# Patient Record
Sex: Female | Born: 1985 | Race: Black or African American | Hispanic: No | Marital: Single | Smoking: Current every day smoker
Health system: Southern US, Community
[De-identification: ages and names within clinical notes are randomized; demographics above are authoritative.]

## PROBLEM LIST (undated history)

## (undated) ENCOUNTER — Inpatient Hospital Stay: Payer: Self-pay

## (undated) DIAGNOSIS — Q433 Congenital malformations of intestinal fixation: Secondary | ICD-10-CM

## (undated) DIAGNOSIS — O26899 Other specified pregnancy related conditions, unspecified trimester: Secondary | ICD-10-CM

## (undated) DIAGNOSIS — F32A Depression, unspecified: Secondary | ICD-10-CM

## (undated) DIAGNOSIS — R0600 Dyspnea, unspecified: Secondary | ICD-10-CM

## (undated) DIAGNOSIS — O0993 Supervision of high risk pregnancy, unspecified, third trimester: Secondary | ICD-10-CM

## (undated) DIAGNOSIS — K8020 Calculus of gallbladder without cholecystitis without obstruction: Secondary | ICD-10-CM

## (undated) DIAGNOSIS — R109 Unspecified abdominal pain: Secondary | ICD-10-CM

## (undated) DIAGNOSIS — F1010 Alcohol abuse, uncomplicated: Secondary | ICD-10-CM

## (undated) DIAGNOSIS — I10 Essential (primary) hypertension: Secondary | ICD-10-CM

## (undated) DIAGNOSIS — I700 Atherosclerosis of aorta: Secondary | ICD-10-CM

## (undated) DIAGNOSIS — E669 Obesity, unspecified: Secondary | ICD-10-CM

## (undated) DIAGNOSIS — Z8482 Family history of sudden infant death syndrome: Secondary | ICD-10-CM

## (undated) DIAGNOSIS — K56609 Unspecified intestinal obstruction, unspecified as to partial versus complete obstruction: Secondary | ICD-10-CM

## (undated) DIAGNOSIS — 419620001 Death: Secondary | SNOMED CT

## (undated) DIAGNOSIS — K430 Incisional hernia with obstruction, without gangrene: Secondary | ICD-10-CM

## (undated) DIAGNOSIS — Z9889 Other specified postprocedural states: Secondary | ICD-10-CM

## (undated) DIAGNOSIS — F209 Schizophrenia, unspecified: Secondary | ICD-10-CM

## (undated) DIAGNOSIS — F329 Major depressive disorder, single episode, unspecified: Secondary | ICD-10-CM

## (undated) DIAGNOSIS — D649 Anemia, unspecified: Secondary | ICD-10-CM

## (undated) DIAGNOSIS — F419 Anxiety disorder, unspecified: Secondary | ICD-10-CM

## (undated) DIAGNOSIS — Z720 Tobacco use: Secondary | ICD-10-CM

## (undated) HISTORY — DX: Unspecified abdominal pain: R10.9

## (undated) HISTORY — DX: Supervision of high risk pregnancy, unspecified, third trimester: O09.93

## (undated) HISTORY — DX: Incisional hernia with obstruction, without gangrene: K43.0

## (undated) HISTORY — DX: Tobacco use: Z72.0

## (undated) HISTORY — PX: TUBAL LIGATION: SHX77

## (undated) HISTORY — DX: Unspecified intestinal obstruction, unspecified as to partial versus complete obstruction: K56.609

## (undated) HISTORY — DX: Death: 419620001

## (undated) HISTORY — DX: Other specified postprocedural states: Z98.890

## (undated) HISTORY — DX: Other specified pregnancy related conditions, unspecified trimester: O26.899

## (undated) HISTORY — DX: Obesity, unspecified: E66.9

## (undated) DEATH — deceased

---
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
## 2005-02-27 ENCOUNTER — Ambulatory Visit: Payer: Self-pay | Admitting: Family Medicine

## 2005-07-27 ENCOUNTER — Inpatient Hospital Stay: Payer: Self-pay

## 2006-01-20 ENCOUNTER — Emergency Department: Payer: Self-pay | Admitting: Emergency Medicine

## 2008-09-29 ENCOUNTER — Emergency Department: Payer: Self-pay | Admitting: Emergency Medicine

## 2010-08-10 ENCOUNTER — Emergency Department: Payer: Self-pay | Admitting: Emergency Medicine

## 2010-09-03 ENCOUNTER — Emergency Department: Payer: Self-pay | Admitting: Unknown Physician Specialty

## 2011-09-22 ENCOUNTER — Ambulatory Visit: Payer: Self-pay | Admitting: Family Medicine

## 2011-12-06 ENCOUNTER — Ambulatory Visit: Payer: Self-pay | Admitting: Family Medicine

## 2012-01-08 ENCOUNTER — Observation Stay: Payer: Self-pay

## 2012-01-08 LAB — URINALYSIS, COMPLETE
Bilirubin,UR: NEGATIVE
Blood: NEGATIVE
Glucose,UR: NEGATIVE mg/dL (ref 0–75)
Leukocyte Esterase: NEGATIVE
Nitrite: NEGATIVE
Protein: NEGATIVE
Squamous Epithelial: 3

## 2012-01-09 LAB — URINE CULTURE

## 2012-03-06 ENCOUNTER — Observation Stay: Payer: Self-pay

## 2012-03-08 ENCOUNTER — Observation Stay: Payer: Self-pay | Admitting: Obstetrics and Gynecology

## 2012-03-08 LAB — URINALYSIS, COMPLETE
Bacteria: NONE SEEN
Blood: NEGATIVE
Glucose,UR: NEGATIVE mg/dL (ref 0–75)
Ketone: NEGATIVE
Nitrite: NEGATIVE
Ph: 7 (ref 4.5–8.0)
Protein: NEGATIVE
RBC,UR: 15 /HPF (ref 0–5)
Squamous Epithelial: 6
WBC UR: 3 /HPF (ref 0–5)

## 2012-03-31 ENCOUNTER — Observation Stay: Payer: Self-pay

## 2012-04-01 ENCOUNTER — Inpatient Hospital Stay: Payer: Self-pay | Admitting: Obstetrics and Gynecology

## 2012-04-01 LAB — CBC WITH DIFFERENTIAL/PLATELET
Basophil #: 0.1 10*3/uL (ref 0.0–0.1)
Eosinophil #: 0.1 10*3/uL (ref 0.0–0.7)
Eosinophil %: 1.2 %
HCT: 34.5 % — ABNORMAL LOW (ref 35.0–47.0)
HGB: 12.1 g/dL (ref 12.0–16.0)
Lymphocyte #: 2.4 10*3/uL (ref 1.0–3.6)
Lymphocyte %: 26.7 %
MCH: 31.4 pg (ref 26.0–34.0)
MCHC: 35.2 g/dL (ref 32.0–36.0)
Monocyte %: 8.1 %
Neutrophil %: 63.4 %
Platelet: 202 10*3/uL (ref 150–440)
RBC: 3.87 10*6/uL (ref 3.80–5.20)
RDW: 14.7 % — ABNORMAL HIGH (ref 11.5–14.5)
WBC: 8.9 10*3/uL (ref 3.6–11.0)

## 2013-07-22 ENCOUNTER — Ambulatory Visit: Payer: Self-pay | Admitting: Family Medicine

## 2013-09-11 DIAGNOSIS — E669 Obesity, unspecified: Secondary | ICD-10-CM

## 2013-09-11 DIAGNOSIS — IMO0002 Reserved for concepts with insufficient information to code with codable children: Secondary | ICD-10-CM | POA: Insufficient documentation

## 2013-09-11 DIAGNOSIS — Z720 Tobacco use: Secondary | ICD-10-CM | POA: Insufficient documentation

## 2013-09-11 DIAGNOSIS — O0993 Supervision of high risk pregnancy, unspecified, third trimester: Secondary | ICD-10-CM | POA: Insufficient documentation

## 2013-09-11 HISTORY — DX: Obesity, unspecified: E66.9

## 2013-09-11 HISTORY — DX: Tobacco use: Z72.0

## 2013-09-11 HISTORY — DX: Supervision of high risk pregnancy, unspecified, third trimester: O09.93

## 2013-10-09 DIAGNOSIS — 419620001 Death: Secondary | SNOMED CT

## 2013-10-09 HISTORY — DX: Death: 419620001

## 2013-10-09 DEATH — deceased

## 2013-11-20 ENCOUNTER — Emergency Department: Payer: Self-pay | Admitting: Internal Medicine

## 2014-04-08 ENCOUNTER — Emergency Department: Payer: Self-pay | Admitting: Emergency Medicine

## 2015-02-16 NOTE — H&P (Signed)
L&D Evaluation:  History:   HPI 29yo G3P1101 (1 IUFD died at 458 mos due to abruption with first preg), then del NSVD viable female without problems of 2nd baby. 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 Presents today for "? ROM". Nitrazine neg on admission and cx exam of 3/80/vtx. After 2 hours monitoring, no cx change. NST reactive.    Presents with leaking fluid    Patient's Medical History Abruption with first preg and IUFD at 36 wks, Anciety, Depression, Anemia, BV, Candidiasis,    Medications Pre Natal Vitamins    Allergies NKDA    Social History tobacco  1ppd   ROS:   ROS All systems were reviewed.  HEENT, CNS, GI, GU, Respiratory, CV, Renal and Musculoskeletal systems were found to be normal.   Exam:   Vital Signs stable  BP stable    General no apparent distress    Mental Status clear    Chest clear    Heart normal sinus rhythm, no murmur/gallop/rubs    Estimated Fetal Weight Average for gestational age    Fetal Position vtx    Back no CVAT    Edema 1+    Reflexes 1+    Clonus negative    Mebranes Intact, No LOF noted. Perineum is dry.    FHT normal rate with no decels    Ucx regular, q 7 to 10 mins, mild    Skin dry    Lymph no lymphadenopathy    Other AFI done on US to see if there is fluid around the fetus. AFI of > 10 noted.  No fluid noted on vag exam and when palpating vtx, no fluid rushing out with balloting the vtx.   Impression:   Impression IUP at term   Plan:   Plan Dc home to rest    Comments FU this week at [facility]/ Return with ROM or any other changes.   Electronic Signatures: Sharee PimpleJones, Yailin Biederman W (CNM)  (Signed 23-Jun-13 20:22)  Authored: L&D Evaluation   Last Updated: 23-Jun-13 20:22 by Sharee PimpleJones, Chara Marquard W (CNM)

## 2015-05-05 ENCOUNTER — Encounter: Payer: Self-pay | Admitting: Emergency Medicine

## 2015-05-05 ENCOUNTER — Emergency Department
Admission: EM | Admit: 2015-05-05 | Discharge: 2015-05-05 | Disposition: A | Discharge status: Home / Self Care | Payer: Medicaid Other | Attending: Emergency Medicine | Admitting: Emergency Medicine

## 2015-05-05 ENCOUNTER — Emergency Department: Payer: Medicaid Other

## 2015-05-05 DIAGNOSIS — M545 Low back pain: Secondary | ICD-10-CM | POA: Diagnosis not present

## 2015-05-05 DIAGNOSIS — O039 Complete or unspecified spontaneous abortion without complication: Secondary | ICD-10-CM | POA: Diagnosis not present

## 2015-05-05 DIAGNOSIS — O9989 Other specified diseases and conditions complicating pregnancy, childbirth and the puerperium: Secondary | ICD-10-CM | POA: Diagnosis not present

## 2015-05-05 DIAGNOSIS — O209 Hemorrhage in early pregnancy, unspecified: Secondary | ICD-10-CM | POA: Diagnosis present

## 2015-05-05 DIAGNOSIS — Z3A12 12 weeks gestation of pregnancy: Secondary | ICD-10-CM | POA: Diagnosis not present

## 2015-05-05 DIAGNOSIS — F17210 Nicotine dependence, cigarettes, uncomplicated: Secondary | ICD-10-CM | POA: Insufficient documentation

## 2015-05-05 LAB — URINALYSIS COMPLETE WITH MICROSCOPIC (ARMC ONLY)
BILIRUBIN URINE: NEGATIVE
Bacteria, UA: NONE SEEN
Glucose, UA: 50 mg/dL — AB
Hgb urine dipstick: 3 — AB
KETONES UR: NEGATIVE mg/dL
NITRITE: NEGATIVE
PROTEIN: 100 mg/dL — AB
RBC / HPF: NONE SEEN RBC/hpf (ref 0–5)
Specific Gravity, Urine: 1.029 (ref 1.005–1.030)
pH: 6 (ref 5.0–8.0)

## 2015-05-05 LAB — COMPREHENSIVE METABOLIC PANEL
ALK PHOS: 45 U/L (ref 38–126)
ALT: 21 U/L (ref 14–54)
AST: 25 U/L (ref 15–41)
Albumin: 3.5 g/dL (ref 3.5–5.0)
Anion gap: 7 (ref 5–15)
BUN: 5 mg/dL — ABNORMAL LOW (ref 6–20)
CO2: 20 mmol/L — AB (ref 22–32)
CREATININE: 0.5 mg/dL (ref 0.44–1.00)
Calcium: 9.2 mg/dL (ref 8.9–10.3)
Chloride: 110 mmol/L (ref 101–111)
GFR calc non Af Amer: >60 mL/min (ref >60)
Glucose, Bld: 106 mg/dL — ABNORMAL HIGH (ref 65–99)
Potassium: 3.9 mmol/L (ref 3.5–5.1)
Sodium: 137 mmol/L (ref 135–145)
TOTAL PROTEIN: 6.8 g/dL (ref 6.5–8.1)
Total Bilirubin: 0.5 mg/dL (ref 0.3–1.2)

## 2015-05-05 LAB — CBC WITH DIFFERENTIAL/PLATELET
BASOS ABS: 0.1 10*3/uL (ref 0–0.1)
Basophils Relative: 1 %
EOS PCT: 2 %
Eosinophils Absolute: 0.2 10*3/uL (ref 0–0.7)
HCT: 38.3 % (ref 35.0–47.0)
Hemoglobin: 12.4 g/dL (ref 12.0–16.0)
LYMPHS ABS: 3 10*3/uL (ref 1.0–3.6)
Lymphocytes Relative: 32 %
MCH: 26.9 pg (ref 26.0–34.0)
MCHC: 32.3 g/dL (ref 32.0–36.0)
MCV: 83.3 fL (ref 80.0–100.0)
Monocytes Absolute: 0.8 10*3/uL (ref 0.2–0.9)
Monocytes Relative: 8 %
Neutro Abs: 5.4 10*3/uL (ref 1.4–6.5)
Neutrophils Relative %: 57 %
Platelets: 315 10*3/uL (ref 150–440)
RBC: 4.6 MIL/uL (ref 3.80–5.20)
RDW: 13.7 % (ref 11.5–14.5)
WBC: 9.3 10*3/uL (ref 3.6–11.0)

## 2015-05-05 LAB — HCG, QUANTITATIVE, PREGNANCY: HCG, BETA CHAIN, QUANT, S: 3694 m[IU]/mL — AB (ref <5)

## 2015-05-05 MED ORDER — IBUPROFEN 800 MG PO TABS: 800.0000 mg | ORAL_TABLET | Freq: Three times a day (TID) | ORAL | Status: DC | PRN

## 2015-05-05 MED ORDER — OXYCODONE-ACETAMINOPHEN 5-325 MG PO TABS: 1.0000 | ORAL_TABLET | Freq: Four times a day (QID) | ORAL | Status: DC | PRN

## 2015-05-05 NOTE — Discharge Instructions (Signed)
Miscarriage A miscarriage is the sudden loss of an unborn baby (fetus) before the 20th week of pregnancy. Most miscarriages happen in the first 3 months of pregnancy. Sometimes, it happens before a woman even knows she is pregnant. A miscarriage is also called a "spontaneous miscarriage" or "early pregnancy loss." Having a miscarriage can be an emotional experience. Talk with your caregiver about any questions you may have about miscarrying, the grieving process, and your future pregnancy plans. CAUSES   Problems with the fetal chromosomes that make it impossible for the baby to develop normally. Problems with the baby's genes or chromosomes are most often the result of errors that occur, by chance, as the embryo divides and grows. The problems are not inherited from the parents.  Infection of the cervix or uterus.   Hormone problems.   Problems with the cervix, such as having an incompetent cervix. This is when the tissue in the cervix is not strong enough to hold the pregnancy.   Problems with the uterus, such as an abnormally shaped uterus, uterine fibroids, or congenital abnormalities.   Certain medical conditions.   Smoking, drinking alcohol, or taking illegal drugs.   Trauma.  Often, the cause of a miscarriage is unknown.  SYMPTOMS   Vaginal bleeding or spotting, with or without cramps or pain.  Pain or cramping in the abdomen or lower back.  Passing fluid, tissue, or blood clots from the vagina. DIAGNOSIS  Your caregiver will perform a physical exam. You may also have an ultrasound to confirm the miscarriage. Blood or urine tests may also be ordered. TREATMENT   Sometimes, treatment is not necessary if you naturally pass all the fetal tissue that was in the uterus. If some of the fetus or placenta remains in the body (incomplete miscarriage), tissue left behind may become infected and must be removed. Usually, a dilation and curettage (D and C) procedure is performed.  During a D and C procedure, the cervix is widened (dilated) and any remaining fetal or placental tissue is gently removed from the uterus.  Antibiotic medicines are prescribed if there is an infection. Other medicines may be given to reduce the size of the uterus (contract) if there is a lot of bleeding.  If you have Rh negative blood and your baby was Rh positive, you will need a Rh immunoglobulin shot. This shot will protect any future baby from having Rh blood problems in future pregnancies. HOME CARE INSTRUCTIONS   Your caregiver may order bed rest or may allow you to continue light activity. Resume activity as directed by your caregiver.  Have someone help with home and family responsibilities during this time.   Keep track of the number of sanitary pads you use each day and how soaked (saturated) they are. Write down this information.   Do not use tampons. Do not douche or have sexual intercourse until approved by your caregiver.   Only take over-the-counter or prescription medicines for pain or discomfort as directed by your caregiver.   Do not take aspirin. Aspirin can cause bleeding.   Keep all follow-up appointments with your caregiver.   If you or your partner have problems with grieving, talk to your caregiver or seek counseling to help cope with the pregnancy loss. Allow enough time to grieve before trying to get pregnant again.  SEEK IMMEDIATE MEDICAL CARE IF:   You have severe cramps or pain in your back or abdomen.  You have a fever.  You pass large blood clots (walnut-sized   or larger) ortissue from your vagina. Save any tissue for your caregiver to inspect.   Your bleeding increases.   You have a thick, bad-smelling vaginal discharge.  You become lightheaded, weak, or you faint.   You have chills.  MAKE SURE YOU:  Understand these instructions.  Will watch your condition.  Will get help right away if you are not doing well or get  worse. Document Released: 03/21/2001 Document Revised: 01/20/2013 Document Reviewed: 11/14/2011 ExitCare Patient Information 2015 [employer]. This information is not intended to replace advice given to you by your health care provider. Make sure you discuss any questions you have with your health care provider.  

## 2015-05-05 NOTE — ED Provider Notes (Signed)
[facility] Emergency Department Provider Note     Time seen: ----------------------------------------- 2:49 PM on 05/05/2015 -----------------------------------------    I have reviewed the triage vital signs and the nursing notes.   HISTORY  Chief Complaint Vaginal Bleeding    HPI [name] is a 29 y.o. female who presents to ER for vaginal bleeding and pregnancy. States she is around [redacted] weeks pregnant, this is her fifth pregnancy, has never had any bleeding before. Started having large amounts of bleeding today with passing clots and having lower abdominal and back pain.Chills or other complaints. Pain in the lower abdomen described as cramping and mild to moderate this time.   No past medical history on file.  There are no active problems to display for this patient.   No past surgical history on file.  Allergies Review of patient's allergies indicates not on file.  Social History History  Substance Use Topics  . Smoking status: Current Every Day Smoker  . Smokeless tobacco: Not on file  . Alcohol Use: Yes    Review of Systems Constitutional: Negative for fever. Eyes: Negative for visual changes. ENT: Negative for sore throat. Cardiovascular: Negative for chest pain. Respiratory: Negative for shortness of breath. Gastrointestinal: Positive for abdominal pain, pelvic pain. Genitourinary: Negative for dysuria. Positive for heavy vaginal bleeding Musculoskeletal: Positive for low back pain Skin: Negative for rash. Neurological: Negative for headaches, focal weakness or numbness.  10-point ROS otherwise negative.  ____________________________________________   PHYSICAL EXAM:  VITAL SIGNS: ED Triage Vitals  Enc Vitals Group     BP 05/05/15 1429 107/65 mmHg     Pulse Rate 05/05/15 1429 98     Resp 05/05/15 1429 18     Temp 05/05/15 1429 98.6 F (37 C)     Temp src --      SpO2 05/05/15 1429 97 %     Weight 05/05/15  1429 270 lb (122.471 kg)     Height 05/05/15 1429  (1.651 m)     Head Cir --      Peak Flow --      Pain Score 05/05/15 1429 0     Pain Loc --      Pain Edu? --      Excl. in GC? --     Constitutional: Alert and oriented. Well appearing and in no distress. Eyes: Conjunctivae are normal. PERRL. Normal extraocular movements. ENT   Head: Normocephalic and atraumatic.   Nose: No congestion/rhinnorhea.   Mouth/Throat: Mucous membranes are moist.   Neck: No stridor. Cardiovascular: Normal rate, regular rhythm. Normal and symmetric distal pulses are present in all extremities. No murmurs, rubs, or gallops. Respiratory: Normal respiratory effort without tachypnea nor retractions. Breath sounds are clear and equal bilaterally. No wheezes/rales/rhonchi. Gastrointestinal: Soft and nontender. No distention. No abdominal bruits. There is no CVA tenderness. Musculoskeletal: Nontender with normal range of motion in all extremities. No joint effusions.  No lower extremity tenderness nor edema. Neurologic:  Normal speech and language. No gross focal neurologic deficits are appreciated. Speech is normal. No gait instability. Skin:  Skin is warm, dry and intact. No rash noted. Psychiatric: Mood and affect are normal. Speech and behavior are normal. Patient exhibits appropriate insight and judgment. ____________________________________________  ED COURSE:  Pertinent labs & imaging results that were available during my care of the patient were reviewed by me and considered in my medical decision making (see chart for details). Patient is in no acute distress, we'll ultrasound and evaluate  her pregnancy. Likely miscarriage ____________________________________________    LABS (pertinent positives/negatives)  Labs Reviewed  HCG, QUANTITATIVE, PREGNANCY - Abnormal; Notable for the following:    hCG, Beta Chain, Quant, S 3694 (*)    All other components within normal limits  URINALYSIS  COMPLETEWITH MICROSCOPIC ([facility] ONLY) - Abnormal; Notable for the following:    Color, Urine COLORLESS (*)    APPearance CLEAR (*)    Glucose, UA 50 (*)    Specific Gravity, Urine 1.051 (*)    Hgb urine dipstick 1+ (*)    All other components within normal limits  COMPREHENSIVE METABOLIC PANEL - Abnormal; Notable for the following:    CO2 20 (*)    Glucose, Bld 106 (*)    BUN 5 (*)    All other components within normal limits  CBC WITH DIFFERENTIAL/PLATELET    RADIOLOGY  Ultrasound OB limited IMPRESSION: 1. The gestational sac is noted within the lower uterine segment/cervical region. No cardiac activity is identified within the embryo. Findings meet definitive criteria for failed pregnancy. This follows SRU consensus guidelines: Diagnostic Criteria for Nonviable Pregnancy Early in the First Trimester. Macy Mis J Med [phone]. 2. Small subchorionic hemorrhage. ____________________________________________  FINAL ASSESSMENT AND PLAN  Miscarriage  Plan: Patient with labs and imaging as dictated above. Patient will be referred to OB/GYN follow-up. Likely pass contents spontaneously. Will be discharged with pain medication to take as needed.   Emily Filbert, MD   Emily Filbert, MD 05/05/15 [phone]

## 2015-05-05 NOTE — ED Notes (Signed)
Pt states she is about 12-[redacted] weeks pregnant, states she started having large amt of vaginal bleeding today, passing large clots, also having pain in lower abd. And back

## 2016-01-19 LAB — OB RESULTS CONSOLE RUBELLA ANTIBODY, IGM: RUBELLA: IMMUNE

## 2016-01-19 LAB — OB RESULTS CONSOLE RPR: RPR: NONREACTIVE

## 2016-01-19 LAB — OB RESULTS CONSOLE GC/CHLAMYDIA
CHLAMYDIA, DNA PROBE: NEGATIVE
GC PROBE AMP, GENITAL: NEGATIVE

## 2016-01-19 LAB — OB RESULTS CONSOLE HEPATITIS B SURFACE ANTIGEN: Hepatitis B Surface Ag: NEGATIVE

## 2016-01-19 LAB — OB RESULTS CONSOLE HIV ANTIBODY (ROUTINE TESTING): HIV: NONREACTIVE

## 2016-01-19 LAB — OB RESULTS CONSOLE VARICELLA ZOSTER ANTIBODY, IGG: VARICELLA IGG: IMMUNE

## 2016-02-28 ENCOUNTER — Observation Stay
Admission: EM | Admit: 2016-02-28 | Discharge: 2016-02-28 | Disposition: A | Discharge status: Home / Self Care | Payer: Medicaid Other | Attending: Obstetrics and Gynecology | Admitting: Obstetrics and Gynecology

## 2016-02-28 ENCOUNTER — Encounter: Payer: Self-pay | Admitting: *Deleted

## 2016-02-28 DIAGNOSIS — F17210 Nicotine dependence, cigarettes, uncomplicated: Secondary | ICD-10-CM | POA: Insufficient documentation

## 2016-02-28 DIAGNOSIS — R300 Dysuria: Secondary | ICD-10-CM | POA: Insufficient documentation

## 2016-02-28 DIAGNOSIS — B9689 Other specified bacterial agents as the cause of diseases classified elsewhere: Secondary | ICD-10-CM | POA: Insufficient documentation

## 2016-02-28 DIAGNOSIS — B373 Candidiasis of vulva and vagina: Secondary | ICD-10-CM | POA: Insufficient documentation

## 2016-02-28 DIAGNOSIS — R109 Unspecified abdominal pain: Secondary | ICD-10-CM

## 2016-02-28 DIAGNOSIS — O26899 Other specified pregnancy related conditions, unspecified trimester: Secondary | ICD-10-CM

## 2016-02-28 DIAGNOSIS — O9989 Other specified diseases and conditions complicating pregnancy, childbirth and the puerperium: Secondary | ICD-10-CM | POA: Insufficient documentation

## 2016-02-28 DIAGNOSIS — Z3A24 24 weeks gestation of pregnancy: Secondary | ICD-10-CM | POA: Insufficient documentation

## 2016-02-28 DIAGNOSIS — O23592 Infection of other part of genital tract in pregnancy, second trimester: Secondary | ICD-10-CM | POA: Insufficient documentation

## 2016-02-28 DIAGNOSIS — O99332 Smoking (tobacco) complicating pregnancy, second trimester: Secondary | ICD-10-CM | POA: Insufficient documentation

## 2016-02-28 DIAGNOSIS — O98812 Other maternal infectious and parasitic diseases complicating pregnancy, second trimester: Principal | ICD-10-CM | POA: Insufficient documentation

## 2016-02-28 HISTORY — DX: Other specified pregnancy related conditions, unspecified trimester: R10.9

## 2016-02-28 HISTORY — DX: Other specified pregnancy related conditions, unspecified trimester: O26.899

## 2016-02-28 LAB — URINALYSIS COMPLETE WITH MICROSCOPIC (ARMC ONLY)
Bilirubin Urine: NEGATIVE
Glucose, UA: NEGATIVE mg/dL
HGB URINE DIPSTICK: NEGATIVE
Nitrite: NEGATIVE
Protein, ur: 30 mg/dL — AB
Specific Gravity, Urine: 1.027 (ref 1.005–1.030)
pH: 6 (ref 5.0–8.0)

## 2016-02-28 LAB — WET PREP, GENITAL
SPERM: NONE SEEN
TRICH WET PREP: NONE SEEN

## 2016-02-28 LAB — CHLAMYDIA/NGC RT PCR (ARMC ONLY)
CHLAMYDIA TR: NOT DETECTED
N gonorrhoeae: NOT DETECTED

## 2016-02-28 MED ORDER — METRONIDAZOLE 0.75 % VA GEL: 1.0000 | Freq: Every day | VAGINAL | Status: DC

## 2016-02-28 MED ORDER — FLUCONAZOLE 150 MG PO TABS: 150.0000 mg | ORAL_TABLET | Freq: Every day | ORAL | Status: DC

## 2016-02-28 MED ORDER — FLUCONAZOLE 50 MG PO TABS
150.0000 mg | ORAL_TABLET | Freq: Once | ORAL | Status: AC
  Administered 2016-02-28: 150 mg via ORAL
  Filled 2016-02-28: qty 3

## 2016-02-28 NOTE — Discharge Summary (Signed)
Discharge instructions reviewed with patient including follow up appointment, prescriptions, and when to seek medical attention. All questions answered. Patient ambulatory with steady gait, no signs of distress observed at time of discharge.

## 2016-02-28 NOTE — OB Triage Provider Note (Signed)
History     CSN: [id number]  Arrival date and time: 02/28/16 1456   None     No chief complaint on file.  HPI  [name] is a 30 yo [id number] at 24+4 weeks by LMP of 09/09/15, with an EDD of 06/15/16.  She presents today with c/o "vaginal burning and burning when I pee" and "feeling like something is right there in my vagina."  She denies vaginal bleeding, but does report tan discharge when she wipes.  She reports some cramping/ctxs.  She endorses good fetal movement and occasional BH ctxs. She reports last bowel movement today and denies constipation, or diarrhea.    Significant OB hx:  02/25/04: IUFD at 8 months for possible placenta abruption 24-Feb-2005: NSVD 6#7 oz - no complications with pregnancy or delivery 02-25-12: NSVD 6#7oz  - infant died at 3 months likely r/t SIDS 02-24-14: NSVD 7#15 - gestational DM 02/25/2015: SAB  History reviewed. No pertinent past medical history.  History reviewed. No pertinent past surgical history.  History reviewed. No pertinent family history.  Social History  Substance Use Topics  . Smoking status: Current Every Day Smoker  . Smokeless tobacco: None  . Alcohol Use: Yes    Allergies: Allergies not on file  Prescriptions prior to admission  Medication Sig Dispense Refill Last Dose  . ibuprofen (ADVIL,MOTRIN) 800 MG tablet Take 1 tablet (800 mg total) by mouth every 8 (eight) hours as needed. 30 tablet 0   . oxyCODONE-acetaminophen (ROXICET) 5-325 MG per tablet Take 1 tablet by mouth every 6 (six) hours as needed. 20 tablet 0     Review of Systems  Constitutional: Negative.   HENT: Negative.   Eyes: Negative.   Respiratory: Negative.   Cardiovascular: Negative.   Gastrointestinal: Positive for abdominal pain.       Ctxs  Genitourinary:       Vaginal burning +tan/white discharge  Musculoskeletal: Negative.   Skin: Negative.   Neurological: Negative.   Endo/Heme/Allergies: Negative.    Physical Exam   Blood pressure 112/64, pulse 87,  temperature 98.1 F (36.7 C), temperature source Oral, resp. rate 18, height  (1.626 m), weight 125.193 kg (276 lb), last menstrual period 09/09/2015, unknown if currently breastfeeding.  Physical Exam  Constitutional: She is oriented to person, place, and time. She appears well-developed and well-nourished.  Cardiovascular: Normal rate and regular rhythm.   Respiratory: Effort normal and breath sounds normal.  GI: Soft.  Genitourinary: Uterus normal.  Gravid, nontender Sterile speculum exam: copious thick white discharge noted in vaginal vault  Cervix appears closed  Dilation: Closed Effacement (%): Thick Exam by:: Joelene Millin, CNM  Musculoskeletal: Normal range of motion.  Neurological: She is alert and oriented to person, place, and time.  Skin: Skin is warm and dry.   Fetal monitoring: 150 bpm/moderate variability  Toco: quiet    Procedures   Assessment and Plan  IUP at 24+4 weeks Vaginal burning in 2nd trimester Vaginal discharge in pregnancy   - Wet prep pending  - GC/CT pending Dysuria in 2nd trimester  - Urinalysis and Urine culture pending Continuous Toco NST Oral hydration until labs result  Karena Addison 02/28/2016, 4:59 PM   Reassessment at 1800: Vulvovaginal candidiasis:  - Diflucan  x 1 now  - Prescription printed for patient to have 1 more tablet to repeat in 1 week if she is still having symptoms  Bacterial vaginosis:   - Metrogel 1 applicator at bedtime x 7 nights   - Prescription  printed for patient Fetal monitoring reassuring for current gestational age  D/C home  Preterm labor and warning s/s reviewed FKC's daily [facility] at [facility] in 1 week for f/u evaluation   Dr. Dalbert GarnetBeasley aware and agrees with plan of care   Carlean JewsMeredith Herndon Grill, CNM

## 2016-02-28 NOTE — Plan of Care (Signed)
Pt seen by meredith sigmon,cnm./ sterile speculum exam done. Will give pt diflucan as ordered and pt to take prescription and get filled for 2nd dose tomorrow. Fetal heart tones obtained. Unable to keep constant tracing due to gestational age and pt's habitus. Will d/c pt home with d/c instructions.

## 2016-03-01 LAB — URINE CULTURE

## 2016-05-11 ENCOUNTER — Inpatient Hospital Stay
Admission: EM | Admit: 2016-05-11 | Discharge: 2016-05-11 | Disposition: A | Discharge status: Home / Self Care | Payer: Medicaid Other | Attending: Obstetrics and Gynecology | Admitting: Obstetrics and Gynecology

## 2016-05-11 ENCOUNTER — Encounter: Payer: Self-pay | Admitting: *Deleted

## 2016-05-11 DIAGNOSIS — R109 Unspecified abdominal pain: Secondary | ICD-10-CM | POA: Insufficient documentation

## 2016-05-11 DIAGNOSIS — Z3A35 35 weeks gestation of pregnancy: Secondary | ICD-10-CM | POA: Insufficient documentation

## 2016-05-11 DIAGNOSIS — M545 Low back pain: Secondary | ICD-10-CM | POA: Diagnosis not present

## 2016-05-11 DIAGNOSIS — O9989 Other specified diseases and conditions complicating pregnancy, childbirth and the puerperium: Secondary | ICD-10-CM | POA: Diagnosis not present

## 2016-05-11 HISTORY — DX: Anxiety disorder, unspecified: F41.9

## 2016-05-11 HISTORY — DX: Major depressive disorder, single episode, unspecified: F32.9

## 2016-05-11 HISTORY — DX: Depression, unspecified: F32.A

## 2016-05-11 NOTE — Discharge Instructions (Signed)
Keep all scheduled appointments.  Call office with any concerns.  Fetal kick count twice a day.

## 2016-05-11 NOTE — Discharge Summary (Signed)
  Patient ID: [name], female   DOB: 1986-04-04, 30 y.o.   MRN: [id number] [name] 27-Jun-1986 G6 P2 [redacted]w[redacted]d presents for cramping  No LOF , no vaginal bleeding ,. Good fetal movt  O;BP (!) 109/48   Pulse 88   Temp 98 F (36.7 C) (Oral)   Resp 18   LMP 09/09/2015  ABDsoft NT  CX closed by RN  NST130 no decels , reactive NST  Labs: none A: cramping 35 week , no labor . Reassuring fetal monitoring  P:cont care at [facility]

## 2016-05-11 NOTE — OB Triage Note (Signed)
[name] pt. To [facility] this morning for low abd cramping and sides.  Right leg  is sore/cramping and low back.  States "I lost my mucous plug last night".

## 2016-06-12 ENCOUNTER — Inpatient Hospital Stay: Payer: Medicaid Other | Admitting: Anesthesiology

## 2016-06-12 ENCOUNTER — Inpatient Hospital Stay
Admission: EM | Admit: 2016-06-12 | Discharge: 2016-06-15 | DRG: 765 | Disposition: A | Discharge status: Home / Self Care | Payer: Medicaid Other | Attending: Obstetrics and Gynecology | Admitting: Obstetrics and Gynecology

## 2016-06-12 ENCOUNTER — Encounter
Admission: EM | Disposition: A | Discharge status: Home / Self Care | Payer: Self-pay | Source: Home / Self Care | Attending: Obstetrics and Gynecology

## 2016-06-12 DIAGNOSIS — O99314 Alcohol use complicating childbirth: Secondary | ICD-10-CM | POA: Diagnosis present

## 2016-06-12 DIAGNOSIS — Z302 Encounter for sterilization: Secondary | ICD-10-CM

## 2016-06-12 DIAGNOSIS — O99334 Smoking (tobacco) complicating childbirth: Secondary | ICD-10-CM | POA: Diagnosis present

## 2016-06-12 DIAGNOSIS — Z3A36 36 weeks gestation of pregnancy: Secondary | ICD-10-CM

## 2016-06-12 DIAGNOSIS — D62 Acute posthemorrhagic anemia: Secondary | ICD-10-CM | POA: Diagnosis present

## 2016-06-12 DIAGNOSIS — F329 Major depressive disorder, single episode, unspecified: Secondary | ICD-10-CM | POA: Diagnosis present

## 2016-06-12 DIAGNOSIS — F17210 Nicotine dependence, cigarettes, uncomplicated: Secondary | ICD-10-CM | POA: Diagnosis present

## 2016-06-12 DIAGNOSIS — Z6841 Body Mass Index (BMI) 40.0 and over, adult: Secondary | ICD-10-CM | POA: Diagnosis not present

## 2016-06-12 DIAGNOSIS — O99344 Other mental disorders complicating childbirth: Secondary | ICD-10-CM | POA: Diagnosis present

## 2016-06-12 DIAGNOSIS — O9902 Anemia complicating childbirth: Secondary | ICD-10-CM | POA: Diagnosis present

## 2016-06-12 DIAGNOSIS — Z9889 Other specified postprocedural states: Secondary | ICD-10-CM

## 2016-06-12 LAB — CBC
HCT: 32.7 % — ABNORMAL LOW (ref 35.0–47.0)
Hemoglobin: 11 g/dL — ABNORMAL LOW (ref 12.0–16.0)
MCH: 26.1 pg (ref 26.0–34.0)
MCHC: 33.7 g/dL (ref 32.0–36.0)
MCV: 77.5 fL — AB (ref 80.0–100.0)
PLATELETS: 310 10*3/uL (ref 150–440)
RBC: 4.22 MIL/uL (ref 3.80–5.20)
RDW: 14.6 % — ABNORMAL HIGH (ref 11.5–14.5)
WBC: 11.3 10*3/uL — ABNORMAL HIGH (ref 3.6–11.0)

## 2016-06-12 SURGERY — Surgical Case
Anesthesia: General | Site: Abdomen | Wound class: Clean

## 2016-06-12 MED ORDER — ROCURONIUM BROMIDE 100 MG/10ML IV SOLN
INTRAVENOUS | Status: DC | PRN
Start: 2016-06-12 — End: 2016-06-12
  Administered 2016-06-12: 30 mg via INTRAVENOUS

## 2016-06-12 MED ORDER — LACTATED RINGERS IV BOLUS (SEPSIS): 500.0000 mL | Freq: Once | INTRAVENOUS | Status: DC

## 2016-06-12 MED ORDER — SUCCINYLCHOLINE CHLORIDE 20 MG/ML IJ SOLN
INTRAMUSCULAR | Status: DC | PRN
  Administered 2016-06-12: 100 mg via INTRAVENOUS

## 2016-06-12 MED ORDER — NEOSTIGMINE METHYLSULFATE 10 MG/10ML IV SOLN
INTRAVENOUS | Status: DC | PRN
  Administered 2016-06-12: 5 mg via INTRAVENOUS

## 2016-06-12 MED ORDER — PROMETHAZINE HCL 25 MG/ML IJ SOLN: 6.2500 mg | INTRAMUSCULAR | Status: DC | PRN

## 2016-06-12 MED ORDER — FENTANYL CITRATE (PF) 100 MCG/2ML IJ SOLN
25.0000 ug | INTRAMUSCULAR | Status: DC | PRN
Start: 2016-06-12 — End: 2016-06-15
  Administered 2016-06-12 – 2016-06-13 (×3): 50 ug via INTRAVENOUS
  Filled 2016-06-12: qty 2

## 2016-06-12 MED ORDER — OXYTOCIN 40 UNITS IN LACTATED RINGERS INFUSION - SIMPLE MED
INTRAVENOUS | Status: DC | PRN
  Administered 2016-06-12: 600 mL via INTRAVENOUS

## 2016-06-12 MED ORDER — FENTANYL CITRATE (PF) 100 MCG/2ML IJ SOLN
INTRAMUSCULAR | Status: DC | PRN
  Administered 2016-06-12 (×2): 100 ug via INTRAVENOUS

## 2016-06-12 MED ORDER — DIPHENHYDRAMINE HCL 50 MG/ML IJ SOLN: 12.5000 mg | Freq: Four times a day (QID) | INTRAMUSCULAR | Status: DC | PRN

## 2016-06-12 MED ORDER — SODIUM CHLORIDE 0.9% FLUSH: 9.0000 mL | INTRAVENOUS | Status: DC | PRN

## 2016-06-12 MED ORDER — DEXAMETHASONE SODIUM PHOSPHATE 10 MG/ML IJ SOLN
INTRAMUSCULAR | Status: DC | PRN
  Administered 2016-06-12: 10 mg via INTRAVENOUS

## 2016-06-12 MED ORDER — ONDANSETRON HCL 4 MG/2ML IJ SOLN
INTRAMUSCULAR | Status: DC | PRN
  Administered 2016-06-12: 4 mg via INTRAVENOUS

## 2016-06-12 MED ORDER — SODIUM CHLORIDE 0.9 % IV SOLN
2.0000 g | Freq: Once | INTRAVENOUS | Status: AC
  Administered 2016-06-12: 2 g via INTRAVENOUS
  Filled 2016-06-12: qty 2000

## 2016-06-12 MED ORDER — MORPHINE SULFATE 2 MG/ML IV SOLN
INTRAVENOUS | Status: DC
  Administered 2016-06-13: 15.28 mg via INTRAVENOUS
  Administered 2016-06-13: 1 mg via INTRAVENOUS
  Administered 2016-06-13: 14 mg via INTRAVENOUS
  Administered 2016-06-13: 14 mL via INTRAVENOUS
  Filled 2016-06-12: qty 25

## 2016-06-12 MED ORDER — GLYCOPYRROLATE 0.2 MG/ML IJ SOLN
INTRAMUSCULAR | Status: DC | PRN
  Administered 2016-06-12: 1 mg via INTRAVENOUS

## 2016-06-12 MED ORDER — LIDOCAINE HCL (CARDIAC) 20 MG/ML IV SOLN
INTRAVENOUS | Status: DC | PRN
  Administered 2016-06-12: 100 mg via INTRAVENOUS

## 2016-06-12 MED ORDER — FENTANYL CITRATE (PF) 100 MCG/2ML IJ SOLN
INTRAMUSCULAR | Status: AC
Start: 2016-06-12 — End: 2016-06-12
  Administered 2016-06-12: 50 ug via INTRAVENOUS
  Filled 2016-06-12: qty 2

## 2016-06-12 MED ORDER — NALOXONE HCL 0.4 MG/ML IJ SOLN: 0.4000 mg | INTRAMUSCULAR | Status: DC | PRN

## 2016-06-12 MED ORDER — ONDANSETRON HCL 4 MG/2ML IJ SOLN: 4.0000 mg | Freq: Four times a day (QID) | INTRAMUSCULAR | Status: DC | PRN

## 2016-06-12 MED ORDER — CEFAZOLIN SODIUM-DEXTROSE 2-4 GM/100ML-% IV SOLN
INTRAVENOUS | Status: AC
  Filled 2016-06-12: qty 100

## 2016-06-12 MED ORDER — PROPOFOL 10 MG/ML IV BOLUS
INTRAVENOUS | Status: DC | PRN
  Administered 2016-06-12: 200 mg via INTRAVENOUS

## 2016-06-12 MED ORDER — SODIUM CHLORIDE FLUSH 0.9 % IV SOLN
INTRAVENOUS | Status: AC
  Filled 2016-06-12: qty 10

## 2016-06-12 MED ORDER — LACTATED RINGERS IV SOLN
INTRAVENOUS | Status: DC
  Administered 2016-06-12: 21:00:00 via INTRAVENOUS

## 2016-06-12 MED ORDER — SODIUM CHLORIDE 0.9 % IV SOLN
INTRAVENOUS | Status: AC
  Filled 2016-06-12: qty 2000

## 2016-06-12 MED ORDER — DIPHENHYDRAMINE HCL 12.5 MG/5ML PO ELIX
12.5000 mg | ORAL_SOLUTION | Freq: Four times a day (QID) | ORAL | Status: DC | PRN
  Filled 2016-06-12: qty 5

## 2016-06-12 MED ORDER — CEFAZOLIN SODIUM-DEXTROSE 2-3 GM-% IV SOLR
INTRAVENOUS | Status: DC | PRN
  Administered 2016-06-12: 2 g via INTRAVENOUS

## 2016-06-12 SURGICAL SUPPLY — 26 items
BARRIER ADHS 3X4 INTERCEED (GAUZE/BANDAGES/DRESSINGS) ×3 IMPLANT
CANISTER SUCT 3000ML (MISCELLANEOUS) ×3 IMPLANT
CATH KIT ON-Q SILVERSOAK 5IN (CATHETERS) ×6 IMPLANT
CHLORAPREP W/TINT 26ML (MISCELLANEOUS) ×3 IMPLANT
DRSG TELFA 3X8 NADH (GAUZE/BANDAGES/DRESSINGS) ×3 IMPLANT
ELECT CAUTERY BLADE 6.4 (BLADE) ×3 IMPLANT
ELECT REM PT RETURN 9FT ADLT (ELECTROSURGICAL) ×3
ELECTRODE REM PT RTRN 9FT ADLT (ELECTROSURGICAL) ×1 IMPLANT
GAUZE SPONGE 4X4 12PLY STRL (GAUZE/BANDAGES/DRESSINGS) ×3 IMPLANT
GLOVE BIO SURGEON STRL SZ8 (GLOVE) ×3 IMPLANT
GOWN STRL REUS W/ TWL LRG LVL3 (GOWN DISPOSABLE) ×2 IMPLANT
GOWN STRL REUS W/ TWL XL LVL3 (GOWN DISPOSABLE) ×1 IMPLANT
GOWN STRL REUS W/TWL LRG LVL3 (GOWN DISPOSABLE) ×4
GOWN STRL REUS W/TWL XL LVL3 (GOWN DISPOSABLE) ×2
NS IRRIG 1000ML POUR BTL (IV SOLUTION) ×3 IMPLANT
PACK C SECTION AR (MISCELLANEOUS) ×3 IMPLANT
PAD OB MATERNITY 4.3X12.25 (PERSONAL CARE ITEMS) ×3 IMPLANT
PAD PREP 24X41 OB/GYN DISP (PERSONAL CARE ITEMS) ×3 IMPLANT
STAPLER INSORB 30 2030 C-SECTI (MISCELLANEOUS) ×3 IMPLANT
STRAP SAFETY BODY (MISCELLANEOUS) ×3 IMPLANT
SUT CHROMIC 1 CTX 36 (SUTURE) ×9 IMPLANT
SUT PLAIN 2 0 XLH (SUTURE) ×6 IMPLANT
SUT PLAIN GUT 0 (SUTURE) ×9 IMPLANT
SUT VIC AB 0 CT1 36 (SUTURE) ×6 IMPLANT
TUBE CONNECTING  6'X3/16 (MISCELLANEOUS) ×1
TUBE CONNECTING 6X3/16 (MISCELLANEOUS) ×2 IMPLANT

## 2016-06-12 NOTE — H&P (Signed)
[name] is a 30 y.o. female presenting for 36+6 week for srom at 2000 tonight . Meconium noted . Pregnancy complicated by Obesity , poor PN care , tobacco use , etoh use , depression , anemia  . OB History    Gravida Para Term Preterm AB Living   6 4 3 1 1 2    SAB TAB Ectopic Multiple Live Births   1 0 0 0 1     Past Medical History:  Diagnosis Date  . Anxiety   . Depression    History reviewed. No pertinent surgical history. Family History: family history is not on file. Social History:  reports that she has been smoking Cigarettes.  She has been smoking about 0.50 packs per day. She uses smokeless tobacco. She reports that she drinks about 4.2 oz of alcohol per week . She reports that she does not use drugs.     Maternal Diabetes: No Genetic Screening: too late  Maternal Ultrasounds/Referrals: Normal Fetal Ultrasounds or other Referrals:  None Maternal Substance Abuse:  Yes:  Type: Smoker, Other: ETOH  Significant Maternal Medications:  None Significant Maternal Lab Results:  None Other Comments:  None  ROS History Dilation: 5 Effacement (%): 80 Station: -2 Exam by:: TS, MD Last menstrual period 09/09/2015, unknown if currently breastfeeding. Exam  Cord presenting  Physical Exam   Lungs CTA  CV RRR efm: + variable decel ,  Prenatal labs: ABO, Rh:   Antibody:  A+ Rubella:  Imm RPR:   NR HBsAg:  neg  HIV:  Neg   GBS:  unknown    Assessment/Plan: Prolapse cord  Stat c/s    [name] 06/12/2016, 10:13 PM

## 2016-06-12 NOTE — Anesthesia Procedure Notes (Signed)
Procedure Name: Intubation Date/Time: 06/12/2016 10:44 PM Performed by: Lenard SimmerKARENZ, Fredrick Geoghegan Pre-anesthesia Checklist: Patient identified, Emergency Drugs available, Suction available, Patient being monitored and Timeout performed Patient Re-evaluated:Patient Re-evaluated prior to inductionOxygen Delivery Method: Circle system utilized Preoxygenation: Pre-oxygenation with 100% oxygen Intubation Type: IV induction, Rapid sequence and Cricoid Pressure applied Laryngoscope Size: Mac and 3 Grade View: Grade I Tube type: Oral Tube size: 7.5 mm Number of attempts: 1 Placement Confirmation: ETT inserted through vocal cords under direct vision,  positive ETCO2 and breath sounds checked- equal and bilateral Secured at: 22 cm Tube secured with: Tape Dental Injury: Teeth and Oropharynx as per pre-operative assessment

## 2016-06-12 NOTE — Transfer of Care (Signed)
Immediate Anesthesia Transfer of Care Note  Patient: [name]  Procedure(s) Performed: Procedure(s) with comments: STAT CESAREAN SECTION WITH BILATERAL STERILIZATION (N/A) - Baby Girl @ 2246 Weight 5 lb 10 oz Apgars 8/9  Patient Location: PACU  Anesthesia Type:General  Level of Consciousness: awake, alert  and oriented  Airway & Oxygen Therapy: Patient Spontanous Breathing  Post-op Assessment: Post -op Vital signs reviewed and stable  Post vital signs: stable  Last Vitals:  Vitals:   06/12/16 2337 06/12/16 2338  BP: (!) 138/110 (!) 138/110  Pulse: (!) 105 (!) 108  Resp: 20 17  Temp: 36.8 C 36.8 C    Last Pain:  Vitals:   06/12/16 2338  TempSrc: Oral  PainSc:          Complications: No apparent anesthesia complications

## 2016-06-12 NOTE — Op Note (Signed)
[name], [name] NO.:  [id number]  MEDICAL RECORD NO.:  [id number]  LOCATION:  LDR3                         FACILITY:  [facility]  PHYSICIAN:  Jennell Corner, MDDATE OF BIRTH:  06/16/1986  DATE OF PROCEDURE:  06/12/2016 DATE OF DISCHARGE:                              OPERATIVE REPORT   PREOPERATIVE DIAGNOSIS: 1. 36+ 6 weeks' estimated gestational age. 2. Cord prolapse while in labor. 3. Elective permanent sterilization.  POSTOPERATIVE DIAGNOSIS: 1. 36+ 6 weeks' estimated gestational age. 2. Cord prolapse while in labor. 3. Elective permanent sterilization.  PROCEDURE: 1. Emergent primary low transverse cesarean section. 2. Bilateral tubal ligation.  ANESTHESIA:  General endotracheal anesthesia.  SURGEON:  Jennell Corner, MD  FIRST ASSISTANT:  Scrub tech.  INDICATION:  A 30 year old, gravida 6, para 4 patient admitted to Labor and Delivery in active labor with spontaneous rupture of membranes.  The patient had a forebag which was ruptured.  Meconium staining was noted. An IUPC was placed, which did not work.  On re-evaluation of the patient after a variable decel was noted.  Umbilical cord was presenting in front of the presenting head.  Therefore, the nurse was instructed to keep a vaginal hand present to elevate the head to keep it off the umbilical cord.  Stat cesarean section was called for.  The patient was emergently taken back to the operating room.  2 g of IV Ancef were given.  DESCRIPTION OF PROCEDURE:  After the patient was prepped and draped in normal sterile fashion, general endotracheal anesthesia was administered.  Once the patient was intubated, a Pfannenstiel incision was made 2 fingerbreadths above the symphysis pubis.  Sharp dissection was used to identify the fascia.  Fascia was opened in the midline and opened in a transverse fashion.  Peritoneal cavity was opened sharply and the lower uterine segment was incised with a  scalpel.  Upon entry into the endometrial cavity, the incision was extended with blunt transverse traction.  Fetal head was brought to the incision.  The head was delivered followed by reduction of a loose nuchal cord.  The shoulders and body of the infant were delivered female.  Cord was doubly clamped and passed to the nursery staff.  Delivery time 2246.  Apgars were assigned to the female infant, 8 and 9.  Cord gas was obtained. Results pending.  The placenta was then manually delivered and exteriorized and then the uterus was exteriorized.  The endometrial cavity was wiped clean with laparotomy tape.  Uterine incision was then closed with a running 1 chromic suture.  Good approximation of edges. Good hemostasis noted.  Separate figure-of-eight used on the right lateral side.  Good hemostasis noted.  Attention was directed to the patient's right fallopian tube which was grasped at the midportion of the fallopian tube.  Two separate 0 plain gut sutures were applied to the fallopian tube and a 1.5 cm portion of fallopian tube was removed. Similar procedure was repeated on the patient's left fallopian tube and 2 separate 0 plain gut sutures were applied to the fallopian tube in the midportion and 1.5 cm portion of fallopian tube removed.  Good hemostasis noted.  Posterior cul-de-sac was irrigated and suctioned  and the uterus was placed back in the abdominal cavity.  The pericolic gutters were wiped clean with laparotomy tape.  The tubal ligation sites appeared hemostatic.  Uterine incision appeared hemostatic.  The fascia was then closed over top with a 0 Vicryl suture in a running nonlocking fashion.  Good approximation of edges.  Subcutaneous tissues were irrigated and bovied for hemostasis and given the depth of the subcutaneous tissue approximately 3.5 cm, a 2-0 chromic suture was used to close the dead space, and the skin was reapproximated with Insorb absorbable staples.  Good  cosmetic effect.  There were no complications.  ESTIMATED BLOOD LOSS:  600 mL.  INTRAOPERATIVE FLUID:  700 mL.  The patient was taken to recovery room in good condition.          ______________________________ Jennell Cornerhomas Kenley Troop, MD     TS/MEDQ  D:  06/12/2016  T:  06/12/2016  Job:  [id number]

## 2016-06-12 NOTE — Anesthesia Preprocedure Evaluation (Signed)
Anesthesia Evaluation  Patient identified by MRN, date of birth, ID band Patient awake    Reviewed: Allergy & Precautions, H&P , NPO status , Patient's Chart, lab work & pertinent test results, reviewed documented beta blocker date and time   History of Anesthesia Complications Negative for: history of anesthetic complications  Airway Mallampati: III  TM Distance: >3 FB Neck ROM: full    Dental no notable dental hx. (+) Teeth Intact   Pulmonary Current Smoker,           Cardiovascular Exercise Tolerance: Good negative cardio ROS       Neuro/Psych PSYCHIATRIC DISORDERS (Depression and anxiety) negative neurological ROS     GI/Hepatic negative GI ROS, Neg liver ROS,   Endo/Other  Morbid obesity  Renal/GU negative Renal ROS  negative genitourinary   Musculoskeletal   Abdominal   Peds  Hematology negative hematology ROS (+)   Anesthesia Other Findings Past Medical History: No date: Anxiety No date: Depression  Emergent c-section secondary to prolapsed cord.  Reproductive/Obstetrics (+) Pregnancy                             Anesthesia Physical Anesthesia Plan  ASA: III  Anesthesia Plan: General, Rapid Sequence and Cricoid Pressure   Post-op Pain Management:    Induction:   Airway Management Planned:   Additional Equipment:   Intra-op Plan:   Post-operative Plan:   Informed Consent: I have reviewed the patients History and Physical, chart, labs and discussed the procedure including the risks, benefits and alternatives for the proposed anesthesia with the patient or authorized representative who has indicated his/her understanding and acceptance.   Dental Advisory Given  Plan Discussed with: Anesthesiologist, CRNA and Surgeon  Anesthesia Plan Comments:         Anesthesia Quick Evaluation

## 2016-06-12 NOTE — Brief Op Note (Signed)
06/12/2016  delivery 9/4/ 2017 @ 2246  11:19 PM  PATIENT:  Coralee NorthAshley R Kina  30 y.o. female  PRE-OPERATIVE DIAGNOSIS: 36+6 weeks  Cord prolapse in labor   elective permanent sterilization   POST-OPERATIVE DIAGNOSIS:  Same  vigorous female  Apgars 8/9   PROCEDURE:  Procedure(s) with comments: STAT CESAREAN SECTION WITH BILATERAL STERILIZATION (N/A) - Baby Girl @ 2246 Weight 5 lb 10 oz Apgars 8/9  Low transverse cesarean section   SURGEON:  Surgeon(s) and Role:    Suzy Bouchard* Senai Kingsley J Riven Mabile, MD - Primary  PHYSICIAN ASSISTANT: scrub tech   ASSISTANTS: none   ANESTHESIA:  GETA EBL:  600 cc IOF 700  2 gm ancef IV given prior   BLOOD ADMINISTERED:none  DRAINS: Urinary Catheter (Foley)   LOCAL MEDICATIONS USED:  NONE  SPECIMEN:  Source of Specimen:  portion right and left tube, placenta  DISPOSITION OF SPECIMEN:  PATHOLOGY  COUNTS:  YES  TOURNIQUET:  * No tourniquets in log *  DICTATION: .Other Dictation: Dictation Number verbal   PLAN OF CARE: Admit to inpatient   PATIENT DISPOSITION:  PACU - hemodynamically stable.   Delay start of Pharmacological VTE agent (>24hrs) due to surgical blood loss or risk of bleeding: not applicable

## 2016-06-13 ENCOUNTER — Encounter: Payer: Self-pay | Admitting: Obstetrics and Gynecology

## 2016-06-13 DIAGNOSIS — Z9889 Other specified postprocedural states: Secondary | ICD-10-CM

## 2016-06-13 HISTORY — DX: Other specified postprocedural states: Z98.890

## 2016-06-13 LAB — CBC
HCT: 31.2 % — ABNORMAL LOW (ref 35.0–47.0)
Hemoglobin: 10.4 g/dL — ABNORMAL LOW (ref 12.0–16.0)
MCH: 27 pg (ref 26.0–34.0)
MCHC: 33.4 g/dL (ref 32.0–36.0)
MCV: 80.9 fL (ref 80.0–100.0)
PLATELETS: 282 10*3/uL (ref 150–440)
RBC: 3.86 MIL/uL (ref 3.80–5.20)
RDW: 14.8 % — AB (ref 11.5–14.5)
WBC: 20.7 10*3/uL — ABNORMAL HIGH (ref 3.6–11.0)

## 2016-06-13 LAB — TYPE AND SCREEN
ABO/RH(D): A POS
ANTIBODY SCREEN: NEGATIVE

## 2016-06-13 MED ORDER — COCONUT OIL OIL: 1.0000 "application " | TOPICAL_OIL | Status: DC | PRN

## 2016-06-13 MED ORDER — OXYCODONE-ACETAMINOPHEN 5-325 MG PO TABS
2.0000 | ORAL_TABLET | ORAL | Status: DC | PRN
  Administered 2016-06-13 – 2016-06-15 (×4): 2 via ORAL
  Filled 2016-06-13 (×6): qty 2

## 2016-06-13 MED ORDER — ZOLPIDEM TARTRATE 5 MG PO TABS: 5.0000 mg | ORAL_TABLET | Freq: Every evening | ORAL | Status: DC | PRN

## 2016-06-13 MED ORDER — LACTATED RINGERS IV SOLN: INTRAVENOUS | Status: DC

## 2016-06-13 MED ORDER — DIBUCAINE 1 % RE OINT: 1.0000 "application " | TOPICAL_OINTMENT | RECTAL | Status: DC | PRN

## 2016-06-13 MED ORDER — OXYCODONE-ACETAMINOPHEN 5-325 MG PO TABS
1.0000 | ORAL_TABLET | ORAL | Status: DC | PRN
  Administered 2016-06-14 – 2016-06-15 (×5): 1 via ORAL
  Filled 2016-06-13 (×3): qty 1

## 2016-06-13 MED ORDER — IBUPROFEN 600 MG PO TABS
600.0000 mg | ORAL_TABLET | Freq: Four times a day (QID) | ORAL | Status: DC
  Administered 2016-06-13 – 2016-06-15 (×11): 600 mg via ORAL
  Filled 2016-06-13 (×11): qty 1

## 2016-06-13 MED ORDER — OXYTOCIN 40 UNITS IN LACTATED RINGERS INFUSION - SIMPLE MED
2.5000 [IU]/h | INTRAVENOUS | Status: AC
  Filled 2016-06-13: qty 1000

## 2016-06-13 MED ORDER — WITCH HAZEL-GLYCERIN EX PADS: 1.0000 "application " | MEDICATED_PAD | CUTANEOUS | Status: DC | PRN

## 2016-06-13 MED ORDER — SIMETHICONE 80 MG PO CHEW
80.0000 mg | CHEWABLE_TABLET | Freq: Three times a day (TID) | ORAL | Status: DC
  Administered 2016-06-13 – 2016-06-15 (×6): 80 mg via ORAL
  Filled 2016-06-13 (×7): qty 1

## 2016-06-13 MED ORDER — FERROUS SULFATE 325 (65 FE) MG PO TABS
325.0000 mg | ORAL_TABLET | Freq: Every day | ORAL | Status: DC
  Administered 2016-06-13 – 2016-06-15 (×3): 325 mg via ORAL
  Filled 2016-06-13 (×3): qty 1

## 2016-06-13 MED ORDER — SIMETHICONE 80 MG PO CHEW
80.0000 mg | CHEWABLE_TABLET | ORAL | Status: DC | PRN
  Administered 2016-06-15: 80 mg via ORAL
  Filled 2016-06-13 (×2): qty 1

## 2016-06-13 MED ORDER — IBUPROFEN 600 MG PO TABS
ORAL_TABLET | ORAL | Status: AC
  Administered 2016-06-13: 600 mg via ORAL
  Filled 2016-06-13: qty 1

## 2016-06-13 MED ORDER — SIMETHICONE 80 MG PO CHEW
80.0000 mg | CHEWABLE_TABLET | ORAL | Status: DC
  Administered 2016-06-13 – 2016-06-15 (×2): 80 mg via ORAL
  Filled 2016-06-13: qty 1

## 2016-06-13 MED ORDER — PRENATAL MULTIVITAMIN CH
1.0000 | ORAL_TABLET | Freq: Every day | ORAL | Status: DC
Start: 2016-06-13 — End: 2016-06-15
  Administered 2016-06-13 – 2016-06-15 (×3): 1 via ORAL
  Filled 2016-06-13 (×3): qty 1

## 2016-06-13 MED ORDER — ACETAMINOPHEN 325 MG PO TABS
650.0000 mg | ORAL_TABLET | ORAL | Status: DC | PRN
  Administered 2016-06-13: 650 mg via ORAL
  Filled 2016-06-13: qty 2

## 2016-06-13 MED ORDER — TETANUS-DIPHTH-ACELL PERTUSSIS 5-2.5-18.5 LF-MCG/0.5 IM SUSP: 0.5000 mL | Freq: Once | INTRAMUSCULAR | Status: DC

## 2016-06-13 MED ORDER — MENTHOL 3 MG MT LOZG
1.0000 | LOZENGE | OROMUCOSAL | Status: DC | PRN
  Filled 2016-06-13: qty 9

## 2016-06-13 MED ORDER — OXYTOCIN 40 UNITS IN LACTATED RINGERS INFUSION - SIMPLE MED
INTRAVENOUS | Status: AC
  Filled 2016-06-13: qty 1000

## 2016-06-13 MED ORDER — DIPHENHYDRAMINE HCL 25 MG PO CAPS: 25.0000 mg | ORAL_CAPSULE | Freq: Four times a day (QID) | ORAL | Status: DC | PRN

## 2016-06-13 MED ORDER — SENNOSIDES-DOCUSATE SODIUM 8.6-50 MG PO TABS
2.0000 | ORAL_TABLET | ORAL | Status: DC
  Administered 2016-06-13 – 2016-06-15 (×2): 2 via ORAL
  Filled 2016-06-13 (×2): qty 2

## 2016-06-13 NOTE — Progress Notes (Signed)
POSTOPERATIVE DAY # 1 S/P Primary LTCS for cord prolapse and BTL    S:         Reports feeling okay, but having some pain around incision              Tolerating po intake / no nausea / no vomiting / no flatus / no BM             Bleeding is light             Using PCA for pain, but having some breakthrough pain              Bedrest/ foley catheter in place and draining well   Newborn formula feeding   O:  VS: BP (!) 121/56   Pulse 64   Temp 98.1 F (36.7 C) (Oral)   Resp 20   Ht 5\' 4"  (1.626 m)   Wt 125.2 kg (276 lb)   LMP 09/09/2015   SpO2 100%   Breastfeeding? Unknown   BMI 47.38 kg/m    LABS:               Recent Labs  06/12/16 2129 06/13/16 0548  WBC 11.3* 20.7*  HGB 11.0* 10.4*  PLT 310 282               Bloodtype: --/--/A POS (09/04 2200)  Rubella: Immune (04/12 0000)                                             I&O: Intake/Output      09/04 0701 - 09/05 0700 09/05 0701 - 09/06 0700   I.V. (mL/kg) 400 (3.2)    Total Intake(mL/kg) 400 (3.2)    Urine (mL/kg/hr) 400    Blood 600    Total Output 1000     Net -600                       Physical Exam:             Alert and Oriented X3  Lungs: Clear and unlabored  Heart: regular rate and rhythm / no mumurs  Abdomen: soft, non-tender, non-distended, hypoactive bowel sounds in all 4 quadrants             Fundus: firm, non-tender, U-E             Dressing: c/d/i             Incision: approximated with Insorb staples - unable to visualize d/t dressing  Perineum: intact  Lochia: scant  Extremities: mild +1 edema, no calf pain or tenderness, SCDs in place  A:        POD # 1 S/P Primary LTCS for cord prolapse and BTL   Mild ABL Anemia  P:        Routine postoperative care              Begin Ferrous Sulfate daily   D/C foley this afternoon  Up with assistance   Transition over to PO pain medication today  Continue routine care  Carlean JewsMeredith Sigmon, CNM

## 2016-06-13 NOTE — Progress Notes (Signed)
Patient ID: [name], female   DOB: 1986/08/24, 30 y.o.   MRN: [id number] Imaging Results - in this encounter   US OB Targeted 2nd/3rd Tri Single Gestation (03/07/2016 3:54 PM) US OB Targeted 2nd/3rd Tri Single Gestation (03/07/2016 3:54 PM)  Specimen Performing Laboratory   [facility]  [address]  [place], [place] [address]    US OB Targeted 2nd/3rd Tri Single Gestation (03/07/2016 3:54 PM)  Narrative  Obstetric Ultrasound Report  Detailed Survey     Referral from:   . [facility] [place]  [facility]  [address] [facility], [id number] Department of Obstetrics & Gynecology  Phone: [phone], [id number]  Fax: [phone] [phone]  [phone]  [phone]  ---------------------------------------------------------------------------  PATIENT INFORMATION:   Name: Katherine JOHNSONMR#: [id number]   Age:72 y/oExam Date: 03/07/2016   DOB:November 15, 1985 Visit #: 1   SAY:[id number] Location:[facility]   # Fetuses: 1        INDICATIONS: Anatomical Survey, Previous Intrauterine Fetal Demise,   Obesity, smoking     ---------------------------------------------------------------------------  DATING:  Assigned GA   GA by LMPGA by US(US)EDD  NA 23 0/7 wks 23 0/7 wks9/26/17     BIOMETRY:   BPD: 55.4 mm22 6/7 wksHC:199.0 mm22 0/7 wks(8%)   (45%)   Femur: 43.3 mm24 1/7 wksAC:195.3 mm24 2/7 wks(77%)  (73%)   Ceph Index:78(74 - 83)   EFW: 668 gms1 lbs 8 oz (61%)   Lat Ventricles: 4.19 mmCerebellum: 25.9 mm   Cisterna Magna: 6.31 mmFetal Heart Rate: 148 bpm   FL/AC:0.22 HL/BPD: 0.72    FL/BPD: 0.78 Humerus:40.3 mm24 3  (76%)     PRESENTATION/CORD/PLACENTA/FLUID/CERVIX:    Presentation:  Breech   Umbilical Cord: 3 Vessel Cord.Normal insertion into the placenta.    Placenta: Anterior.There Is No Evidence Of Placenta Previa.    Amniotic Fluid: Maximum Vertical Pocket = 5.7 cm.    Cervix: Overall Length:31mm, Normal      FETAL ANATOMICAL SURVEY:    NORMAL   ------   Head: Calvarium, Intracranial Anatomy, Lateral   Ventricles, Cerebellum, Choroid Plexus, Cisterna   Magna, Midline Falx, Cavum Septum Pellucidum   Neck: Neck Anatomy   Face: Profile, Orbits, Face, Nose, Lips    Thorax: Four Chamber View, RVOT, LVOT, Three Vessel View,    Aortic Arch, Cardiac Axis, Cardiac Position, Fetal   Heart Rate, Lungs, Diaphragm   Abdomen:Ventral Wall, Stomach, Kidney - Left, Kidney -   Right, Bladder, Bowel, Genitalia (Female)    Upper Extremities:Humerus - Left, Humerus - Right, Forearm - Left,   Forearm - Right    Lower Extremities:Femur - Left, Femur - Right, Lower Leg - Left,   Lower Leg - Right, Foot - Left, Foot - Right       SUBOPTIMAL   ----------   Spine:All Suboptimal   Upper Extremities:Hand - Left, Hand - Right, Fingers - Left, Fingers   - Right    Lower Extremities:Toes - Left, Toes - Right             EFW Summary Table    Exam DateFetus #EFW Percentile   ------------------------------   5/30/171 23557 %     AMNIOTIC FLUID VOLUME:      CERVIX: Visualized    Overall Length:46 mm        ---------------------------------------------------------------------------  COMMENTS:   Impression:    COMPREHENSIVE STUDY  Comprehensive ultrasound was performed in view of the increased risk for   fetal pathology as documented in the indications and/or counseling   visit.(Obesity)       A single viable intrauterine pregnancy is identified with measurements   compatible with gestational age that was established today due to    uncertain LMP.      Detailed anatomic evaluation of the fetal brain/ventricles, face,    heart/outflow tracts and chest anatomy, abdominal organ specific   anatomy, number/length/architecture of limbs and detailed evaluation of    the umbilical cord and placenta and other fetal anatomy as clinically    indicated was performed; within the limitations of ultrasound, no fetal    anomalies were seen.      Spine is suboptimal due to body habitus and position.       Transabdominal cervical length was greater than or equal to 35mm   (normal).       Thank you for this referral.Follow-up in 4 weeks to complete spine and   to evaluate growth given dated by today's ultrasound.

## 2016-06-14 LAB — CBC
HCT: 27.1 % — ABNORMAL LOW (ref 35.0–47.0)
Hemoglobin: 9.1 g/dL — ABNORMAL LOW (ref 12.0–16.0)
MCH: 26.2 pg (ref 26.0–34.0)
MCHC: 33.7 g/dL (ref 32.0–36.0)
MCV: 77.8 fL — AB (ref 80.0–100.0)
PLATELETS: 279 10*3/uL (ref 150–440)
RBC: 3.49 MIL/uL — AB (ref 3.80–5.20)
RDW: 14.9 % — AB (ref 11.5–14.5)
WBC: 13.6 10*3/uL — ABNORMAL HIGH (ref 3.6–11.0)

## 2016-06-14 LAB — SURGICAL PATHOLOGY

## 2016-06-14 LAB — RPR: RPR Ser Ql: NONREACTIVE

## 2016-06-14 NOTE — Progress Notes (Signed)
Subjective: Postpartum Day 2: Cesarean Delivery Patient reports incisional pain.   On percocet Objective: Vital signs in last 24 hours: Temp:  [97.5 F (36.4 C)-98.6 F (37 C)] 97.8 F (36.6 C) (09/06 0754) Pulse Rate:  [58-74] 74 (09/06 0754) Resp:  [18-20] 20 (09/06 0754) BP: (106-120)/(49-83) 112/49 (09/06 0754) SpO2:  [97 %-100 %] 99 % (09/06 0754)  Physical Exam:  General: alert and cooperative Lochia: appropriate Uterine Fundus: firm Incision: healing well, no significant drainage DVT Evaluation: No evidence of DVT seen on physical exam. Lungs CTA  CV RRR  Recent Labs  06/12/16 2129 06/13/16 0548  HGB 11.0* 10.4*  HCT 32.7* 31.2*    Assessment/Plan: Status post Cesarean section. Doing well postoperatively.  Continue current care.  SCHERMERHORN,THOMAS 06/14/2016, 8:55 AM

## 2016-06-14 NOTE — Clinical Social Work Maternal (Signed)
  CLINICAL SOCIAL WORK MATERNAL/CHILD NOTE  Patient Details  Name: [name] MRN: [id number] Date of Birth: Sep 03, 1986  Date:  06/14/2016  Clinical Social Worker Initiating Note:  Shela Leff MSW,LCSW Date/ Time Initiated:  06/14/16/0830     Child's Name:      Legal Guardian:  Mother   Need for Interpreter:  None   Date of Referral:  06/12/16     Reason for Referral:  Late or No Prenatal Care , Current Substance Use/Substance Use During Pregnancy    Referral Source:  RN   Address:     Phone number:      Household Members:  Self, Minor Children, Significant Other   Natural Supports (not living in the home):      Professional Supports: None   Employment:     Type of Work:     Education:      Pensions consultant:  [employer]   Other Resources:  [employer]   Cultural/Religious Considerations Which May Impact Care:  none  Strengths:  Ability to meet basic needs , Home prepared for child    Risk Factors/Current Problems:  Substance Use    Cognitive State:  Alert    Mood/Affect:  Calm , Relaxed    CSW Assessment: CSW met with patient this morning. Newborn in the bassinet by bedside and father of baby asleep in additional bed in room. Patient informed CSW that in the home will be her other two minor children, her significant other and her newborn. Patient reports that she has all necessities for her newborn and has transportation with no concerns. Patient admits to receiving late prenatal care which she states began in her 2nd trimester and states that she simply got tired of the phone ringing and ringing and did not call back frequently. Patient admits to drinking 2 beers a day during her pregnancy. CSW reiterated that affects of alcohol on infant may not show up until later in development. CSW asked if she felt as though she had an addiction to alcohol and patient denies this. Patient states that she does not drink the beers until after everyone is asleep at night.  Patient does not believe that the two beers a day she has hinders her ability to provide care. Patient had a negative urine drug screen and denies any other illicit use. Patient confirms history of depression but states that this has not been a recent concern. CSW educated patient regarding postpartum depression and signs and symptoms and how to utilize resources around her.   CSW Plan/Description:  Patient/Family Education     [place], [place] 06/14/2016, 8:32 AM

## 2016-06-14 NOTE — Anesthesia Postprocedure Evaluation (Signed)
Anesthesia Post Note  Patient: [name]  Procedure(s) Performed: Procedure(s) (LRB): STAT CESAREAN SECTION WITH BILATERAL STERILIZATION (N/A)  Patient location during evaluation: PACU Anesthesia Type: General Level of consciousness: awake and alert Pain management: pain level controlled Vital Signs Assessment: post-procedure vital signs reviewed and stable Respiratory status: spontaneous breathing, nonlabored ventilation, respiratory function stable and patient connected to nasal cannula oxygen Cardiovascular status: blood pressure returned to baseline and stable Postop Assessment: no signs of nausea or vomiting Anesthetic complications: no    Last Vitals:  Vitals:   06/14/16 0754 06/14/16 1131  BP: (!) 112/49 (!) 105/57  Pulse: 74 80  Resp: 20 18  Temp: 36.6 C 36.7 C    Last Pain:  Vitals:   06/14/16 1131  TempSrc: Oral  PainSc:                  [name]

## 2016-06-15 LAB — CULTURE, BETA STREP (GROUP B ONLY)

## 2016-06-15 MED ORDER — METOCLOPRAMIDE HCL 5 MG/ML IJ SOLN
10.0000 mg | Freq: Four times a day (QID) | INTRAMUSCULAR | Status: DC | PRN
Start: 2016-06-15 — End: 2016-06-15
  Filled 2016-06-15: qty 2

## 2016-06-15 MED ORDER — METOCLOPRAMIDE HCL 5 MG/ML IJ SOLN
10.0000 mg | Freq: Four times a day (QID) | INTRAMUSCULAR | Status: DC | PRN
Start: 2016-06-15 — End: 2016-06-15
  Administered 2016-06-15: 10 mg via INTRAMUSCULAR
  Filled 2016-06-15 (×2): qty 2

## 2016-06-15 MED ORDER — DOCUSATE SODIUM 100 MG PO CAPS: 100.0000 mg | ORAL_CAPSULE | Freq: Every day | ORAL | 3 refills | Status: DC | PRN

## 2016-06-15 MED ORDER — IBUPROFEN 800 MG PO TABS: 800.0000 mg | ORAL_TABLET | Freq: Three times a day (TID) | ORAL | 0 refills | Status: DC | PRN

## 2016-06-15 MED ORDER — ASCORBIC ACID 250 MG PO TABS: 250.0000 mg | ORAL_TABLET | Freq: Two times a day (BID) | ORAL | 3 refills | Status: AC

## 2016-06-15 MED ORDER — FERROUS SULFATE 325 (65 FE) MG PO TABS: 325.0000 mg | ORAL_TABLET | Freq: Two times a day (BID) | ORAL | 3 refills | Status: DC

## 2016-06-15 MED ORDER — OXYCODONE-ACETAMINOPHEN 5-325 MG PO TABS: 1.0000 | ORAL_TABLET | Freq: Four times a day (QID) | ORAL | 0 refills | Status: DC | PRN

## 2016-06-15 NOTE — Progress Notes (Signed)
Patient discharge to home via wheelchair with spouse and baby in car seat.  

## 2016-06-15 NOTE — Discharge Summary (Signed)
Obstetric Discharge Summary Reason for Admission: onset of labor Intrapartum Procedures: cesarean: low cervical, transverse and elective BTL due to cord prolapse in labor under GETA Postpartum Procedures: none Complications-Operative and Postpartum: none - some pain, given reglan for bowel mobility Hemoglobin  Date Value Ref Range Status  06/14/2016 9.1 (L) 12.0 - 16.0 g/dL Final   HGB  Date Value Ref Range Status  04/02/2012 10.8 (L) 12.0 - 16.0 g/dL Final   HCT  Date Value Ref Range Status  06/14/2016 27.1 (L) 35.0 - 47.0 % Final  04/01/2012 34.5 (L) 35.0 - 47.0 % Final    Physical Exam:  General: alert, cooperative and appears stated age 61Lochia: appropriate Uterine Fundus: firm Incision: healing well, no significant drainage, no dehiscence, no significant erythema DVT Evaluation: No evidence of DVT seen on physical exam.  Discharge Diagnoses: Term Pregnancy-delivered  Discharge Information: Date: 06/15/2016 Activity: pelvic rest Diet: routine Medications: Ibuprofen, Colace, Iron and Percocet Condition: stable Instructions: refer to practice specific booklet Discharge to: home   Newborn Data: Live born female  Birth Weight: 5 lb 10.3 oz (2560 g) APGAR: 8, 9  Home with mother.  [name], [name] 06/15/2016, 8:48 AM

## 2016-06-17 ENCOUNTER — Emergency Department
Admission: EM | Admit: 2016-06-17 | Discharge: 2016-06-17 | Disposition: A | Discharge status: Home / Self Care | Payer: Medicaid Other | Attending: Emergency Medicine | Admitting: Emergency Medicine

## 2016-06-17 DIAGNOSIS — R109 Unspecified abdominal pain: Secondary | ICD-10-CM | POA: Diagnosis not present

## 2016-06-17 DIAGNOSIS — O9089 Other complications of the puerperium, not elsewhere classified: Secondary | ICD-10-CM | POA: Diagnosis present

## 2016-06-17 DIAGNOSIS — O99335 Smoking (tobacco) complicating the puerperium: Secondary | ICD-10-CM | POA: Diagnosis not present

## 2016-06-17 DIAGNOSIS — F17210 Nicotine dependence, cigarettes, uncomplicated: Secondary | ICD-10-CM | POA: Diagnosis not present

## 2016-06-17 DIAGNOSIS — G8918 Other acute postprocedural pain: Secondary | ICD-10-CM | POA: Diagnosis not present

## 2016-06-17 LAB — URINALYSIS COMPLETE WITH MICROSCOPIC (ARMC ONLY)
BACTERIA UA: NONE SEEN
GLUCOSE, UA: NEGATIVE mg/dL
HGB URINE DIPSTICK: NEGATIVE
LEUKOCYTES UA: NEGATIVE
Nitrite: NEGATIVE
Protein, ur: 30 mg/dL — AB
Specific Gravity, Urine: 1.035 — ABNORMAL HIGH (ref 1.005–1.030)
pH: 5 (ref 5.0–8.0)

## 2016-06-17 LAB — COMPREHENSIVE METABOLIC PANEL
ALT: 17 U/L (ref 14–54)
AST: 18 U/L (ref 15–41)
Albumin: 3.2 g/dL — ABNORMAL LOW (ref 3.5–5.0)
Alkaline Phosphatase: 74 U/L (ref 38–126)
Anion gap: 10 (ref 5–15)
BILIRUBIN TOTAL: 1 mg/dL (ref 0.3–1.2)
BUN: 9 mg/dL (ref 6–20)
CO2: 24 mmol/L (ref 22–32)
CREATININE: 0.31 mg/dL — AB (ref 0.44–1.00)
Calcium: 9.2 mg/dL (ref 8.9–10.3)
Chloride: 101 mmol/L (ref 101–111)
GFR calc Af Amer: >60 mL/min (ref >60)
GLUCOSE: 113 mg/dL — AB (ref 65–99)
Potassium: 3.5 mmol/L (ref 3.5–5.1)
Sodium: 135 mmol/L (ref 135–145)
TOTAL PROTEIN: 7.5 g/dL (ref 6.5–8.1)

## 2016-06-17 LAB — CBC WITH DIFFERENTIAL/PLATELET
BASOS ABS: 0 10*3/uL (ref 0–0.1)
Basophils Relative: 1 %
EOS ABS: 0 10*3/uL (ref 0–0.7)
EOS PCT: 0 %
HCT: 32.5 % — ABNORMAL LOW (ref 35.0–47.0)
Hemoglobin: 11.2 g/dL — ABNORMAL LOW (ref 12.0–16.0)
LYMPHS ABS: 0.9 10*3/uL — AB (ref 1.0–3.6)
Lymphocytes Relative: 13 %
MCH: 26.4 pg (ref 26.0–34.0)
MCHC: 34.3 g/dL (ref 32.0–36.0)
MCV: 77 fL — ABNORMAL LOW (ref 80.0–100.0)
MONO ABS: 0.7 10*3/uL (ref 0.2–0.9)
Monocytes Relative: 10 %
Neutro Abs: 5.5 10*3/uL (ref 1.4–6.5)
Neutrophils Relative %: 76 %
PLATELETS: 431 10*3/uL (ref 150–440)
RBC: 4.22 MIL/uL (ref 3.80–5.20)
RDW: 14.8 % — AB (ref 11.5–14.5)
WBC: 7.3 10*3/uL (ref 3.6–11.0)

## 2016-06-17 LAB — LIPASE, BLOOD: LIPASE: 18 U/L (ref 11–51)

## 2016-06-17 MED ORDER — SODIUM CHLORIDE 0.9 % IV BOLUS (SEPSIS)
1000.0000 mL | Freq: Once | INTRAVENOUS | Status: AC
  Administered 2016-06-17: 1000 mL via INTRAVENOUS

## 2016-06-17 MED ORDER — ONDANSETRON HCL 4 MG PO TABS: 4.0000 mg | ORAL_TABLET | Freq: Three times a day (TID) | ORAL | 0 refills | Status: DC | PRN

## 2016-06-17 NOTE — ED Provider Notes (Addendum)
[facility] Emergency Department Provider Note  ____________________________________________   I have reviewed the triage vital signs and the nursing notes.   HISTORY  Chief Complaint Abdominal Pain and Emesis    HPI [name] is a 30 y.o. 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 Patient has been doing well at home. She has passed minimal lochia, she is not having fever. She took her pain medication on an empty stomach, vomited a few times, and noted also that after taking iron pills she had slightly dark stool and came in. Patient is continuing to have discomfort in her pannus especially when she walks. However, there is no increased pain to that area since the procedure and fact it is getting better. No fever no chills. No dysuria no cough no shortness of breath no leg swelling.      Past Medical History:  Diagnosis Date  . Anxiety   . Depression     Patient Active Problem List   Diagnosis Date Noted  . Post-operative state 06/13/2016  . Abdominal pain affecting pregnancy, antepartum 02/28/2016    Past Surgical History:  Procedure Laterality Date  . CESAREAN SECTION N/A 06/12/2016   Procedure: STAT CESAREAN SECTION WITH BILATERAL STERILIZATION;  Surgeon: Suzy Bouchard, MD;  Location: [facility] ORS;  Service: Obstetrics;  Laterality: N/A;  Baby Girl @ 2246 Weight 5 lb 10 oz Apgars 8/9    Prior to Admission medications   Medication Sig Start Date End Date Taking? Authorizing Provider  ascorbic acid (VITAMIN C) 250 MG tablet Take 1 tablet (250 mg total) by mouth 2 (two) times daily with a meal. Take with iron for anemia 06/15/16 08/14/16  Christeen Douglas, MD  docusate sodium (COLACE) 100 MG capsule Take 1 capsule (100 mg total) by mouth daily as needed for mild constipation. 06/15/16   Christeen Douglas, MD  ferrous  sulfate 325 (65 FE) MG tablet Take 1 tablet (325 mg total) by mouth 2 (two) times daily with a meal. For anemia, take with Vitamin C 06/15/16 08/14/16  Christeen Douglas, MD  fluconazole (DIFLUCAN) 150 MG tablet Take 1 tablet (150 mg total) by mouth daily. Take 1 tablet in 1 week if still having yeast symptoms Patient not taking: Reported on 06/12/2016 02/28/16   Karena Addison, CNM  ibuprofen (ADVIL,MOTRIN) 800 MG tablet Take 1 tablet (800 mg total) by mouth every 8 (eight) hours as needed for moderate pain or cramping. 06/15/16 06/22/16  Christeen Douglas, MD  metroNIDAZOLE (METROGEL) 0.75 % vaginal gel Place 1 Applicatorful vaginally at bedtime. For bacterial vaginosis Patient not taking: Reported on 06/12/2016 02/28/16   Karena Addison, CNM  oxyCODONE-acetaminophen (PERCOCET) 5-325 MG tablet Take 1-2 tablets by mouth every 6 (six) hours as needed for severe pain. 06/15/16   Christeen Douglas, MD    Allergies Review of patient's allergies indicates no known allergies.  No family history on file.  Social History Social History  Substance Use Topics  . Smoking status: Current Every Day Smoker    Packs/day: 0.50    Types: Cigarettes  . Smokeless tobacco: Current User  . Alcohol use 4.2 oz/week    7 Cans of beer per week     Comment: 40oz/day    Review of Systems Constitutional: No fever/chills Eyes: No visual changes. ENT: No sore throat. No stiff neck no  neck pain Cardiovascular: Denies chest pain. Respiratory: Denies shortness of breath. Gastrointestinal:   no vomiting.  No diarrhea.  No constipation. Genitourinary: Negative for dysuria. Musculoskeletal: Negative lower extremity swelling Skin: Negative for rash. Neurological: Negative for severe headaches, focal weakness or numbness. 10-point ROS otherwise negative.  ____________________________________________   PHYSICAL EXAM:  VITAL SIGNS: ED Triage Vitals  Enc Vitals Group     BP 06/17/16 1225 111/72     Pulse Rate 06/17/16  1225 97     Resp 06/17/16 1225 20     Temp 06/17/16 1225 98.2 F (36.8 C)     Temp Source 06/17/16 1225 Oral     SpO2 06/17/16 1225 98 %     Weight 06/17/16 1225 250 lb (113.4 kg)     Height 06/17/16 1225 5\' 4"  (1.626 m)     Head Circumference --      Peak Flow --      Pain Score 06/17/16 1228 5     Pain Loc --      Pain Edu? --      Excl. in GC? --     Constitutional: Alert and oriented. Well appearing and in no acute distress. Eyes: Conjunctivae are normal. PERRL. EOMI. Head: Atraumatic. Nose: No congestion/rhinnorhea. Mouth/Throat: Mucous membranes are moist.  Oropharynx non-erythematous. Neck: No stridor.   Nontender with no meningismus Cardiovascular: Normal rate, regular rhythm. Grossly normal heart sounds.  Good peripheral circulation. Respiratory: Normal respiratory effort.  No retractions. Lungs CTAB. Abdominal: Soft and nontender. No distention. No guarding no rebound, incision is clean dry and intact with no evidence of cellulitic or induration or fluctuant changes Back:  There is no focal tenderness or step off.  there is no midline tenderness there are no lesions noted. there is no CVA tenderness Musculoskeletal: No lower extremity tenderness, no upper extremity tenderness. No joint effusions, no DVT signs strong distal pulses no edema Neurologic:  Normal speech and language. No gross focal neurologic deficits are appreciated.  Skin:  Skin is warm, dry and intact. No rash noted. Psychiatric: Mood and affect are normal. Speech and behavior are normal.  ____________________________________________   LABS (all labs ordered are listed, but only abnormal results are displayed)  Labs Reviewed  COMPREHENSIVE METABOLIC PANEL - Abnormal; Notable for the following:       Result Value   Glucose, Bld 113 (*)    Creatinine, Ser 0.31 (*)    Albumin 3.2 (*)    All other components within normal limits  CBC WITH DIFFERENTIAL/PLATELET - Abnormal; Notable for the following:     Hemoglobin 11.2 (*)    HCT 32.5 (*)    MCV 77.0 (*)    RDW 14.8 (*)    Lymphs Abs 0.9 (*)    All other components within normal limits  URINALYSIS COMPLETEWITH MICROSCOPIC ([facility] ONLY) - Abnormal; Notable for the following:    Color, Urine AMBER (*)    APPearance CLEAR (*)    Bilirubin Urine 1+ (*)    Ketones, ur 2+ (*)    Specific Gravity, Urine 1.035 (*)    Protein, ur 30 (*)    Squamous Epithelial / LPF 0-5 (*)    All other components within normal limits  LIPASE, BLOOD   ____________________________________________  EKG  I personally interpreted any EKGs ordered by me or triage  ____________________________________________  RADIOLOGY  I reviewed any imaging ordered by me or triage that were performed during my shift and, if possible, patient and/or family made aware of any  abnormal findings. ____________________________________________   PROCEDURES  Procedure(s) performed: None  Procedures  Critical Care performed: None  ____________________________________________   INITIAL IMPRESSION / ASSESSMENT AND PLAN / ED COURSE  Pertinent labs & imaging results that were available during my care of the patient were reviewed by me and considered in my medical decision making (see chart for details).  Markedly well-appearing woman who is 5 days post procedure with no evidence of ongoing infection. She is eating and drinking her abdomen is completely benign. I did discuss with the on-call OB/GYN, for her group, Dr. Elesa MassedWard, and they feel that the patient is adequately worked up. They do not feel the need to come see her at this time I agree. Patient is in no acute distress. No evidence of GI bleed. Declines rectal exam. No evidence of UTI no evidence of wound infection no evidence of a true abdominal pathology no evidence of cellulitis or abscess no evidence of cholecystitis, no evidence of help syndrome, no evidence of preeclampsia, no evidence of PE or dissection in short, very  reassuring workup. We will discharge her home with close outpatient follow-up with OB and return precautions were given given and understood.  Clinical Course   ____________________________________________   FINAL CLINICAL IMPRESSION(S) / ED DIAGNOSES  Final diagnoses:  None      This chart was dictated using voice recognition software.  Despite best efforts to proofread,  errors can occur which can change meaning.      Jeanmarie PlantJames A Gurkaran Rahm, MD 06/17/16 [id number]    Jeanmarie PlantJames A Jaslin Novitski, MD 06/17/16 [phone]

## 2016-06-17 NOTE — Discharge Instructions (Signed)
If you have increased pain, vomiting, fever, bleeding, lightheadedness, or you feel worse in any way, return to the emergency room. Follow closely with your OB/GYN.

## 2016-06-17 NOTE — ED Notes (Signed)
Pt states awoke this am with c-section pain, took per pain pill on an empty stomach and 10-15 min later vomited it up. Vomited 4x total. C/o abd cramping. Also c/o hurts in the c-section area when she walks and her skin getting irritated from panis rubbing her thighs. Placed a pillow case under the panis for comfort.

## 2016-06-17 NOTE — ED Triage Notes (Signed)
Pt reports having a section Monday and was discharge Thursday. C/o black stools and vomiting that started this morning. States she takes iron supplements and has seen her stool that color before. C/o generalized  abd pain and back pain. Reports vomiting 4 times today.

## 2016-06-19 ENCOUNTER — Emergency Department: Payer: Medicaid Other

## 2016-06-19 ENCOUNTER — Inpatient Hospital Stay: Payer: Medicaid Other | Admitting: Anesthesiology

## 2016-06-19 ENCOUNTER — Encounter
Admission: EM | Disposition: A | Discharge status: Home / Self Care | Payer: Self-pay | Source: Home / Self Care | Attending: Surgery

## 2016-06-19 ENCOUNTER — Inpatient Hospital Stay
Admission: EM | Admit: 2016-06-19 | Discharge: 2016-06-24 | DRG: 354 | Disposition: A | Discharge status: Home / Self Care | Payer: Medicaid Other | Attending: Surgery | Admitting: Surgery

## 2016-06-19 DIAGNOSIS — E669 Obesity, unspecified: Secondary | ICD-10-CM | POA: Diagnosis present

## 2016-06-19 DIAGNOSIS — K430 Incisional hernia with obstruction, without gangrene: Secondary | ICD-10-CM | POA: Diagnosis present

## 2016-06-19 DIAGNOSIS — R1110 Vomiting, unspecified: Secondary | ICD-10-CM

## 2016-06-19 DIAGNOSIS — K436 Other and unspecified ventral hernia with obstruction, without gangrene: Principal | ICD-10-CM | POA: Diagnosis present

## 2016-06-19 DIAGNOSIS — K660 Peritoneal adhesions (postprocedural) (postinfection): Secondary | ICD-10-CM | POA: Diagnosis present

## 2016-06-19 DIAGNOSIS — Z6841 Body Mass Index (BMI) 40.0 and over, adult: Secondary | ICD-10-CM

## 2016-06-19 DIAGNOSIS — K439 Ventral hernia without obstruction or gangrene: Secondary | ICD-10-CM

## 2016-06-19 DIAGNOSIS — K56609 Unspecified intestinal obstruction, unspecified as to partial versus complete obstruction: Secondary | ICD-10-CM

## 2016-06-19 DIAGNOSIS — T8130XA Disruption of wound, unspecified, initial encounter: Secondary | ICD-10-CM | POA: Diagnosis present

## 2016-06-19 DIAGNOSIS — K5669 Other intestinal obstruction: Secondary | ICD-10-CM | POA: Diagnosis not present

## 2016-06-19 HISTORY — PX: VENTRAL HERNIA REPAIR: SHX424

## 2016-06-19 LAB — COMPREHENSIVE METABOLIC PANEL
ALT: 15 U/L (ref 14–54)
AST: 17 U/L (ref 15–41)
Albumin: 3.2 g/dL — ABNORMAL LOW (ref 3.5–5.0)
Alkaline Phosphatase: 64 U/L (ref 38–126)
Anion gap: 12 (ref 5–15)
BILIRUBIN TOTAL: 0.6 mg/dL (ref 0.3–1.2)
BUN: 10 mg/dL (ref 6–20)
CO2: 24 mmol/L (ref 22–32)
Calcium: 9.3 mg/dL (ref 8.9–10.3)
Chloride: 97 mmol/L — ABNORMAL LOW (ref 101–111)
Creatinine, Ser: 0.53 mg/dL (ref 0.44–1.00)
GFR calc Af Amer: >60 mL/min (ref >60)
GFR calc non Af Amer: >60 mL/min (ref >60)
GLUCOSE: 138 mg/dL — AB (ref 65–99)
POTASSIUM: 3.1 mmol/L — AB (ref 3.5–5.1)
Sodium: 133 mmol/L — ABNORMAL LOW (ref 135–145)
Total Protein: 7.8 g/dL (ref 6.5–8.1)

## 2016-06-19 LAB — CBC
HEMATOCRIT: 33.6 % — AB (ref 35.0–47.0)
Hemoglobin: 11.9 g/dL — ABNORMAL LOW (ref 12.0–16.0)
MCH: 26.9 pg (ref 26.0–34.0)
MCHC: 35.4 g/dL (ref 32.0–36.0)
MCV: 75.9 fL — AB (ref 80.0–100.0)
Platelets: 531 10*3/uL — ABNORMAL HIGH (ref 150–440)
RBC: 4.42 MIL/uL (ref 3.80–5.20)
RDW: 14.7 % — AB (ref 11.5–14.5)
WBC: 7.7 10*3/uL (ref 3.6–11.0)

## 2016-06-19 LAB — LIPASE, BLOOD: LIPASE: 14 U/L (ref 11–51)

## 2016-06-19 LAB — URINALYSIS COMPLETE WITH MICROSCOPIC (ARMC ONLY)
BACTERIA UA: NONE SEEN
Glucose, UA: NEGATIVE mg/dL
Nitrite: NEGATIVE
PH: 5 (ref 5.0–8.0)
PROTEIN: 100 mg/dL — AB
SPECIFIC GRAVITY, URINE: 1.036 — AB (ref 1.005–1.030)
Trans Epithel, UA: 1

## 2016-06-19 LAB — MRSA PCR SCREENING: MRSA BY PCR: NEGATIVE

## 2016-06-19 SURGERY — REPAIR, HERNIA, VENTRAL
Anesthesia: General | Site: Abdomen | Wound class: Clean

## 2016-06-19 MED ORDER — CEFAZOLIN SODIUM-DEXTROSE 2-4 GM/100ML-% IV SOLN
2.0000 g | INTRAVENOUS | Status: AC
  Administered 2016-06-19: 2 g via INTRAVENOUS
  Filled 2016-06-19: qty 100

## 2016-06-19 MED ORDER — ONDANSETRON HCL 4 MG/2ML IJ SOLN
4.0000 mg | Freq: Four times a day (QID) | INTRAMUSCULAR | Status: DC | PRN
Start: 2016-06-19 — End: 2016-06-23
  Administered 2016-06-21: 4 mg via INTRAVENOUS
  Filled 2016-06-19 (×2): qty 2

## 2016-06-19 MED ORDER — MIDAZOLAM HCL 2 MG/2ML IJ SOLN
INTRAMUSCULAR | Status: DC | PRN
  Administered 2016-06-19: 2 mg via INTRAVENOUS

## 2016-06-19 MED ORDER — HYDROMORPHONE HCL 1 MG/ML IJ SOLN
1.0000 mg | INTRAMUSCULAR | Status: DC | PRN
  Administered 2016-06-19: 1 mg via INTRAVENOUS
  Administered 2016-06-19: 0.5 mg via INTRAVENOUS
  Administered 2016-06-19: .3 mg via INTRAVENOUS
  Administered 2016-06-19: .2 mg via INTRAVENOUS
  Administered 2016-06-23: 1 mg via INTRAVENOUS
  Filled 2016-06-19 (×2): qty 1

## 2016-06-19 MED ORDER — METRONIDAZOLE 0.75 % VA GEL: 1.0000 | Freq: Every day | VAGINAL | Status: DC

## 2016-06-19 MED ORDER — POTASSIUM CHLORIDE 10 MEQ/100ML IV SOLN
10.0000 meq | INTRAVENOUS | Status: DC
  Administered 2016-06-19: 10 meq via INTRAVENOUS
  Filled 2016-06-19 (×2): qty 100

## 2016-06-19 MED ORDER — HEPARIN SODIUM (PORCINE) 5000 UNIT/ML IJ SOLN
5000.0000 [IU] | Freq: Three times a day (TID) | INTRAMUSCULAR | Status: DC
  Administered 2016-06-19 – 2016-06-24 (×14): 5000 [IU] via SUBCUTANEOUS
  Filled 2016-06-19 (×14): qty 1

## 2016-06-19 MED ORDER — METOCLOPRAMIDE HCL 5 MG/ML IJ SOLN
10.0000 mg | Freq: Once | INTRAMUSCULAR | Status: AC
  Administered 2016-06-19: 10 mg via INTRAVENOUS
  Filled 2016-06-19: qty 2

## 2016-06-19 MED ORDER — OXYCODONE HCL 5 MG/5ML PO SOLN: 5.0000 mg | Freq: Once | ORAL | Status: DC | PRN

## 2016-06-19 MED ORDER — DEXAMETHASONE SODIUM PHOSPHATE 10 MG/ML IJ SOLN
INTRAMUSCULAR | Status: DC | PRN
  Administered 2016-06-19: 10 mg via INTRAVENOUS

## 2016-06-19 MED ORDER — POTASSIUM CHLORIDE 10 MEQ/100ML IV SOLN
10.0000 meq | Freq: Once | INTRAVENOUS | Status: DC
  Filled 2016-06-19: qty 100

## 2016-06-19 MED ORDER — SODIUM CHLORIDE 0.9 % IV BOLUS (SEPSIS)
1000.0000 mL | Freq: Once | INTRAVENOUS | Status: AC
  Administered 2016-06-19: 1000 mL via INTRAVENOUS

## 2016-06-19 MED ORDER — PROPOFOL 10 MG/ML IV BOLUS
INTRAVENOUS | Status: DC | PRN
  Administered 2016-06-19: 150 mg via INTRAVENOUS

## 2016-06-19 MED ORDER — PROMETHAZINE HCL 25 MG/ML IJ SOLN
12.5000 mg | Freq: Once | INTRAMUSCULAR | Status: AC
  Administered 2016-06-19: 12.5 mg via INTRAVENOUS
  Filled 2016-06-19: qty 1

## 2016-06-19 MED ORDER — ACETAMINOPHEN 10 MG/ML IV SOLN
INTRAVENOUS | Status: DC | PRN
  Administered 2016-06-19: 1000 mg via INTRAVENOUS

## 2016-06-19 MED ORDER — IOPAMIDOL (ISOVUE-300) INJECTION 61%
15.0000 mL | INTRAVENOUS | Status: DC
  Administered 2016-06-19: 15 mL via ORAL

## 2016-06-19 MED ORDER — IOPAMIDOL (ISOVUE-370) INJECTION 76%
100.0000 mL | Freq: Once | INTRAVENOUS | Status: AC | PRN
  Administered 2016-06-19: 100 mL via INTRAVENOUS

## 2016-06-19 MED ORDER — ONDANSETRON HCL 4 MG/2ML IJ SOLN
INTRAMUSCULAR | Status: DC | PRN
  Administered 2016-06-19: 4 mg via INTRAVENOUS

## 2016-06-19 MED ORDER — DIPHENHYDRAMINE HCL 12.5 MG/5ML PO ELIX: 12.5000 mg | ORAL_SOLUTION | Freq: Four times a day (QID) | ORAL | Status: DC | PRN

## 2016-06-19 MED ORDER — HYDROMORPHONE HCL 1 MG/ML IJ SOLN
INTRAMUSCULAR | Status: AC
  Filled 2016-06-19: qty 1

## 2016-06-19 MED ORDER — SUCCINYLCHOLINE CHLORIDE 20 MG/ML IJ SOLN
INTRAMUSCULAR | Status: DC | PRN
  Administered 2016-06-19: 100 mg via INTRAVENOUS

## 2016-06-19 MED ORDER — FENTANYL CITRATE (PF) 100 MCG/2ML IJ SOLN
INTRAMUSCULAR | Status: DC | PRN
  Administered 2016-06-19: 50 ug via INTRAVENOUS
  Administered 2016-06-19: 100 ug via INTRAVENOUS

## 2016-06-19 MED ORDER — HYDROMORPHONE 1 MG/ML IV SOLN
INTRAVENOUS | Status: DC
  Administered 2016-06-19: 0.3 mg via INTRAVENOUS
  Administered 2016-06-20: 2.4 mg via INTRAVENOUS
  Administered 2016-06-20: 0.3 mg via INTRAVENOUS
  Administered 2016-06-20: 3.3 mg via INTRAVENOUS
  Administered 2016-06-20: 2.3 mg via INTRAVENOUS
  Administered 2016-06-20: 1.5 mg via INTRAVENOUS
  Administered 2016-06-20: 0.5 mg via INTRAVENOUS
  Administered 2016-06-21: 2.1 mg via INTRAVENOUS
  Administered 2016-06-21: 2.7 mg via INTRAVENOUS
  Administered 2016-06-21: 2.4 mg via INTRAVENOUS
  Administered 2016-06-21: 1.8 mg via INTRAVENOUS
  Administered 2016-06-21: 2.1 mg via INTRAVENOUS
  Administered 2016-06-21: 2.4 mg via INTRAVENOUS
  Administered 2016-06-22: 0.3 mg via INTRAVENOUS
  Administered 2016-06-22: 2.7 mg via INTRAVENOUS
  Administered 2016-06-22: 1.8 mg via INTRAVENOUS
  Administered 2016-06-22: 0.9 mg via INTRAVENOUS
  Administered 2016-06-22: 1.2 mg via INTRAVENOUS
  Administered 2016-06-22: 1.8 mg via INTRAVENOUS
  Administered 2016-06-23: 2.4 mg via INTRAVENOUS
  Administered 2016-06-23: 0.9 mg via INTRAVENOUS
  Administered 2016-06-23: 0.6 mg via INTRAVENOUS
  Administered 2016-06-23: 1.2 mg via INTRAVENOUS
  Filled 2016-06-19 (×2): qty 25

## 2016-06-19 MED ORDER — SODIUM CHLORIDE 0.9% FLUSH: 9.0000 mL | INTRAVENOUS | Status: DC | PRN

## 2016-06-19 MED ORDER — OXYCODONE HCL 5 MG PO TABS: 5.0000 mg | ORAL_TABLET | Freq: Once | ORAL | Status: DC | PRN

## 2016-06-19 MED ORDER — NALOXONE HCL 0.4 MG/ML IJ SOLN: 0.4000 mg | INTRAMUSCULAR | Status: DC | PRN

## 2016-06-19 MED ORDER — BUPIVACAINE-EPINEPHRINE (PF) 0.25% -1:200000 IJ SOLN
INTRAMUSCULAR | Status: DC | PRN
  Administered 2016-06-19: 30 mL

## 2016-06-19 MED ORDER — SUGAMMADEX SODIUM 500 MG/5ML IV SOLN
INTRAVENOUS | Status: DC | PRN
  Administered 2016-06-19: 215 mg via INTRAVENOUS

## 2016-06-19 MED ORDER — ROCURONIUM BROMIDE 100 MG/10ML IV SOLN
INTRAVENOUS | Status: DC | PRN
  Administered 2016-06-19: 35 mg via INTRAVENOUS
  Administered 2016-06-19: 5 mg via INTRAVENOUS

## 2016-06-19 MED ORDER — MORPHINE SULFATE (PF) 4 MG/ML IV SOLN
4.0000 mg | INTRAVENOUS | Status: DC | PRN
  Administered 2016-06-19 (×2): 4 mg via INTRAVENOUS
  Filled 2016-06-19 (×2): qty 1

## 2016-06-19 MED ORDER — PROMETHAZINE HCL 25 MG/ML IJ SOLN
6.2500 mg | INTRAMUSCULAR | Status: DC | PRN
  Administered 2016-06-23: 12.5 mg via INTRAVENOUS
  Filled 2016-06-19: qty 1

## 2016-06-19 MED ORDER — FENTANYL CITRATE (PF) 100 MCG/2ML IJ SOLN: 25.0000 ug | INTRAMUSCULAR | Status: DC | PRN

## 2016-06-19 MED ORDER — LACTATED RINGERS IV SOLN
INTRAVENOUS | Status: DC
  Administered 2016-06-19 (×2): via INTRAVENOUS

## 2016-06-19 MED ORDER — ACETAMINOPHEN 10 MG/ML IV SOLN
INTRAVENOUS | Status: AC
Start: 2016-06-19 — End: 2016-06-19
  Filled 2016-06-19: qty 100

## 2016-06-19 MED ORDER — MEPERIDINE HCL 25 MG/ML IJ SOLN: 6.2500 mg | INTRAMUSCULAR | Status: DC | PRN

## 2016-06-19 MED ORDER — DIPHENHYDRAMINE HCL 50 MG/ML IJ SOLN: 12.5000 mg | Freq: Four times a day (QID) | INTRAMUSCULAR | Status: DC | PRN

## 2016-06-19 SURGICAL SUPPLY — 31 items
ADHESIVE MASTISOL STRL (MISCELLANEOUS) ×2 IMPLANT
CANISTER SUCT 1200ML W/VALVE (MISCELLANEOUS) ×2 IMPLANT
CHLORAPREP W/TINT 26ML (MISCELLANEOUS) ×2 IMPLANT
DRAIN PENROSE 1/4X12 LTX (DRAIN) ×2 IMPLANT
DRAPE LAPAROTOMY 100X77 ABD (DRAPES) ×2 IMPLANT
ELECT REM PT RETURN 9FT ADLT (ELECTROSURGICAL) ×2
ELECTRODE REM PT RTRN 9FT ADLT (ELECTROSURGICAL) ×1 IMPLANT
ETHIBOND 2 0 GREEN CT 2 30IN (SUTURE) ×4 IMPLANT
GAUZE SPONGE 4X4 12PLY STRL (GAUZE/BANDAGES/DRESSINGS) ×2 IMPLANT
GLOVE BIO SURGEON STRL SZ8 (GLOVE) ×4 IMPLANT
GOWN STRL REUS W/ TWL LRG LVL3 (GOWN DISPOSABLE) ×2 IMPLANT
GOWN STRL REUS W/TWL LRG LVL3 (GOWN DISPOSABLE) ×2
JACKSON PRATT 10 (INSTRUMENTS) ×2 IMPLANT
KIT RM TURNOVER STRD PROC AR (KITS) ×2 IMPLANT
LABEL OR SOLS (LABEL) ×2 IMPLANT
NDL SAFETY 22GX1.5 (NEEDLE) ×2 IMPLANT
NS IRRIG 500ML POUR BTL (IV SOLUTION) ×2 IMPLANT
PACK BASIN MINOR ARMC (MISCELLANEOUS) ×2 IMPLANT
SPONGE LAP 18X18 5 PK (GAUZE/BANDAGES/DRESSINGS) ×2 IMPLANT
STAPLER SKIN PROX 35W (STAPLE) ×2 IMPLANT
STRIP CLOSURE SKIN 1/2X4 (GAUZE/BANDAGES/DRESSINGS) ×2 IMPLANT
SUT MNCRL 4-0 (SUTURE) ×1
SUT MNCRL 4-0 27XMFL (SUTURE) ×1
SUT PROLENE 0 CT 1 30 (SUTURE) ×6 IMPLANT
SUT PROLENE 0 CTX CR/8 (SUTURE) ×4 IMPLANT
SUT SILK 3-0 (SUTURE) ×6 IMPLANT
SUT VIC AB 0 CT2 27 (SUTURE) ×4 IMPLANT
SUT VIC AB 3-0 SH 27 (SUTURE) ×1
SUT VIC AB 3-0 SH 27X BRD (SUTURE) ×1 IMPLANT
SUTURE MNCRL 4-0 27XMF (SUTURE) ×1 IMPLANT
SYRINGE 10CC LL (SYRINGE) ×2 IMPLANT

## 2016-06-19 NOTE — ED Triage Notes (Signed)
Reports went home Thursday after having a C-section.  Reports she has had abdominal pain, back pain , unable to get comfortable.  Reports started vomiting since Saturday and that it is now dark.

## 2016-06-19 NOTE — H&P (Signed)
[name] is an 30 y.o. female.    Chief Complaint: Abdominal Pain  HPI: This a patient with abdominal pain pointing to her lower quadrants mostly in the right side this been present since her C-section a week ago. She states that she's had some nausea and some emesis and has not had a bowel movement and is not passing any gas. States her pain is been present since her C-section and states that she should not of been sent home last week. However she does not state that it was any worse now than it was  Asked see the patient for CT findings of a bowel obstruction and a ventral hernia and a patient one week post op from C-section.  Past Medical History:  Diagnosis Date  . Anxiety   . Depression     Past Surgical History:  Procedure Laterality Date  . CESAREAN SECTION N/A 06/12/2016   Procedure: STAT CESAREAN SECTION WITH BILATERAL STERILIZATION;  Surgeon: Boykin Nearing, MD;  Location: [facility] ORS;  Service: Obstetrics;  Laterality: N/A;  Baby Girl @ 2246 Weight 5 lb 10 oz Apgars 8/9    No family history on file. Social History:  reports that she has been smoking Cigarettes.  She has been smoking about 0.50 packs per day. She uses smokeless tobacco. She reports that she drinks about 4.2 oz of alcohol per week . She reports that she does not use drugs.  Allergies: No Known Allergies   (Not in a hospital admission)   Review of Systems  Constitutional: Negative.   HENT: Negative.   Eyes: Negative.   Respiratory: Negative.   Cardiovascular: Negative.   Gastrointestinal: Positive for abdominal pain, nausea and vomiting. Negative for blood in stool, constipation, diarrhea, heartburn and melena.  Genitourinary: Negative.   Musculoskeletal: Negative.   Skin: Negative.   Neurological: Negative.   Endo/Heme/Allergies: Negative.   Psychiatric/Behavioral: Negative.      Physical Exam:  BP (!) 155/90 (BP Location: Right Arm)   Pulse 98   Temp 97.9 F (36.6 C) (Oral)    Resp 16   SpO2 100%   Physical Exam  Constitutional: She is oriented to person, place, and time. No distress.  Morbidly obese tearful prefers to sit up  HENT:  Head: Normocephalic and atraumatic.  Eyes: Right eye exhibits no discharge. Left eye exhibits no discharge. No scleral icterus.  Neck: Normal range of motion.  Cardiovascular: Normal rate, regular rhythm and normal heart sounds.   Pulmonary/Chest: Effort normal and breath sounds normal. No respiratory distress. She has no wheezes. She has no rales.  Abdominal: Soft. She exhibits no distension. There is tenderness. There is no rebound and no guarding.  Overly obese abdomen minimally tender in the lower quadrants no visible or palpable hernia and certainly not reducible. Pfannenstiel scar is well-healed erythema or drainage  Musculoskeletal: Normal range of motion. She exhibits no edema or tenderness.  Lymphadenopathy:    She has no cervical adenopathy.  Neurological: She is alert and oriented to person, place, and time.  Skin: Skin is warm and dry. No rash noted. She is not diaphoretic. No erythema.  Psychiatric: Mood and affect normal.  Vitals reviewed.       Results for orders placed or performed during the hospital encounter of 06/19/16 (from the past 48 hour(s))  Lipase, blood     Status: None   Collection Time: 06/19/16  3:08 AM  Result Value Ref Range   Lipase 14 11 - 51 U/L  Comprehensive  metabolic panel     Status: Abnormal   Collection Time: 06/19/16  3:08 AM  Result Value Ref Range   Sodium 133 (L) 135 - 145 mmol/L   Potassium 3.1 (L) 3.5 - 5.1 mmol/L   Chloride 97 (L) 101 - 111 mmol/L   CO2 24 22 - 32 mmol/L   Glucose, Bld 138 (H) 65 - 99 mg/dL   BUN 10 6 - 20 mg/dL   Creatinine, Ser 0.53 0.44 - 1.00 mg/dL   Calcium 9.3 8.9 - 10.3 mg/dL   Total Protein 7.8 6.5 - 8.1 g/dL   Albumin 3.2 (L) 3.5 - 5.0 g/dL   AST 17 15 - 41 U/L   ALT 15 14 - 54 U/L   Alkaline Phosphatase 64 38 - 126 U/L   Total Bilirubin  0.6 0.3 - 1.2 mg/dL   GFR calc non Af Amer >60 >60 mL/min   GFR calc Af Amer >60 >60 mL/min    Comment: (NOTE) The eGFR has been calculated using the CKD EPI equation. This calculation has not been validated in all clinical situations. eGFR's persistently <60 mL/min signify possible Chronic Kidney Disease.    Anion gap 12 5 - 15  CBC     Status: Abnormal   Collection Time: 06/19/16  3:08 AM  Result Value Ref Range   WBC 7.7 3.6 - 11.0 K/uL   RBC 4.42 3.80 - 5.20 MIL/uL   Hemoglobin 11.9 (L) 12.0 - 16.0 g/dL   HCT 33.6 (L) 35.0 - 47.0 %   MCV 75.9 (L) 80.0 - 100.0 fL   MCH 26.9 26.0 - 34.0 pg   MCHC 35.4 32.0 - 36.0 g/dL   RDW 14.7 (H) 11.5 - 14.5 %   Platelets 531 (H) 150 - 440 K/uL  Urinalysis complete, with microscopic ([facility] only)     Status: Abnormal   Collection Time: 06/19/16 10:00 AM  Result Value Ref Range   Color, Urine AMBER (A) YELLOW   APPearance HAZY (A) CLEAR   Glucose, UA NEGATIVE NEGATIVE mg/dL   Bilirubin Urine 1+ (A) NEGATIVE   Ketones, ur 1+ (A) NEGATIVE mg/dL   Specific Gravity, Urine 1.036 (H) 1.005 - 1.030   Hgb urine dipstick 3+ (A) NEGATIVE   pH 5.0 5.0 - 8.0   Protein, ur 100 (A) NEGATIVE mg/dL   Nitrite NEGATIVE NEGATIVE   Leukocytes, UA 2+ (A) NEGATIVE   RBC / HPF TOO NUMEROUS TO COUNT 0 - 5 RBC/hpf   WBC, UA TOO NUMEROUS TO COUNT 0 - 5 WBC/hpf   Bacteria, UA NONE SEEN NONE SEEN   Squamous Epithelial / LPF 6-30 (A) NONE SEEN   Trans Epithel, UA 1    Mucous PRESENT    Ct Abdomen Pelvis W Contrast  Result Date: 06/19/2016 CLINICAL DATA:  C-section 4 days ago. Abdominal and back pain. Vomiting 2 days. EXAM: CT ABDOMEN AND PELVIS WITH CONTRAST TECHNIQUE: Multidetector CT imaging of the abdomen and pelvis was performed using the standard protocol following bolus administration of intravenous contrast. CONTRAST:  100 mL Isovue 370 IV COMPARISON:  None. FINDINGS: Lower chest: Within normal. Hepatobiliary: Mild cholelithiasis. Liver and biliary tree are  within normal. Pancreas: Within normal. Spleen: Within normal. Adrenals/Urinary Tract: Adrenal glands are normal and symmetric. Kidneys an ureters are normal. Stomach/Bowel: Mild gastric distention. There are multiple dilated air and fluid-filled small bowel loops measuring up to 5.4 cm in diameter. The dilated loops extend to a ventral hernia over the right lower quadrant which contains several small  bowel loops. There is an abrupt caliber transition within the hernia sac likely the site of obstruction. Mild air within the colon as the rectosigmoid colon is decompressed. Vascular/Lymphatic: Subtle calcified plaque at the aortic bifurcation. No evidence of adenopathy. Reproductive: Mild prominence of the uterus with ill-defined endometrium compatible recent C-section. Ovaries are within normal. Other: No significant free fluid. Subcutaneous edema over the lower anterior abdominal wall and anterior pelvic wall. Musculoskeletal: Within normal. IMPRESSION: Evidence of distal small bowel obstruction with site of obstruction within a moderate size ventral hernia sac over the right lower quadrant. Mild cholelithiasis. Electronically Signed   By: Marin Olp M.D.   On: 06/19/2016 10:44     Assessment/Plan  CT and labs personally reviewed and agree with the diagnosis of bowel obstruction secondary to ventral hernia in a patient who is approximately 7 days postop from C-section. She will require surgery today for repair of this ventral hernia and may in fact require mesh either a biologic or a synthetic mesh. Rationale for this was discussed with her the options of observation reviewed the risk bleeding infection recurrence mesh placement mesh infection were discussed with her she understood and agreed to proceed  Florene Glen, MD, FACS

## 2016-06-19 NOTE — ED Provider Notes (Signed)
[facility] Emergency Department Provider Note    None    (approximate)  I have reviewed the triage vital signs and the nursing notes.   HISTORY  Chief Complaint Abdominal Pain; Nausea; and Emesis    HPI [name] is a 30 y.o. female with a history of anxiety and depression presents one week status post C-section with recurrent diffuse abdominal pain, back pain, nausea and vomiting. States she has not moved her bowels in 4 days. Denies any fevers. Does describes worsening crampy right lower quadrant pain. Has had decreased oral intake. Was seen here yesterday for similar symptoms discharged with oral medications but has not been able to get the filled.  Over the past 24 hours her symptoms have worsened and she is having persistent nausea and vomiting. Describes the vomit is nonbloody and non-ileus. She is not breast-feeding. Denies any foul-smelling lochia. No significant vaginal bleeding. Denies any dysuria.   Past Medical History:  Diagnosis Date  . Anxiety   . Depression     Patient Active Problem List   Diagnosis Date Noted  . Post-operative state 06/13/2016  . Abdominal pain affecting pregnancy, antepartum 02/28/2016    Past Surgical History:  Procedure Laterality Date  . CESAREAN SECTION N/A 06/12/2016   Procedure: STAT CESAREAN SECTION WITH BILATERAL STERILIZATION;  Surgeon: Suzy Bouchard, MD;  Location: [facility] ORS;  Service: Obstetrics;  Laterality: N/A;  Baby Girl @ 2246 Weight 5 lb 10 oz Apgars 8/9    Prior to Admission medications   Medication Sig Start Date End Date Taking? Authorizing Provider  ascorbic acid (VITAMIN C) 250 MG tablet Take 1 tablet (250 mg total) by mouth 2 (two) times daily with a meal. Take with iron for anemia 06/15/16 08/14/16  Christeen Douglas, MD  docusate sodium (COLACE) 100 MG capsule Take 1 capsule (100 mg total) by mouth daily as needed for mild constipation. 06/15/16   Christeen Douglas, MD  ferrous  sulfate 325 (65 FE) MG tablet Take 1 tablet (325 mg total) by mouth 2 (two) times daily with a meal. For anemia, take with Vitamin C 06/15/16 08/14/16  Christeen Douglas, MD  fluconazole (DIFLUCAN) 150 MG tablet Take 1 tablet (150 mg total) by mouth daily. Take 1 tablet in 1 week if still having yeast symptoms Patient not taking: Reported on 06/12/2016 02/28/16   Karena Addison, CNM  ibuprofen (ADVIL,MOTRIN) 800 MG tablet Take 1 tablet (800 mg total) by mouth every 8 (eight) hours as needed for moderate pain or cramping. 06/15/16 06/22/16  Christeen Douglas, MD  metroNIDAZOLE (METROGEL) 0.75 % vaginal gel Place 1 Applicatorful vaginally at bedtime. For bacterial vaginosis Patient not taking: Reported on 06/12/2016 02/28/16   Karena Addison, CNM  ondansetron (ZOFRAN) 4 MG tablet Take 1 tablet (4 mg total) by mouth every 8 (eight) hours as needed for nausea or vomiting. 06/17/16   Jeanmarie Plant, MD  oxyCODONE-acetaminophen (PERCOCET) 5-325 MG tablet Take 1-2 tablets by mouth every 6 (six) hours as needed for severe pain. 06/15/16   Christeen Douglas, MD    Allergies Review of patient's allergies indicates no known allergies.  No family history on file.  Social History Social History  Substance Use Topics  . Smoking status: Current Every Day Smoker    Packs/day: 0.50    Types: Cigarettes  . Smokeless tobacco: Current User  . Alcohol use 4.2 oz/week    7 Cans of beer per week     Comment: 40oz/day  Review of Systems Patient denies headaches, rhinorrhea, blurry vision, numbness, shortness of breath, chest pain, edema, cough, abdominal pain, nausea, vomiting, diarrhea, dysuria, fevers, rashes or hallucinations unless otherwise stated above in HPI. ____________________________________________   PHYSICAL EXAM:  VITAL SIGNS: Vitals:   06/19/16 0305  BP: 135/90  Resp: 20  Temp: 97.9 F (36.6 C)    Constitutional: Alert and oriented. Well appearing and in no acute distress. Eyes: Conjunctivae  are normal. PERRL. EOMI. Head: Atraumatic. Nose: No congestion/rhinnorhea. Mouth/Throat: Mucous membranes are moist.  Oropharynx non-erythematous. Neck: No stridor. Painless ROM. No cervical spine tenderness to palpation Hematological/Lymphatic/Immunilogical: No cervical lymphadenopathy. Cardiovascular: Normal rate, regular rhythm. Grossly normal heart sounds.  Good peripheral circulation. Respiratory: Normal respiratory effort.  No retractions. Lungs CTAB. Gastrointestinal: Soft and Mild tenderness to palpation right lower and suprapubic quadrants with area of ecchymosis of the pannus.  Possible hernia palpated but limited exam based on pannus Surgical site appears clean dry and intact.. Genitourinary:  Musculoskeletal: No lower extremity tenderness nor edema.  No joint effusions. Neurologic:  Normal speech and language. No gross focal neurologic deficits are appreciated. No gait instability. Skin:  Skin is warm, dry and intact. No rash noted. Psychiatric: Mood and affect are normal. Speech and behavior are normal.  ____________________________________________   LABS (all labs ordered are listed, but only abnormal results are displayed)  Results for orders placed or performed during the hospital encounter of 06/19/16 (from the past 24 hour(s))  Lipase, blood     Status: None   Collection Time: 06/19/16  3:08 AM  Result Value Ref Range   Lipase 14 11 - 51 U/L  Comprehensive metabolic panel     Status: Abnormal   Collection Time: 06/19/16  3:08 AM  Result Value Ref Range   Sodium 133 (L) 135 - 145 mmol/L   Potassium 3.1 (L) 3.5 - 5.1 mmol/L   Chloride 97 (L) 101 - 111 mmol/L   CO2 24 22 - 32 mmol/L   Glucose, Bld 138 (H) 65 - 99 mg/dL   BUN 10 6 - 20 mg/dL   Creatinine, Ser 7.840.53 0.44 - 1.00 mg/dL   Calcium 9.3 8.9 - 69.610.3 mg/dL   Total Protein 7.8 6.5 - 8.1 g/dL   Albumin 3.2 (L) 3.5 - 5.0 g/dL   AST 17 15 - 41 U/L   ALT 15 14 - 54 U/L   Alkaline Phosphatase 64 38 - 126 U/L    Total Bilirubin 0.6 0.3 - 1.2 mg/dL   GFR calc non Af Amer >60 >60 mL/min   GFR calc Af Amer >60 >60 mL/min   Anion gap 12 5 - 15  CBC     Status: Abnormal   Collection Time: 06/19/16  3:08 AM  Result Value Ref Range   WBC 7.7 3.6 - 11.0 K/uL   RBC 4.42 3.80 - 5.20 MIL/uL   Hemoglobin 11.9 (L) 12.0 - 16.0 g/dL   HCT 29.533.6 (L) 28.435.0 - 13.247.0 %   MCV 75.9 (L) 80.0 - 100.0 fL   MCH 26.9 26.0 - 34.0 pg   MCHC 35.4 32.0 - 36.0 g/dL   RDW 44.014.7 (H) 10.211.5 - 72.514.5 %   Platelets 531 (H) 150 - 440 K/uL  Urinalysis complete, with microscopic ([facility] only)     Status: Abnormal   Collection Time: 06/19/16 10:00 AM  Result Value Ref Range   Color, Urine AMBER (A) YELLOW   APPearance HAZY (A) CLEAR   Glucose, UA NEGATIVE NEGATIVE mg/dL   Bilirubin Urine  1+ (A) NEGATIVE   Ketones, ur 1+ (A) NEGATIVE mg/dL   Specific Gravity, Urine 1.036 (H) 1.005 - 1.030   Hgb urine dipstick 3+ (A) NEGATIVE   pH 5.0 5.0 - 8.0   Protein, ur 100 (A) NEGATIVE mg/dL   Nitrite NEGATIVE NEGATIVE   Leukocytes, UA 2+ (A) NEGATIVE   RBC / HPF TOO NUMEROUS TO COUNT 0 - 5 RBC/hpf   WBC, UA TOO NUMEROUS TO COUNT 0 - 5 WBC/hpf   Bacteria, UA NONE SEEN NONE SEEN   Squamous Epithelial / LPF 6-30 (A) NONE SEEN   Trans Epithel, UA 1    Mucous PRESENT    ____________________________________________  ____________________________________________  RADIOLOGY  CT abd: IMPRESSION: Evidence of distal small bowel obstruction with site of obstruction within a moderate size ventral hernia sac over the right lower quadrant.  Mild cholelithiasis.   ____________________________________________   PROCEDURES  Procedure(s) performed: none    Critical Care performed: no ____________________________________________   INITIAL IMPRESSION / ASSESSMENT AND PLAN / ED COURSE  Pertinent labs & imaging results that were available during my care of the patient were reviewed by me and considered in my medical decision making (see  chart for details).  DDX: Appendicitis, cholelithiasis, cholecystitis, ileus, obstruction, endometritis, postop pain  GIANNE SHUGARS is a 30 y.o. who presents to the ED with pain, nausea and vomiting status post C-section. Afebrile and hemodynamically stable but does have tenderness to palpation on the right lower quadrant. Based on presentation will order CT imaging to evaluate for any acute intra-abdominal pathology. Patient will be given IV fluid for resuscitation.  The patient will be placed on continuous pulse oximetry and telemetry for monitoring.  Laboratory evaluation will be sent to evaluate for the above complaints.     Clinical Course   Patient evaluated by general surgery Dr. Excell Seltzer at bedside. CT imaging does show evidence of small bowel obstruction likely secondary to acute ventral wall hernia. Patient with multiple doses of antiemetics and not tolerating hydration. Patient provided with nasal gastric tube placement for gastric decompression.  Based on her acute symptoms patient will require operative management. Patient taken to the OR by Dr. Excell Seltzer.  Have discussed with the patient and available family all diagnostics and treatments performed thus far and all questions were answered to the best of my ability. The patient demonstrates understanding and agreement with plan.   ____________________________________________   FINAL CLINICAL IMPRESSION(S) / ED DIAGNOSES  Final diagnoses:  Small bowel obstruction (HCC)  Intractable vomiting with nausea, vomiting of unspecified type  Hernia of anterior abdominal wall      NEW MEDICATIONS STARTED DURING THIS VISIT:  New Prescriptions   No medications on file     Note:  This document was prepared using Dragon voice recognition software and may include unintentional dictation errors.    Willy Eddy, MD 06/19/16 [phone]

## 2016-06-19 NOTE — ED Notes (Signed)
Patient called for assistance. NG was out. Tape was removed from gown where it was anchored. Patient states it came out accidentally. Patient refused to have another placed at this time. Dr. Roxan Hockeyobinson aware.

## 2016-06-19 NOTE — ED Notes (Signed)
0915--pt is due to start second bottle of po ct contrast.  She says she vomited just now.

## 2016-06-19 NOTE — Anesthesia Postprocedure Evaluation (Signed)
Anesthesia Post Note  Patient: [name]  Procedure(s) Performed: Procedure(s) (LRB): HERNIA REPAIR VENTRAL ADULT (N/A)  Patient location during evaluation: PACU Anesthesia Type: General Level of consciousness: awake and alert Pain management: pain level controlled Vital Signs Assessment: post-procedure vital signs reviewed and stable Respiratory status: spontaneous breathing, nonlabored ventilation and respiratory function stable Cardiovascular status: blood pressure returned to baseline and stable Postop Assessment: no signs of nausea or vomiting Anesthetic complications: no    Last Vitals:  Vitals:   06/19/16 1855 06/19/16 1910  BP: (!) 143/93 (!) 152/90  Pulse: (!) 106 (!) 103  Resp: (!) 21 (!) 24  Temp: 36.3 C     Last Pain:  Vitals:   06/19/16 1545  TempSrc: Oral  PainSc:                  Labrina Lines

## 2016-06-19 NOTE — Anesthesia Preprocedure Evaluation (Signed)
Anesthesia Evaluation  Patient identified by MRN, date of birth, ID band Patient awake    Reviewed: Allergy & Precautions, H&P , NPO status , Patient's Chart, lab work & pertinent test results  History of Anesthesia Complications Negative for: history of anesthetic complications  Airway Mallampati: III  TM Distance: >3 FB Neck ROM: full    Dental no notable dental hx. (+) Teeth Intact   Pulmonary neg sleep apnea, neg COPD, Current Smoker,    breath sounds clear to auscultation- rhonchi (-) wheezing      Cardiovascular Exercise Tolerance: Good (-) hypertension(-) CAD and (-) Past MI negative cardio ROS   Rhythm:Regular Rate:Normal - Systolic murmurs and - Diastolic murmurs    Neuro/Psych PSYCHIATRIC DISORDERS (Depression and anxiety) Anxiety Depression negative neurological ROS     GI/Hepatic negative GI ROS, Neg liver ROS,   Endo/Other  neg diabetesMorbid obesity  Renal/GU negative Renal ROS  negative genitourinary   Musculoskeletal   Abdominal (+) + obese,   Peds  Hematology negative hematology ROS (+)   Anesthesia Other Findings Past Medical History: No date: Anxiety No date: Depression   Reproductive/Obstetrics                             Anesthesia Physical  Anesthesia Plan  ASA: III  Anesthesia Plan: General   Post-op Pain Management:    Induction: Intravenous  Airway Management Planned: Oral ETT  Additional Equipment:   Intra-op Plan:   Post-operative Plan: Extubation in OR  Informed Consent: I have reviewed the patients History and Physical, chart, labs and discussed the procedure including the risks, benefits and alternatives for the proposed anesthesia with the patient or authorized representative who has indicated his/her understanding and acceptance.   Dental Advisory Given  Plan Discussed with: Anesthesiologist, CRNA and Surgeon  Anesthesia Plan Comments:          Anesthesia Quick Evaluation

## 2016-06-19 NOTE — Anesthesia Procedure Notes (Signed)
Procedure Name: Intubation Date/Time: 06/19/2016 5:45 PM Performed by: Omer JackWEATHERLY, Joy Haegele Pre-anesthesia Checklist: Patient identified, Patient being monitored, Timeout performed, Emergency Drugs available and Suction available Patient Re-evaluated:Patient Re-evaluated prior to inductionOxygen Delivery Method: Circle system utilized Preoxygenation: Pre-oxygenation with 100% oxygen Intubation Type: IV induction and Rapid sequence Ventilation: Mask ventilation without difficulty Laryngoscope Size: Miller and 2 Grade View: Grade I Tube type: Oral Tube size: 7.0 mm Number of attempts: 1 Placement Confirmation: ETT inserted through vocal cords under direct vision,  positive ETCO2 and breath sounds checked- equal and bilateral Secured at: 21 cm Tube secured with: Tape Dental Injury: Teeth and Oropharynx as per pre-operative assessment

## 2016-06-19 NOTE — Progress Notes (Signed)
Preoperative Review   Patient is met in the preoperative holding area. The history is reviewed in the chart and with the patient. I personally reviewed the options and rationale as well as the risks of this procedure that have been previously discussed with the patient. All questions asked by the patient and/or family were answered to their satisfaction.  Patient agrees to proceed with this procedure at this time.  Stanley Helmuth E Nery Kalisz M.D. FACS  

## 2016-06-19 NOTE — Transfer of Care (Signed)
Immediate Anesthesia Transfer of Care Note  Patient: [name]  Procedure(s) Performed: Procedure(s): HERNIA REPAIR VENTRAL ADULT (N/A)  Patient Location: PACU  Anesthesia Type:General  Level of Consciousness: awake and alert   Airway & Oxygen Therapy: Patient Spontanous Breathing and Patient connected to face mask oxygen  Post-op Assessment: Report given to RN and Post -op Vital signs reviewed and stable  Post vital signs: Reviewed and stable  Last Vitals:  Vitals:   06/19/16 1545 06/19/16 1855  BP: (!) 147/86 (!) 143/93  Pulse: (!) 103 (!) 106  Resp:  (!) 21  Temp: 36.8 C 36.3 C    Last Pain:  Vitals:   06/19/16 1545  TempSrc: Oral  PainSc:          Complications: No apparent anesthesia complications

## 2016-06-19 NOTE — Progress Notes (Signed)
Patient feels better now but she's had some pain medication and nasogastric tube is been placed proximally 400 cc removed.  A 5 patient of delay in the operating room and that Dr. Orvis BrillLoflin may perform her surgery depending on the timing of start. Patient understood and agreed

## 2016-06-19 NOTE — Op Note (Signed)
Abdominal Hernia Repair  Pre-operative Diagnosis: Incarcerated ventral hernia with bowel obstruction  Post-operative Diagnosis: Same  Procedure: Repair of incarcerated ventral hernia with small bowel obstruction  Surgeon: Adah Salvageichard E. Excell Seltzerooper, MD FACS  Anesthesia: Gen. with endotracheal tube  Assistant: Surgical tech  Procedure Details  The patient was seen again in the Holding Room. The benefits, complications, treatment options, and expected outcomes were discussed with the patient. The risks of bleeding, infection, recurrence of symptoms, failure to resolve symptoms, bowel injury, mesh placement, mesh infection, any of which could require further surgery were reviewed with the patient. The likelihood of improving the patient's symptoms with return to their baseline status is good.  The patient and/or family concurred with the proposed plan, giving informed consent.  The patient was taken to Operating Room, identified as [name] and the procedure verified.  A Time Out was held and the above information confirmed.  Prior to the induction of general anesthesia, antibiotic prophylaxis was administered. VTE prophylaxis was in place. General endotracheal anesthesia was then administered and tolerated well. After the induction, the abdomen was prepped with Chloraprep and draped in the sterile fashion. The patient was positioned in the supine position.  Patient had had a recent Pfannenstiel incision which was well-healed therefore it was decided to not open that area and perform a midline incision midline incision was executed and the subcutaneous fat was very hemorrhagic and there was a thin edematous fluid present which was cultured. Further dissection down to the fascia was performed the fascia was opened and the abdominal cavity opened there was a large amount of small bowel as seen on the CT scan identified in a right lower quadrant suture hernia dehiscence above the rectus muscle. This was  reduced and in so doing adhesions were identified in the small bowel and a small serosal tear due to traction was encountered this was repaired with Lembert type inverting 3-0 silk sutures. No full-thickness injury was identified. An area of obstruction was identified as well which was viable. The small bowel was run through this area to ensure that no other identifiable processes needed correction.  A 10 mm JP drain was brought in through a separate incision and placed into this subcutaneous empty space to prevent seroma and was held in with a 3-0 nylon.  The ventral hernia was repaired utilizing 0 Prolene sutures in a figure-of-eight fashion reapproximating the fascial rent. Then once assuring that the sponge lap needle count was correct and the small bowel was normal the wound was closed with figure-of-eight 0 Prolenes as well. Markings of sutures into the skin and subcutaneous tissues tissues and skin staples were placed over a quarter inch Penrose drain which was held in with staples.  At this point the sponge lap needle count was correct the sterile dressing was placed she was taken the recovery room in stable condition to be admitted for continued care.  Findings:  Incarcerated small bowel and a dehiscence, ventral hernia right lateral  Estimated Blood Loss: Minimal         Drains: Quarter-inch Penrose drain 1 and JP drain 10 mm 1         Specimens: Cultures         Complications: none               [name] E. Excell Seltzerooper, MD, FACS

## 2016-06-19 NOTE — ED Notes (Signed)
Patient was seen for n/v yesterday and has been unable to get her prescriptions filled.  She comes back tonight with the same upper and lower abdominal complaint but is now reporting her middle right and left back are hurting.  She states she drinks mostly soda and has a family hx of gallstones and KS.  She has a post-op prescription of (20) 5/325 oxycodone she has taken 8 of but reports no relief from this abdominal pain.  She has had no problems using the bathroom.

## 2016-06-19 NOTE — ED Notes (Signed)
Notified by lab there was not enough urine obtained to perform urinalysis.

## 2016-06-19 NOTE — ED Notes (Signed)
Patient given ED briefs and two pads.

## 2016-06-20 ENCOUNTER — Encounter: Payer: Self-pay | Admitting: Surgery

## 2016-06-20 MED ORDER — KETOROLAC TROMETHAMINE 30 MG/ML IJ SOLN
30.0000 mg | Freq: Four times a day (QID) | INTRAMUSCULAR | Status: DC
  Administered 2016-06-21 – 2016-06-24 (×15): 30 mg via INTRAVENOUS
  Filled 2016-06-20 (×15): qty 1

## 2016-06-20 MED ORDER — KCL IN DEXTROSE-NACL 20-5-0.45 MEQ/L-%-% IV SOLN
INTRAVENOUS | Status: DC
  Administered 2016-06-20 – 2016-06-23 (×7): via INTRAVENOUS
  Filled 2016-06-20 (×12): qty 1000

## 2016-06-20 MED ORDER — INFLUENZA VAC SPLIT QUAD 0.5 ML IM SUSY
0.5000 mL | PREFILLED_SYRINGE | INTRAMUSCULAR | Status: AC
  Administered 2016-06-21: 0.5 mL via INTRAMUSCULAR
  Filled 2016-06-20: qty 0.5

## 2016-06-20 MED ORDER — LACTATED RINGERS IV BOLUS (SEPSIS)
1000.0000 mL | Freq: Once | INTRAVENOUS | Status: AC
  Administered 2016-06-20: 1000 mL via INTRAVENOUS

## 2016-06-20 NOTE — Plan of Care (Signed)
Problem: Fluid Volume: Goal: Ability to maintain a balanced intake and output will improve Outcome: Not Progressing Patient is NPO.

## 2016-06-20 NOTE — Progress Notes (Signed)
1 Day Post-Op  Subjective: Status post repair of incarcerated ventral hernia. Patient's pain is tolerable she is not passing any gas yet has no nausea and a nasogastric tube is in place  Objective: Vital signs in last 24 hours: Temp:  [97.3 F (36.3 C)-99.1 F (37.3 C)] 99.1 F (37.3 C) (09/12 0510) Pulse Rate:  [92-106] 99 (09/12 0510) Resp:  [14-29] 22 (09/12 0721) BP: (120-155)/(71-97) 129/71 (09/12 0510) SpO2:  [95 %-100 %] 96 % (09/12 0721) Weight:  [237 lb (107.5 kg)] 237 lb (107.5 kg) (09/11 1545) Last BM Date:  (pt unsure)  Intake/Output from previous day: 09/11 0701 - 09/12 0700 In: 940 [I.V.:850; NG/GT:30] Out: 3685 [Urine:430; Emesis/NG output:2800; Drains:45; Blood:10] Intake/Output this shift: No intake/output data recorded.  Physical exam:  Abdomen is soft nontender wound is dressed drain is serous abs are nontender  Lab Results: CBC   Recent Labs  06/17/16 1306 06/19/16 0308  WBC 7.3 7.7  HGB 11.2* 11.9*  HCT 32.5* 33.6*  PLT 431 531*   BMET  Recent Labs  06/17/16 1306 06/19/16 0308  NA 135 133*  K 3.5 3.1*  CL 101 97*  CO2 24 24  GLUCOSE 113* 138*  BUN 9 10  CREATININE 0.31* 0.53  CALCIUM 9.2 9.3   PT/INR No results for input(s): LABPROT, INR in the last 72 hours. ABG No results for input(s): PHART, HCO3 in the last 72 hours.  Invalid input(s): PCO2, PO2  Studies/Results: Ct Abdomen Pelvis W Contrast  Result Date: 06/19/2016 CLINICAL DATA:  C-section 4 days ago. Abdominal and back pain. Vomiting 2 days. EXAM: CT ABDOMEN AND PELVIS WITH CONTRAST TECHNIQUE: Multidetector CT imaging of the abdomen and pelvis was performed using the standard protocol following bolus administration of intravenous contrast. CONTRAST:  100 mL Isovue 370 IV COMPARISON:  None. FINDINGS: Lower chest: Within normal. Hepatobiliary: Mild cholelithiasis. Liver and biliary tree are within normal. Pancreas: Within normal. Spleen: Within normal. Adrenals/Urinary Tract:  Adrenal glands are normal and symmetric. Kidneys an ureters are normal. Stomach/Bowel: Mild gastric distention. There are multiple dilated air and fluid-filled small bowel loops measuring up to 5.4 cm in diameter. The dilated loops extend to a ventral hernia over the right lower quadrant which contains several small bowel loops. There is an abrupt caliber transition within the hernia sac likely the site of obstruction. Mild air within the colon as the rectosigmoid colon is decompressed. Vascular/Lymphatic: Subtle calcified plaque at the aortic bifurcation. No evidence of adenopathy. Reproductive: Mild prominence of the uterus with ill-defined endometrium compatible recent C-section. Ovaries are within normal. Other: No significant free fluid. Subcutaneous edema over the lower anterior abdominal wall and anterior pelvic wall. Musculoskeletal: Within normal. IMPRESSION: Evidence of distal small bowel obstruction with site of obstruction within a moderate size ventral hernia sac over the right lower quadrant. Mild cholelithiasis. Electronically Signed   By: Elberta Fortisaniel  Boyle M.D.   On: 06/19/2016 10:44    Anti-infectives: Anti-infectives    Start     Dose/Rate Route Frequency Ordered Stop   06/19/16 1549  ceFAZolin (ANCEF) IVPB 2g/100 mL premix     2 g 200 mL/hr over 30 Minutes Intravenous 30 min pre-op 06/19/16 1549 06/19/16 1749      Assessment/Plan: s/p Procedure(s): HERNIA REPAIR VENTRAL ADULT   Patient doing well postop incarcerated ventral hernia repair will continue nasogastric tube until bowel function returns.  Lattie Hawichard E Markeis Allman, MD, FACS  06/20/2016

## 2016-06-20 NOTE — Progress Notes (Signed)
Doctor Excell Seltzerooper notified that pt has only had an output of 275 cc of urine in the last 9 hours and that pt.'s NG tube has put out a total of 1350 cc of green bile in the last 9 hours. New orders for pt to receive one bolus of LR, then place pt on  Continuous fluids, D5%-0.45% with 20 KCL mEq when bolus is completed. Pt states that her pain is well controlled with PCA pump. Will continue to monitor.   Karsten RoLauren E Hobbs

## 2016-06-20 NOTE — Progress Notes (Signed)
Visited patient. She states that she feels so much better than she did preoperatively and wants to drink but she is having considerable NG output and is not passing any flatus yet. We'll advance diet once her nasogastric tube can be removed.

## 2016-06-21 LAB — CBC WITH DIFFERENTIAL/PLATELET
BASOS PCT: 0 %
Basophils Absolute: 0 10*3/uL (ref 0–0.1)
Eosinophils Absolute: 0.2 10*3/uL (ref 0–0.7)
Eosinophils Relative: 2 %
HEMATOCRIT: 29.6 % — AB (ref 35.0–47.0)
Hemoglobin: 10 g/dL — ABNORMAL LOW (ref 12.0–16.0)
Lymphocytes Relative: 28 %
Lymphs Abs: 2.8 10*3/uL (ref 1.0–3.6)
MCH: 26 pg (ref 26.0–34.0)
MCHC: 33.8 g/dL (ref 32.0–36.0)
MCV: 77.1 fL — AB (ref 80.0–100.0)
MONO ABS: 1.4 10*3/uL — AB (ref 0.2–0.9)
MONOS PCT: 15 %
NEUTROS ABS: 5.5 10*3/uL (ref 1.4–6.5)
Neutrophils Relative %: 55 %
Platelets: 460 10*3/uL — ABNORMAL HIGH (ref 150–440)
RBC: 3.83 MIL/uL (ref 3.80–5.20)
RDW: 14.2 % (ref 11.5–14.5)
WBC: 9.9 10*3/uL (ref 3.6–11.0)

## 2016-06-21 LAB — BASIC METABOLIC PANEL
Anion gap: 8 (ref 5–15)
BUN: 11 mg/dL (ref 6–20)
CALCIUM: 8.1 mg/dL — AB (ref 8.9–10.3)
CO2: 30 mmol/L (ref 22–32)
Chloride: 98 mmol/L — ABNORMAL LOW (ref 101–111)
Creatinine, Ser: 0.54 mg/dL (ref 0.44–1.00)
GFR calc Af Amer: >60 mL/min (ref >60)
GLUCOSE: 110 mg/dL — AB (ref 65–99)
Potassium: 2.9 mmol/L — ABNORMAL LOW (ref 3.5–5.1)
Sodium: 136 mmol/L (ref 135–145)

## 2016-06-21 LAB — MAGNESIUM: Magnesium: 1.8 mg/dL (ref 1.7–2.4)

## 2016-06-21 NOTE — Progress Notes (Signed)
Visit patient this afternoon she is feeling poorly because she drank almost a liter of carbonated [facility] as well as all of the clear liquids on her tray. She is passing gas and has not vomited. She does not want to advance her diet.  Drain is in place abdomen is obese but nontender  Plan to leave on clear liquids and [facility] nurse to [facility] the [facility] room and admonished the patient did not drink carbonated beverages and such high volumes.

## 2016-06-21 NOTE — Progress Notes (Signed)
2 Days Post-Op  Subjective: Status post repair of incarcerated ventral hernia she states she has no pain today and is started passing gas she's not had a bowel movement is not nauseated has no emesis. She feels well.  Objective: Vital signs in last 24 hours: Temp:  [97.6 F (36.4 C)-98.9 F (37.2 C)] 97.6 F (36.4 C) (09/13 0839) Pulse Rate:  [80-104] 80 (09/13 0839) Resp:  [19-26] 26 (09/13 0839) BP: (113-132)/(58-77) 122/64 (09/13 0839) SpO2:  [91 %-100 %] 96 % (09/13 0839) FiO2 (%):  [21 %] 21 % (09/13 0800) Last BM Date:  (unknown)  Intake/Output from previous day: 09/12 0701 - 09/13 0700 In: 2041.9 [P.O.:1080; I.V.:841.9; NG/GT:120] Out: 3665 [Urine:475; Emesis/NG output:3150; Drains:40] Intake/Output this shift: Total I/O In: 439.8 [P.O.:120; I.V.:319.8] Out: 300 [Emesis/NG output:300]  Physical exam:  Overly obese female patient vital signs are stable abdomen is soft and nontender drain is in place wound is dressed. nOn tender calves  Lab Results: CBC   Recent Labs  06/19/16 0308 06/21/16 0458  WBC 7.7 9.9  HGB 11.9* 10.0*  HCT 33.6* 29.6*  PLT 531* 460*   BMET  Recent Labs  06/19/16 0308 06/21/16 0458  NA 133* 136  K 3.1* 2.9*  CL 97* 98*  CO2 24 30  GLUCOSE 138* 110*  BUN 10 11  CREATININE 0.53 0.54  CALCIUM 9.3 8.1*   PT/INR No results for input(s): LABPROT, INR in the last 72 hours. ABG No results for input(s): PHART, HCO3 in the last 72 hours.  Invalid input(s): PCO2, PO2  Studies/Results: Ct Abdomen Pelvis W Contrast  Result Date: 06/19/2016 CLINICAL DATA:  C-section 4 days ago. Abdominal and back pain. Vomiting 2 days. EXAM: CT ABDOMEN AND PELVIS WITH CONTRAST TECHNIQUE: Multidetector CT imaging of the abdomen and pelvis was performed using the standard protocol following bolus administration of intravenous contrast. CONTRAST:  100 mL Isovue 370 IV COMPARISON:  None. FINDINGS: Lower chest: Within normal. Hepatobiliary: Mild  cholelithiasis. Liver and biliary tree are within normal. Pancreas: Within normal. Spleen: Within normal. Adrenals/Urinary Tract: Adrenal glands are normal and symmetric. Kidneys an ureters are normal. Stomach/Bowel: Mild gastric distention. There are multiple dilated air and fluid-filled small bowel loops measuring up to 5.4 cm in diameter. The dilated loops extend to a ventral hernia over the right lower quadrant which contains several small bowel loops. There is an abrupt caliber transition within the hernia sac likely the site of obstruction. Mild air within the colon as the rectosigmoid colon is decompressed. Vascular/Lymphatic: Subtle calcified plaque at the aortic bifurcation. No evidence of adenopathy. Reproductive: Mild prominence of the uterus with ill-defined endometrium compatible recent C-section. Ovaries are within normal. Other: No significant free fluid. Subcutaneous edema over the lower anterior abdominal wall and anterior pelvic wall. Musculoskeletal: Within normal. IMPRESSION: Evidence of distal small bowel obstruction with site of obstruction within a moderate size ventral hernia sac over the right lower quadrant. Mild cholelithiasis. Electronically Signed   By: Elberta Fortisaniel  Boyle M.D.   On: 06/19/2016 10:44    Anti-infectives: Anti-infectives    Start     Dose/Rate Route Frequency Ordered Stop   06/19/16 1549  ceFAZolin (ANCEF) IVPB 2g/100 mL premix     2 g 200 mL/hr over 30 Minutes Intravenous 30 min pre-op 06/19/16 1549 06/19/16 1749      Assessment/Plan: s/p Procedure(s): HERNIA REPAIR VENTRAL ADULT   DC NG DC Foley clear liquid diet patient doing very well at this point  Lattie Hawichard E Cooper, MD, FACS  06/21/2016  

## 2016-06-22 NOTE — Progress Notes (Signed)
Pt has ambulated this am and has already vomited twice. Refuses anti nausea meds.

## 2016-06-22 NOTE — Progress Notes (Signed)
3 Days Post-Op  Subjective: Patient is not feels well today after taking the large amount of liquids including a considerable amount from a 2 L Mountain Dew bottle yesterday. She feels bloated but is passing some gas and is nauseated vomited one time last night after drinking water.  Objective: Vital signs in last 24 hours: Temp:  [97.6 F (36.4 C)-98.8 F (37.1 C)] 98.8 F (37.1 C) (09/14 0508) Pulse Rate:  [80-99] 99 (09/14 0508) Resp:  [13-27] 14 (09/14 0805) BP: (119-129)/(63-79) 121/79 (09/14 0508) SpO2:  [93 %-100 %] 96 % (09/14 0805) FiO2 (%):  [21 %] 21 % (09/13 1534) Last BM Date:  (unknown)  Intake/Output from previous day: 09/13 0701 - 09/14 0700 In: 2989.3 [P.O.:840; I.V.:2149.3] Out: 2455 [Urine:200; Emesis/NG output:2200; Drains:55] Intake/Output this shift: Total I/O In: 1318.5 [I.V.:1318.5] Out: -   Physical exam:  Wound is clean Penrose in place drain is in place abdomen is soft distended and obese but nontender Calves are nontender  Lab Results: CBC   Recent Labs  06/21/16 0458  WBC 9.9  HGB 10.0*  HCT 29.6*  PLT 460*   BMET  Recent Labs  06/21/16 0458  NA 136  K 2.9*  CL 98*  CO2 30  GLUCOSE 110*  BUN 11  CREATININE 0.54  CALCIUM 8.1*   PT/INR No results for input(s): LABPROT, INR in the last 72 hours. ABG No results for input(s): PHART, HCO3 in the last 72 hours.  Invalid input(s): PCO2, PO2  Studies/Results: No results found.  Anti-infectives: Anti-infectives    Start     Dose/Rate Route Frequency Ordered Stop   06/19/16 1549  ceFAZolin (ANCEF) IVPB 2g/100 mL premix     2 g 200 mL/hr over 30 Minutes Intravenous 30 min pre-op 06/19/16 1549 06/19/16 1749      Assessment/Plan: s/p Procedure(s): HERNIA REPAIR VENTRAL ADULT   Patient has had a setback in her progress probably secondary to large oral intake last night in the form of [facility] 2 L bottle. That liter bottle was removed after I discovered it being  utilized. I will keep her on clear liquids for now but may be ready for discharge in the next day or so.  Lattie Hawichard E Llewellyn Schoenberger, MD, FACS  06/22/2016

## 2016-06-23 MED ORDER — PROMETHAZINE HCL 25 MG/ML IJ SOLN
12.5000 mg | Freq: Four times a day (QID) | INTRAMUSCULAR | Status: DC | PRN
  Administered 2016-06-23: 12.5 mg via INTRAVENOUS
  Filled 2016-06-23: qty 1

## 2016-06-23 MED ORDER — ONDANSETRON HCL 4 MG/2ML IJ SOLN
4.0000 mg | Freq: Four times a day (QID) | INTRAMUSCULAR | Status: DC | PRN
  Administered 2016-06-23: 4 mg via INTRAVENOUS
  Filled 2016-06-23: qty 2

## 2016-06-23 MED ORDER — MORPHINE SULFATE (PF) 2 MG/ML IV SOLN
2.0000 mg | INTRAVENOUS | Status: DC | PRN
  Administered 2016-06-23 (×2): 2 mg via INTRAVENOUS
  Filled 2016-06-23 (×2): qty 1

## 2016-06-23 MED ORDER — OXYCODONE-ACETAMINOPHEN 5-325 MG PO TABS: 1.0000 | ORAL_TABLET | ORAL | Status: DC | PRN

## 2016-06-23 NOTE — Progress Notes (Signed)
4 Days Post-Op  Subjective: Status post ventral hernia repair. Patient states she does have mild nausea but has not vomited she feels better today.  Objective: Vital signs in last 24 hours: Temp:  [97.5 F (36.4 C)-98.7 F (37.1 C)] 97.5 F (36.4 C) (09/15 0800) Pulse Rate:  [86-95] 86 (09/15 0800) Resp:  [15-23] 20 (09/15 0816) BP: (113-126)/(78-84) 125/79 (09/15 0800) SpO2:  [96 %-100 %] 99 % (09/15 0816) FiO2 (%):  [99 %] 99 % (09/15 0816) Last BM Date: 06/18/16  Intake/Output from previous day: 09/14 0701 - 09/15 0700 In: 3793.7 [I.V.:3793.7] Out: 265 [Urine:250; Drains:15] Intake/Output this shift: No intake/output data recorded.  Physical exam:  Obese soft nontender abdomen wound is clean drain is functional but minimal output calves are nontender  Lab Results: CBC   Recent Labs  06/21/16 0458  WBC 9.9  HGB 10.0*  HCT 29.6*  PLT 460*   BMET  Recent Labs  06/21/16 0458  NA 136  K 2.9*  CL 98*  CO2 30  GLUCOSE 110*  BUN 11  CREATININE 0.54  CALCIUM 8.1*   PT/INR No results for input(s): LABPROT, INR in the last 72 hours. ABG No results for input(s): PHART, HCO3 in the last 72 hours.  Invalid input(s): PCO2, PO2  Studies/Results: No results found.  Anti-infectives: Anti-infectives    Start     Dose/Rate Route Frequency Ordered Stop   06/19/16 1549  ceFAZolin (ANCEF) IVPB 2g/100 mL premix     2 g 200 mL/hr over 30 Minutes Intravenous 30 min pre-op 06/19/16 1549 06/19/16 1749      Assessment/Plan: s/p Procedure(s): HERNIA REPAIR VENTRAL ADULT   Will advance diet possibly remove drain today. Soon  Lattie Hawichard E Valon Glasscock, MD, FACS  06/23/2016

## 2016-06-24 LAB — CBC
HEMATOCRIT: 31.5 % — AB (ref 35.0–47.0)
Hemoglobin: 11.1 g/dL — ABNORMAL LOW (ref 12.0–16.0)
MCH: 27 pg (ref 26.0–34.0)
MCHC: 35.1 g/dL (ref 32.0–36.0)
MCV: 76.7 fL — AB (ref 80.0–100.0)
Platelets: 528 10*3/uL — ABNORMAL HIGH (ref 150–440)
RBC: 4.1 MIL/uL (ref 3.80–5.20)
RDW: 14.7 % — ABNORMAL HIGH (ref 11.5–14.5)
WBC: 13 10*3/uL — AB (ref 3.6–11.0)

## 2016-06-24 MED ORDER — OXYCODONE-ACETAMINOPHEN 5-325 MG PO TABS: 1.0000 | ORAL_TABLET | ORAL | 0 refills | Status: DC | PRN

## 2016-06-24 NOTE — Discharge Instructions (Signed)
Dry dressing to incisions as needed No heavy lifting for 4 weeks Follow-up Dr. Excell Seltzerooper in 2 weeks Resume all home medications

## 2016-06-24 NOTE — Progress Notes (Signed)
With exertion, after pt walked around nurses station

## 2016-06-24 NOTE — Progress Notes (Signed)
5 Days Post-Op  Subjective: Patient status post ventral hernia repair she states that she is no longer nauseated and is willing to attempt an advancement of her diet. She has minimal pain if any.  Objective: Vital signs in last 24 hours: Temp:  [98.3 F (36.8 C)-98.4 F (36.9 C)] 98.4 F (36.9 C) (09/16 0432) Pulse Rate:  [86-96] 95 (09/16 0432) Resp:  [16-20] 20 (09/16 0432) BP: (118-124)/(69-89) 124/69 (09/16 0432) SpO2:  [97 %-99 %] 99 % (09/16 0432) Last BM Date: 06/18/16  Intake/Output from previous day: 09/15 0701 - 09/16 0700 In: 0  Out: 50 [Urine:50] Intake/Output this shift: No intake/output data recorded.  Physical exam:  Obese abdomen soft nontender Penrose drain in place with no significant drainage and JP in place as well.  Lab Results: CBC   Recent Labs  06/24/16 0549  WBC 13.0*  HGB 11.1*  HCT 31.5*  PLT 528*   BMET No results for input(s): NA, K, CL, CO2, GLUCOSE, BUN, CREATININE, CALCIUM in the last 72 hours. PT/INR No results for input(s): LABPROT, INR in the last 72 hours. ABG No results for input(s): PHART, HCO3 in the last 72 hours.  Invalid input(s): PCO2, PO2  Studies/Results: No results found.  Anti-infectives: Anti-infectives    Start     Dose/Rate Route Frequency Ordered Stop   06/19/16 1549  ceFAZolin (ANCEF) IVPB 2g/100 mL premix     2 g 200 mL/hr over 30 Minutes Intravenous 30 min pre-op 06/19/16 1549 06/19/16 1749      Assessment/Plan: s/p Procedure(s): HERNIA REPAIR VENTRAL ADULT   Patient doing fairly well at this point we'll advance diet possibly discharge either later today or tomorrow.  Lattie Hawichard E Laderius Valbuena, MD, FACS  06/24/2016

## 2016-06-24 NOTE — Progress Notes (Signed)
Pt to be discharged per MD order. IV removed. Instructions reviewed with pt. All questions answered. Will be taken out in wheelchair.  

## 2016-06-24 NOTE — Discharge Summary (Signed)
Physician Discharge Summary  Patient ID: [name] MRN: [id number] DOB/AGE: 30/12/1985 30 y.o.  Admit date: 06/19/2016 Discharge date: 06/24/2016   Discharge Diagnoses:  Active Problems:   Incisional ventral hernia w obstruction   Small bowel obstruction [facility](HCC)   Procedures:Repair of incarcerated ventral hernia  Hospital Course: This a postpartum patient. Was admitted to the hospital from the emergency room with a diagnosis of an incarcerated ventral hernia following a C-section with the history of a recent C-section and the findings at surgery this was actually an evisceration via fascial dehiscence. In the operating room primary repair was performed because mesh would be inappropriate in this patient with bowel outside of her abdominal cavity for considerable amount of time. Her bowel was viable and she made a non-complicated postoperative recovery is discharged in stable condition to follow-up in our office in 10 days or drains are been removed and she is instructed to shower and use a dry dressing as needed.  Consults: None  Disposition: 01-Home or Self Care     Medication List    STOP taking these medications   ibuprofen 800 MG tablet Commonly known as:  ADVIL,MOTRIN     TAKE these medications   ascorbic acid 250 MG tablet Commonly known as:  VITAMIN C Take 1 tablet (250 mg total) by mouth 2 (two) times daily with a meal. Take with iron for anemia   docusate sodium 100 MG capsule Commonly known as:  COLACE Take 1 capsule (100 mg total) by mouth daily as needed for mild constipation.   ferrous sulfate 325 (65 FE) MG tablet Take 1 tablet (325 mg total) by mouth 2 (two) times daily with a meal. For anemia, take with Vitamin C   fluconazole 150 MG tablet Commonly known as:  DIFLUCAN Take 1 tablet (150 mg total) by mouth daily. Take 1 tablet in 1 week if still having yeast symptoms   metroNIDAZOLE 0.75 % vaginal gel Commonly known as:  METROGEL Place 1  Applicatorful vaginally at bedtime. For bacterial vaginosis   ondansetron 4 MG tablet Commonly known as:  ZOFRAN Take 1 tablet (4 mg total) by mouth every 8 (eight) hours as needed for nausea or vomiting.   oxyCODONE-acetaminophen 5-325 MG tablet Commonly known as:  PERCOCET Take 1-2 tablets by mouth every 6 (six) hours as needed for severe pain. What changed:  Another medication with the same name was added. Make sure you understand how and when to take each.   oxyCODONE-acetaminophen 5-325 MG tablet Commonly known as:  PERCOCET/ROXICET Take 1 tablet by mouth every 4 (four) hours as needed for moderate pain. What changed:  You were already taking a medication with the same name, and this prescription was added. Make sure you understand how and when to take each.      Follow-up Information    Dionne Miloichard Orien Mayhall, MD Follow up in 2 week(s).   Specialty:  Surgery Contact information: [address] [place] [id number] [phone]           Lattie Hawichard E Nelson Noone, MD, FACS

## 2016-06-25 LAB — AEROBIC/ANAEROBIC CULTURE W GRAM STAIN (SURGICAL/DEEP WOUND)
Culture: NO GROWTH
Gram Stain: NONE SEEN

## 2016-06-25 LAB — AEROBIC/ANAEROBIC CULTURE (SURGICAL/DEEP WOUND)

## 2016-07-07 ENCOUNTER — Encounter: Payer: Self-pay | Admitting: Surgery

## 2016-07-07 ENCOUNTER — Ambulatory Visit (INDEPENDENT_AMBULATORY_CARE_PROVIDER_SITE_OTHER): Payer: Medicaid Other | Admitting: Surgery

## 2016-07-07 VITALS — BP 113/73 | HR 77 | Temp 99.7°F | Ht 64.0 in | Wt 239.0 lb

## 2016-07-07 DIAGNOSIS — K430 Incisional hernia with obstruction, without gangrene: Secondary | ICD-10-CM

## 2016-07-07 NOTE — Patient Instructions (Signed)
Please call our office if you have any questions or concerns.  

## 2016-07-07 NOTE — Progress Notes (Signed)
07/07/2016  HPI: Patient presents today for her postop follow-up visit after a repair of an incarcerated ventral hernia on 9/11 which had resulted in a small bowel obstruction. She was discharged to home on 9/16. Since then she reports that she is doing better. Her pain is improving as well. She is able tolerate a regular diet and has normal bowel function. Reports some occasional pain in her mid abdomen that comes and goes but otherwise no other symptoms.  Vital signs: BP 113/73 (BP Location: Right Arm, Patient Position: Sitting)   Pulse 77   Temp 99.7 F (37.6 C) (Oral)   Ht 5\' 4"  (1.626 m)   Wt 108.4 kg (239 lb)   BMI 41.02 kg/m    Physical Exam: Constitutional: No acute distress Cardiac:  Regular rhythm and rate Pulm: Lungs are clear bilaterally Abdomen: Abdomen is soft obese nondistended and minimally tender. Her previous C-section incision has healed well. Her midline incision from her ventral hernia repair is healing well with no complications and no signs of infection or breakdown. Her staples are still in place.  Assessment/Plan: [name] presents today for follow-up visit after repair of incarcerated ventral hernia. She is currently doing well. Her staples will be removed today. I reinforced with her the no weight lifting restrictions for 4 weeks after her surgery but otherwise she could do other activities as tolerated.  She may follow-up on an as-needed basis.   Howie IllJose Luis Siddarth Hsiung, MD [facility]

## 2016-07-14 ENCOUNTER — Emergency Department: Payer: Medicaid Other

## 2016-07-14 ENCOUNTER — Encounter: Payer: Self-pay | Admitting: Emergency Medicine

## 2016-07-14 ENCOUNTER — Emergency Department
Admission: EM | Admit: 2016-07-14 | Discharge: 2016-07-14 | Disposition: A | Discharge status: Home / Self Care | Payer: Medicaid Other | Attending: Emergency Medicine | Admitting: Emergency Medicine

## 2016-07-14 DIAGNOSIS — Z791 Long term (current) use of non-steroidal anti-inflammatories (NSAID): Secondary | ICD-10-CM | POA: Diagnosis not present

## 2016-07-14 DIAGNOSIS — M7989 Other specified soft tissue disorders: Secondary | ICD-10-CM | POA: Diagnosis present

## 2016-07-14 DIAGNOSIS — Z79899 Other long term (current) drug therapy: Secondary | ICD-10-CM | POA: Diagnosis not present

## 2016-07-14 DIAGNOSIS — R609 Edema, unspecified: Secondary | ICD-10-CM | POA: Insufficient documentation

## 2016-07-14 DIAGNOSIS — Z87891 Personal history of nicotine dependence: Secondary | ICD-10-CM | POA: Diagnosis not present

## 2016-07-14 LAB — COMPREHENSIVE METABOLIC PANEL
ALT: 30 U/L (ref 14–54)
AST: 51 U/L — ABNORMAL HIGH (ref 15–41)
Albumin: 2.8 g/dL — ABNORMAL LOW (ref 3.5–5.0)
Alkaline Phosphatase: 85 U/L (ref 38–126)
Anion gap: 6 (ref 5–15)
BUN: <5 mg/dL — ABNORMAL LOW (ref 6–20)
CALCIUM: 8.6 mg/dL — AB (ref 8.9–10.3)
CHLORIDE: 108 mmol/L (ref 101–111)
CO2: 24 mmol/L (ref 22–32)
CREATININE: 0.56 mg/dL (ref 0.44–1.00)
Glucose, Bld: 94 mg/dL (ref 65–99)
Potassium: 2.9 mmol/L — ABNORMAL LOW (ref 3.5–5.1)
Sodium: 138 mmol/L (ref 135–145)
Total Bilirubin: 0.7 mg/dL (ref 0.3–1.2)
Total Protein: 6.4 g/dL — ABNORMAL LOW (ref 6.5–8.1)

## 2016-07-14 LAB — CBC WITH DIFFERENTIAL/PLATELET
BASOS PCT: 1 %
Basophils Absolute: 0 10*3/uL (ref 0–0.1)
EOS ABS: 0.2 10*3/uL (ref 0–0.7)
Eosinophils Relative: 3 %
HCT: 24.8 % — ABNORMAL LOW (ref 35.0–47.0)
HEMOGLOBIN: 7.8 g/dL — AB (ref 12.0–16.0)
LYMPHS ABS: 2.2 10*3/uL (ref 1.0–3.6)
Lymphocytes Relative: 26 %
MCH: 23.8 pg — ABNORMAL LOW (ref 26.0–34.0)
MCHC: 31.5 g/dL — ABNORMAL LOW (ref 32.0–36.0)
MCV: 75.6 fL — ABNORMAL LOW (ref 80.0–100.0)
Monocytes Absolute: 0.8 10*3/uL (ref 0.2–0.9)
Monocytes Relative: 9 %
NEUTROS PCT: 61 %
Neutro Abs: 5.2 10*3/uL (ref 1.4–6.5)
Platelets: 467 10*3/uL — ABNORMAL HIGH (ref 150–440)
RBC: 3.28 MIL/uL — AB (ref 3.80–5.20)
RDW: 16 % — ABNORMAL HIGH (ref 11.5–14.5)
WBC: 8.4 10*3/uL (ref 3.6–11.0)

## 2016-07-14 LAB — URINALYSIS COMPLETE WITH MICROSCOPIC (ARMC ONLY)
Bilirubin Urine: NEGATIVE
Glucose, UA: NEGATIVE mg/dL
KETONES UR: NEGATIVE mg/dL
NITRITE: POSITIVE — AB
PROTEIN: NEGATIVE mg/dL
SPECIFIC GRAVITY, URINE: 1.013 (ref 1.005–1.030)
pH: 6 (ref 5.0–8.0)

## 2016-07-14 LAB — BRAIN NATRIURETIC PEPTIDE: B NATRIURETIC PEPTIDE 5: 97 pg/mL (ref 0.0–100.0)

## 2016-07-14 MED ORDER — FUROSEMIDE 20 MG PO TABS: 20.0000 mg | ORAL_TABLET | Freq: Every day | ORAL | 0 refills | Status: DC

## 2016-07-14 MED ORDER — POTASSIUM CHLORIDE ER 20 MEQ PO TBCR: 20.0000 meq | EXTENDED_RELEASE_TABLET | Freq: Every day | ORAL | 0 refills | Status: DC

## 2016-07-14 MED ORDER — POTASSIUM CHLORIDE CRYS ER 20 MEQ PO TBCR
20.0000 meq | EXTENDED_RELEASE_TABLET | Freq: Once | ORAL | Status: AC
  Administered 2016-07-14: 20 meq via ORAL
  Filled 2016-07-14: qty 1

## 2016-07-14 MED ORDER — CEPHALEXIN 500 MG PO CAPS: 500.0000 mg | ORAL_CAPSULE | Freq: Four times a day (QID) | ORAL | 0 refills | Status: AC

## 2016-07-14 NOTE — ED Triage Notes (Signed)
Pt presents to ED c/o lower limb swelling, 1+ pitting edema. Pt states she had 2 surgeries (c-section, repair) in September. Pt denies SOB. Pain 7/10

## 2016-07-14 NOTE — ED Provider Notes (Signed)
Time Seen: Approximately 1745  I have reviewed the triage notes  Chief Complaint: Leg Swelling   History of Present Illness: [name] is a 30 y.o. female who presents with some bilateral lower extremity swelling. Patient states she first noticed in her right leg and then now it is been occurring in her left leg. She states the swelling started at the top of her feet and then made its way up both lower extremities. She denies any swelling in the upper extremity. Patient significant past history is recent surgery for umbilical hernia and had a bowel obstruction. The patient states her surgical wound is been doing well and continues to drain as expected. She denies any fever. She has a history of anemia and is normally on iron supplement tablets and has not been taking them now for the last several weeks. The patient denies any melena or hematochezia. She denies any vaginal bleeding or discharge   Past Medical History:  Diagnosis Date  . Anxiety   . Depression     Patient Active Problem List   Diagnosis Date Noted  . Incisional ventral hernia w obstruction   . Small bowel obstruction   . Post-operative state 06/13/2016  . Abdominal pain affecting pregnancy, antepartum 02/28/2016  . IUFD (intrauterine fetal death) 2013/10/01  . Obesity 2013-10-01  . SIDS (sudden infant death syndrome) Oct 01, 2013  . Supervision of high risk pregnancy in third trimester 10-01-13  . Tobacco abuse 10-01-2013    Past Surgical History:  Procedure Laterality Date  . CESAREAN SECTION N/A 06/12/2016   Procedure: STAT CESAREAN SECTION WITH BILATERAL STERILIZATION;  Surgeon: Suzy Bouchard, MD;  Location: [facility] ORS;  Service: Obstetrics;  Laterality: N/A;  Baby Girl @ 2246 Weight 5 lb 10 oz Apgars 8/9  . VENTRAL HERNIA REPAIR N/A 06/19/2016   Procedure: HERNIA REPAIR VENTRAL ADULT;  Surgeon: Lattie Haw, MD;  Location: [facility] ORS;  Service: General;  Laterality: N/A;    Past Surgical  History:  Procedure Laterality Date  . CESAREAN SECTION N/A 06/12/2016   Procedure: STAT CESAREAN SECTION WITH BILATERAL STERILIZATION;  Surgeon: Suzy Bouchard, MD;  Location: [facility] ORS;  Service: Obstetrics;  Laterality: N/A;  Baby Girl @ 2246 Weight 5 lb 10 oz Apgars 8/9  . VENTRAL HERNIA REPAIR N/A 06/19/2016   Procedure: HERNIA REPAIR VENTRAL ADULT;  Surgeon: Lattie Haw, MD;  Location: [facility] ORS;  Service: General;  Laterality: N/A;    Current Outpatient Rx  . Order #: [id number] Class: Normal  . Order #: [id number] Class: Print  . Order #: [id number] Class: Normal  . Order #: [id number] Class: Normal  . Order #: [id number] Class: Print  . Order #: [id number] Class: Historical Med  . Order #: [id number] Class: Print    Allergies:  Review of patient's allergies indicates no known allergies.  Family History: Family History  Problem Relation Age of Onset  . Diabetes Mother   . Asthma Sister     Social History: Social History  Substance Use Topics  . Smoking status: Former Games developer  . Smokeless tobacco: Former Neurosurgeon  . Alcohol use No     Comment: 40oz/day     Review of Systems:   10 point review of systems was performed and was otherwise negative:  Constitutional: No fever Eyes: No visual disturbances ENT: No sore throat, ear pain Cardiac: No chest pain Respiratory: No shortness of breath, wheezing, or stridor Abdomen: No New abdominal pain, no vomiting, No diarrhea Endocrine: No weight loss, No night sweats Extremities: Swelling is  in both lower extremities, no cyanosis Skin: No rashes, easy bruising Neurologic: No focal weakness, trouble with speech or swollowing Urologic: No dysuria, Hematuria, or urinary frequency   Physical Exam:  ED Triage Vitals [07/14/16 1329]  Enc Vitals Group     BP 133/75     Pulse Rate 86     Resp 18     Temp 98.7 F (37.1 C)     Temp Source Oral     SpO2 100 %     Weight 240 lb (108.9 kg)     Height 5\' 3"  (1.6 m)     Head  Circumference      Peak Flow      Pain Score 7     Pain Loc      Pain Edu?      Excl. in GC?     General: Awake , Alert , and Oriented times 3; GCS 15 Head: Normal cephalic , atraumatic Eyes: Pupils equal , round, reactive to light Nose/Throat: No nasal drainage, patent upper airway without erythema or exudate.  Neck: Supple, Full range of motion, No anterior adenopathy or palpable thyroid masses Lungs: Clear to ascultation without wheezes , rhonchi, or rales Heart: Regular rate, regular rhythm without murmurs , gallops , or rubs Abdomen: Postoperative wound appears to be well healing well aligned with only a small serosanguineous discharge Soft, non tender without rebound, guarding , or rigidity; bowel sounds positive and symmetric in all 4 quadrants. No organomegaly .        Extremities: Bilateral circumferential edema left greater than the right with no calf tenderness or Homans sign Neurologic: normal ambulation, Motor symmetric without deficits, sensory intact Skin: warm, dry, no rashes Rectal exam with chaperone present was guaiac negative, normal sphincter tone, no palpable masses in the rectal vault  Labs:   All laboratory work was reviewed including any pertinent negatives or positives listed below:  Labs Reviewed  COMPREHENSIVE METABOLIC PANEL - Abnormal; Notable for the following:       Result Value   Potassium 2.9 (*)    BUN <5 (*)    Calcium 8.6 (*)    Total Protein 6.4 (*)    Albumin 2.8 (*)    AST 51 (*)    All other components within normal limits  CBC WITH DIFFERENTIAL/PLATELET - Abnormal; Notable for the following:    RBC 3.28 (*)    Hemoglobin 7.8 (*)    HCT 24.8 (*)    MCV 75.6 (*)    MCH 23.8 (*)    MCHC 31.5 (*)    RDW 16.0 (*)    Platelets 467 (*)    All other components within normal limits  URINALYSIS COMPLETEWITH MICROSCOPIC ([facility] ONLY) - Abnormal; Notable for the following:    Color, Urine YELLOW (*)    APPearance HAZY (*)    Hgb urine  dipstick 1+ (*)    Nitrite POSITIVE (*)    Leukocytes, UA 1+ (*)    Bacteria, UA MANY (*)    Squamous Epithelial / LPF 0-5 (*)    All other components within normal limits  URINE CULTURE  BRAIN NATRIURETIC PEPTIDE  Laboratory work is significant for a urinary tract infection, anemia, and low potassium  Radiology:  "Ct Abdomen Pelvis W Contrast  Result Date: 06/19/2016 CLINICAL DATA:  C-section 4 days ago. Abdominal and back pain. Vomiting 2 days. EXAM: CT ABDOMEN AND PELVIS WITH CONTRAST TECHNIQUE: Multidetector CT imaging of the abdomen and pelvis was performed using the  standard protocol following bolus administration of intravenous contrast. CONTRAST:  100 mL Isovue 370 IV COMPARISON:  None. FINDINGS: Lower chest: Within normal. Hepatobiliary: Mild cholelithiasis. Liver and biliary tree are within normal. Pancreas: Within normal. Spleen: Within normal. Adrenals/Urinary Tract: Adrenal glands are normal and symmetric. Kidneys an ureters are normal. Stomach/Bowel: Mild gastric distention. There are multiple dilated air and fluid-filled small bowel loops measuring up to 5.4 cm in diameter. The dilated loops extend to a ventral hernia over the right lower quadrant which contains several small bowel loops. There is an abrupt caliber transition within the hernia sac likely the site of obstruction. Mild air within the colon as the rectosigmoid colon is decompressed. Vascular/Lymphatic: Subtle calcified plaque at the aortic bifurcation. No evidence of adenopathy. Reproductive: Mild prominence of the uterus with ill-defined endometrium compatible recent C-section. Ovaries are within normal. Other: No significant free fluid. Subcutaneous edema over the lower anterior abdominal wall and anterior pelvic wall. Musculoskeletal: Within normal. IMPRESSION: Evidence of distal small bowel obstruction with site of obstruction within a moderate size ventral hernia sac over the right lower quadrant. Mild cholelithiasis.  Electronically Signed   By: Elberta Fortis M.D.   On: 06/19/2016 10:44   US Venous Img Lower Unilateral Left  Result Date: 07/14/2016 CLINICAL DATA:  Left lower limb swelling for 2 days. EXAM: LEFT LOWER EXTREMITY VENOUS DOPPLER ULTRASOUND TECHNIQUE: Gray-scale sonography with graded compression, as well as color Doppler and duplex ultrasound, were performed to evaluate the deep venous system from the level of the common femoral vein through the popliteal and proximal calf veins. Spectral Doppler was utilized to evaluate flow at rest and with distal augmentation maneuvers. COMPARISON:  None. FINDINGS: Right common femoral vein is patent without thrombus. Normal compressibility, augmentation and color Doppler flow in the left common femoral vein, left femoral vein and left popliteal vein. The left saphenofemoral junction is patent. Left profunda femoral vein is patent without thrombus. Visualized left deep calf veins are patent without thrombus. Prominent normal appearing lymph node in left groin measures 2.4 x 0.9 x 1.2 cm. IMPRESSION: Negative for deep venous thrombosis in left lower extremity. Electronically Signed   By: Richarda Overlie M.D.   On: 07/14/2016 14:44  "  I personally reviewed the radiologic studies    ED Course: * Patient's stay here was unremarkable and she is otherwise asymptomatic. The patient's anemia is likely due to iron deficiency anemia which he's had in the past and doesn't appear to have an acute gastrointestinal bleed. She's also likely to be fluid overloaded and I think her peripheral edema is most likely redistribution of her fluid at this point. Given the negative DVT study with her Doppler ultrasound I felt this was unlikely that this was vascular in nature. The patient is currently afebrile though she does have significant findings for urinary tract infection. She denies any complaints along these lines but I felt a course of Keflex would be appropriate. Her urine was negative  for a nephrotic or nephritic syndrome to explain her edema and her renal function appears to be normal at this time. Patient's potassium was low and she was supplemented here in emergency department since I'm going to place her on a chronic course of diuretics I felt that supplementing her on an outpatient basis with prescription for potassium would be adequate. Clinical Course     Assessment:  Postoperative peripheral edema Hypokalemia Anemia likely iron deficient    Final Clinical Impression:  Final diagnoses:  Peripheral edema  Plan: * Outpatient " Discharge Medication List as of 07/14/2016  6:59 PM    START taking these medications   Details  cephALEXin (KEFLEX) 500 MG capsule Take 1 capsule (500 mg total) by mouth 4 (four) times daily., Starting Fri 07/14/2016, Until Mon 07/24/2016, Print    furosemide (LASIX) 20 MG tablet Take 1 tablet (20 mg total) by mouth daily., Starting Fri 07/14/2016, Until Thu 07/20/2016, Print    potassium chloride 20 MEQ TBCR Take 20 mEq by mouth daily., Starting Fri 07/14/2016, Until Sat 07/22/2016, Print      " Patient was advised to return immediately if condition worsens. Patient was advised to follow up with their primary care physician or other specialized physicians involved in their outpatient care. The patient and/or family member/power of attorney had laboratory results reviewed at the bedside. All questions and concerns were addressed and appropriate discharge instructions were distributed by the nursing staff.             Jennye Moccasin, MD 07/14/16 [phone]

## 2016-07-14 NOTE — Discharge Instructions (Signed)
Return to emergency department especially for shortness of breath, fever, persistent vomiting, or any other new concerns. Please start back on your iron tablets. Drink plenty of fluids. Please contact your primary physician for further outpatient follow-up. Contact your surgeon for any concerns for infection of your incision, persistent vomiting, or any wound concerns.  Please return immediately if condition worsens. Please contact her primary physician or the physician you were given for referral. If you have any specialist physicians involved in her treatment and plan please also contact them. Thank you for using [facility].

## 2016-07-18 LAB — URINE CULTURE: Culture: >=100000 — AB

## 2016-07-18 NOTE — Progress Notes (Signed)
ED Culture Follow up  Urine culture with E Coli ESBL growing resistant to cefazolin/cephalexin, which was prescribed. Discussed with EDP Dr. Cyril LoosenKinner; decision made to switch abx to Macrobid 100 mg BID x10 days (#20). Spoke to pt (at [phone] and informed pt that we need to switch abx based on urine culture so to start taking new rx and stop taking previous one. Pt stated she just picked up the Keflex rx. Pt also had baby recently per chart. When asked pt states that she is not currently breastfeeding. Pt states she uses [facility]. New Rx called into [facility] at [phone].

## 2016-10-09 DIAGNOSIS — K8020 Calculus of gallbladder without cholecystitis without obstruction: Secondary | ICD-10-CM

## 2016-10-09 HISTORY — DX: Calculus of gallbladder without cholecystitis without obstruction: K80.20

## 2017-04-03 DIAGNOSIS — Z87891 Personal history of nicotine dependence: Secondary | ICD-10-CM | POA: Insufficient documentation

## 2017-04-03 DIAGNOSIS — K8020 Calculus of gallbladder without cholecystitis without obstruction: Secondary | ICD-10-CM | POA: Insufficient documentation

## 2017-04-03 DIAGNOSIS — K439 Ventral hernia without obstruction or gangrene: Secondary | ICD-10-CM | POA: Diagnosis not present

## 2017-04-03 DIAGNOSIS — R1033 Periumbilical pain: Secondary | ICD-10-CM | POA: Diagnosis present

## 2017-04-03 LAB — URINALYSIS, COMPLETE (UACMP) WITH MICROSCOPIC
BILIRUBIN URINE: NEGATIVE
GLUCOSE, UA: 50 mg/dL — AB
KETONES UR: NEGATIVE mg/dL
NITRITE: NEGATIVE
PH: 6 (ref 5.0–8.0)
PROTEIN: 100 mg/dL — AB
Specific Gravity, Urine: 1.023 (ref 1.005–1.030)

## 2017-04-03 LAB — CBC
HEMATOCRIT: 36.2 % (ref 35.0–47.0)
HEMOGLOBIN: 12.5 g/dL (ref 12.0–16.0)
MCH: 27.3 pg (ref 26.0–34.0)
MCHC: 34.5 g/dL (ref 32.0–36.0)
MCV: 79.1 fL — ABNORMAL LOW (ref 80.0–100.0)
Platelets: 342 10*3/uL (ref 150–440)
RBC: 4.58 MIL/uL (ref 3.80–5.20)
RDW: 14.8 % — ABNORMAL HIGH (ref 11.5–14.5)
WBC: 9.5 10*3/uL (ref 3.6–11.0)

## 2017-04-03 LAB — COMPREHENSIVE METABOLIC PANEL
ALBUMIN: 3.9 g/dL (ref 3.5–5.0)
ALT: 21 U/L (ref 14–54)
AST: 25 U/L (ref 15–41)
Alkaline Phosphatase: 59 U/L (ref 38–126)
Anion gap: 7 (ref 5–15)
BUN: 10 mg/dL (ref 6–20)
CO2: 21 mmol/L — AB (ref 22–32)
Calcium: 9 mg/dL (ref 8.9–10.3)
Chloride: 110 mmol/L (ref 101–111)
Creatinine, Ser: 0.63 mg/dL (ref 0.44–1.00)
GFR calc non Af Amer: >60 mL/min (ref >60)
GLUCOSE: 99 mg/dL (ref 65–99)
POTASSIUM: 3.3 mmol/L — AB (ref 3.5–5.1)
SODIUM: 138 mmol/L (ref 135–145)
Total Bilirubin: 0.6 mg/dL (ref 0.3–1.2)
Total Protein: 7.5 g/dL (ref 6.5–8.1)

## 2017-04-03 LAB — POCT PREGNANCY, URINE: Preg Test, Ur: NEGATIVE

## 2017-04-03 LAB — LIPASE, BLOOD: LIPASE: 21 U/L (ref 11–51)

## 2017-04-03 NOTE — ED Triage Notes (Signed)
Pt ambulatory to triage.  Pt reports abd pain.  No n/v/d.  Menses now. Pt states iit feels like something popped in my stomach.   Pt alert

## 2017-04-04 ENCOUNTER — Telehealth: Payer: Self-pay | Admitting: General Practice

## 2017-04-04 ENCOUNTER — Encounter: Payer: Self-pay | Admitting: Radiology

## 2017-04-04 ENCOUNTER — Emergency Department
Admission: EM | Admit: 2017-04-04 | Discharge: 2017-04-04 | Disposition: A | Discharge status: Home / Self Care | Payer: Medicaid Other | Attending: Emergency Medicine | Admitting: Emergency Medicine

## 2017-04-04 ENCOUNTER — Emergency Department: Payer: Medicaid Other

## 2017-04-04 DIAGNOSIS — K439 Ventral hernia without obstruction or gangrene: Secondary | ICD-10-CM

## 2017-04-04 DIAGNOSIS — K8020 Calculus of gallbladder without cholecystitis without obstruction: Secondary | ICD-10-CM

## 2017-04-04 MED ORDER — OXYCODONE-ACETAMINOPHEN 5-325 MG PO TABS: 1.0000 | ORAL_TABLET | ORAL | 0 refills | Status: DC | PRN

## 2017-04-04 MED ORDER — IOPAMIDOL (ISOVUE-300) INJECTION 61%
100.0000 mL | Freq: Once | INTRAVENOUS | Status: AC | PRN
  Administered 2017-04-04: 100 mL via INTRAVENOUS

## 2017-04-04 MED ORDER — OXYCODONE-ACETAMINOPHEN 5-325 MG PO TABS
1.0000 | ORAL_TABLET | Freq: Once | ORAL | Status: AC
  Administered 2017-04-04: 1 via ORAL
  Filled 2017-04-04: qty 1

## 2017-04-04 MED ORDER — ONDANSETRON 4 MG PO TBDP: 4.0000 mg | ORAL_TABLET | Freq: Three times a day (TID) | ORAL | 0 refills | Status: DC | PRN

## 2017-04-04 MED ORDER — IOPAMIDOL (ISOVUE-300) INJECTION 61%
30.0000 mL | Freq: Once | INTRAVENOUS | Status: AC
  Administered 2017-04-04: 30 mL via ORAL

## 2017-04-04 NOTE — ED Provider Notes (Signed)
[facility] Emergency Department Provider Note    First MD Initiated Contact with Patient 04/04/17 [phone]     (approximate)  I have reviewed the triage vital signs and the nursing notes.   HISTORY  Chief Complaint Abdominal Pain    HPI [name] is a 31 y.o. female presents with acute onset of periumbilical abdominal pain with onset tonight. Patient states that she "felt like something popped in my stomach". Patient admits to nausea and vomiting over no diarrhea constipation. Patient denies any fever  Past Medical History:  Diagnosis Date  . Anxiety   . Depression     Patient Active Problem List   Diagnosis Date Noted  . Incisional ventral hernia w obstruction   . Small bowel obstruction (HCC)   . Post-operative state 06/13/2016  . Abdominal pain affecting pregnancy, antepartum 02/28/2016  . IUFD (intrauterine fetal death) 2013-10-06  . Obesity 2013/10/06  . SIDS (sudden infant death syndrome) 10/06/2013  . Supervision of high risk pregnancy in third trimester October 06, 2013  . Tobacco abuse 06-Oct-2013    Past Surgical History:  Procedure Laterality Date  . CESAREAN SECTION N/A 06/12/2016   Procedure: STAT CESAREAN SECTION WITH BILATERAL STERILIZATION;  Surgeon: Suzy Bouchard, MD;  Location: [facility] ORS;  Service: Obstetrics;  Laterality: N/A;  Baby Girl @ 2246 Weight 5 lb 10 oz Apgars 8/9  . VENTRAL HERNIA REPAIR N/A 06/19/2016   Procedure: HERNIA REPAIR VENTRAL ADULT;  Surgeon: Lattie Haw, MD;  Location: [facility] ORS;  Service: General;  Laterality: N/A;    Prior to Admission medications   Medication Sig Start Date End Date Taking? Authorizing Provider  docusate sodium (COLACE) 100 MG capsule Take 1 capsule (100 mg total) by mouth daily as needed for mild constipation. 06/15/16   Christeen Douglas, MD  ferrous sulfate 325 (65 FE) MG tablet Take 1 tablet (325 mg total) by mouth 2 (two) times daily with a meal. For anemia, take with  Vitamin C 06/15/16 08/14/16  Christeen Douglas, MD  furosemide (LASIX) 20 MG tablet Take 1 tablet (20 mg total) by mouth daily. 07/14/16 07/20/16  Jennye Moccasin, MD  ibuprofen (ADVIL,MOTRIN) 800 MG tablet Take 800 mg by mouth every 8 (eight) hours as needed.    [provider]  potassium chloride 20 MEQ TBCR Take 20 mEq by mouth daily. 07/14/16 07/22/16  Jennye Moccasin, MD    Allergies No known drug allergies   Family History  Problem Relation Age of Onset  . Diabetes Mother   . Asthma Sister     Social History Social History  Substance Use Topics  . Smoking status: Former Games developer  . Smokeless tobacco: Former Neurosurgeon  . Alcohol use No     Comment: 40oz/day    Review of Systems Constitutional: No fever/chills Eyes: No visual changes. ENT: No sore throat. Cardiovascular: Denies chest pain. Respiratory: Denies shortness of breath. Gastrointestinal: Positive for abdominal pain and vomiting    No diarrhea.  No constipation. Genitourinary: Negative for dysuria. Musculoskeletal: Negative for neck pain.  Negative for back pain. Integumentary: Negative for rash. Neurological: Negative for headaches, focal weakness or numbness.   ____________________________________________   PHYSICAL EXAM:  VITAL SIGNS: ED Triage Vitals  Enc Vitals Group     BP 04/03/17 2226 138/70     Pulse Rate 04/03/17 2226 83     Resp 04/03/17 2226 18     Temp 04/03/17 2226 98.6 F (37 C)     Temp Source 04/03/17  2226 Oral     SpO2 04/03/17 2226 98 %     Weight 04/03/17 2225 120.2 kg (265 lb)     Height 04/03/17 2225 1.626 m (5\' 4" )     Head Circumference --      Peak Flow --      Pain Score 04/03/17 2225 10     Pain Loc --      Pain Edu? --      Excl. in GC? --     Constitutional: Alert and oriented. Well appearing and in no acute distress. Eyes: Conjunctivae are normal. Head: Atraumatic. Mouth/Throat: Mucous membranes are moist. Oropharynx non-erythematous. Neck: No  stridor. Cardiovascular: Normal rate, regular rhythm. Good peripheral circulation. Grossly normal heart sounds. Respiratory: Normal respiratory effort.  No retractions. Lungs CTAB. Gastrointestinal: Pain at the site of infraumbilical surgical incision scar that is well-healed. No distention.  Musculoskeletal: No lower extremity tenderness nor edema. No gross deformities of extremities. Neurologic:  Normal speech and language. No gross focal neurologic deficits are appreciated.  Skin:  Skin is warm, dry and intact. No rash noted. Psychiatric: Mood and affect are normal. Speech and behavior are normal.  ____________________________________________   LABS (all labs ordered are listed, but only abnormal results are displayed)  Labs Reviewed  COMPREHENSIVE METABOLIC PANEL - Abnormal; Notable for the following:       Result Value   Potassium 3.3 (*)    CO2 21 (*)    All other components within normal limits  CBC - Abnormal; Notable for the following:    MCV 79.1 (*)    RDW 14.8 (*)    All other components within normal limits  URINALYSIS, COMPLETE (UACMP) WITH MICROSCOPIC - Abnormal; Notable for the following:    Color, Urine RED (*)    APPearance TURBID (*)    Glucose, UA 50 (*)    Hgb urine dipstick MODERATE (*)    Protein, ur 100 (*)    Leukocytes, UA TRACE (*)    Bacteria, UA RARE (*)    Squamous Epithelial / LPF 6-30 (*)    All other components within normal limits  LIPASE, BLOOD  POC URINE PREG, ED  POCT PREGNANCY, URINE    RADIOLOGY I, [place] Kory Rains, personally viewed and evaluated these images (plain radiographs) as part of my medical decision making, as well as reviewing the written report by the radiologist.  Ct Abdomen Pelvis W Contrast  Result Date: 04/04/2017 CLINICAL DATA:  Bilateral lower quadrant abdominal pain for 1 day. EXAM: CT ABDOMEN AND PELVIS WITH CONTRAST TECHNIQUE: Multidetector CT imaging of the abdomen and pelvis was performed using the standard  protocol following bolus administration of intravenous contrast. CONTRAST:  ISOVUE-300 IOPAMIDOL (ISOVUE-300) INJECTION 61% COMPARISON:  06/19/2016 FINDINGS: Lower chest: No acute abnormality. Hepatobiliary: The gallbladder is contracted around multiple calculi. No bile duct dilatation. No focal liver lesion. Pancreas: Unremarkable. No pancreatic ductal dilatation or surrounding inflammatory changes. Spleen: Normal in size without focal abnormality. Adrenals/Urinary Tract: Adrenal glands are unremarkable. Kidneys are normal, without renal calculi, focal lesion, or hydronephrosis. Bladder is unremarkable. Stomach/Bowel: Stomach is within normal limits. Colon appears normal. No evidence of bowel wall thickening, distention, or inflammatory changes. Vascular/Lymphatic: The abdominal aorta is normal in caliber with mild atherosclerotic calcification. No adenopathy in the abdomen or pelvis. Reproductive: Uterus and bilateral adnexa are unremarkable. Other: Midline ventral hernias, one at the level of the umbilicus containing fat and a small volume fluid. Immediately below this is a larger ventral hernia containing  unobstructed small bowel. Musculoskeletal: No significant skeletal lesion. IMPRESSION: 1. Infraumbilical midline ventral hernia containing unobstructed small bowel. Fat containing umbilical hernia. 2. Negative for bowel obstruction or focal inflammation of bowel. 3. Cholelithiasis. 4. Aortic atherosclerosis. Electronically Signed   By: Ellery Plunkaniel R Mitchell M.D.   On: 04/04/2017 04:09     Procedures   ____________________________________________   INITIAL IMPRESSION / ASSESSMENT AND PLAN / ED COURSE  Pertinent labs & imaging results that were available during my care of the patient were reviewed by me and considered in my medical decision making (see chart for details).  [facility] will be prescribed same at home. Patient CT scan revealed a ventral abdominal  hernia with fat in the area. The patient also had evidence of cholelithiasis. Patient referred to Dr. Tonita CongWoodham for outpatient follow-up      ____________________________________________  FINAL CLINICAL IMPRESSION(S) / ED DIAGNOSES  Final diagnoses:  Gallstones  Ventral hernia without obstruction or gangrene     MEDICATIONS GIVEN DURING THIS VISIT:  Medications  iopamidol (ISOVUE-300) 61 % injection 30 mL (30 mLs Oral Contrast Given 04/04/17 0321)  iopamidol (ISOVUE-300) 61 % injection 100 mL (100 mLs Intravenous Contrast Given 04/04/17 0345)     NEW OUTPATIENT MEDICATIONS STARTED DURING THIS VISIT:  New Prescriptions   No medications on file    Modified Medications   No medications on file    Discontinued Medications   No medications on file     Note:  This document was prepared using Dragon voice recognition software and may include unintentional dictation errors.    Darci CurrentBrown, [place] N, MD 04/04/17 [phone]

## 2017-04-04 NOTE — Telephone Encounter (Signed)
Patient called said she would like to go ahead and scheduled surgery. Please call patient and schedule or does she need to come in for an appointment. Please advice.

## 2017-04-04 NOTE — Telephone Encounter (Signed)
Patients  Coming in on 7/10 for her appointment with Dr. Excell Seltzerooper.

## 2017-04-13 ENCOUNTER — Other Ambulatory Visit: Payer: Self-pay

## 2017-04-17 ENCOUNTER — Ambulatory Visit: Payer: Medicaid Other | Admitting: Surgery

## 2017-04-23 ENCOUNTER — Encounter: Payer: Self-pay | Admitting: Surgery

## 2017-04-23 ENCOUNTER — Ambulatory Visit (INDEPENDENT_AMBULATORY_CARE_PROVIDER_SITE_OTHER): Payer: Medicaid Other | Admitting: Surgery

## 2017-04-23 VITALS — BP 127/82 | HR 85 | Temp 98.1°F | Ht 64.0 in | Wt 238.4 lb

## 2017-04-23 DIAGNOSIS — K8020 Calculus of gallbladder without cholecystitis without obstruction: Secondary | ICD-10-CM

## 2017-04-23 DIAGNOSIS — K429 Umbilical hernia without obstruction or gangrene: Secondary | ICD-10-CM

## 2017-04-23 DIAGNOSIS — K432 Incisional hernia without obstruction or gangrene: Secondary | ICD-10-CM

## 2017-04-23 NOTE — Patient Instructions (Signed)
You have requested for your Umbilical and Lower Right Hernia be repaired. We will arrange this to be done on 05/07/2017 by Dr. Aleen CampiPiscoya at [facility].  Please see your (blue)pre-care sheet for information.  You will need to arrange to be off work for 1-2 weeks but will have to have a lifting restriction of no more than 15 lbs. For 6 weeks following your surgery.

## 2017-04-23 NOTE — Progress Notes (Signed)
04/23/2017  History of Present Illness: [name] is a 31 y.o. female who presents for follow up on abdominal pain.  She had presented to the ED on 6/27 with abdominal pain in the right upper quadrant and had an ultrasound was positive for cholelithiasis without evidence of cholecystitis. She also reports that she had been having episodes of pain in her lower abdomen and on CT scan was noted to have a ventral hernia likely from prior C-section as well as a fat containing umbilical hernia.    She reports however that since her visit to the emergency room. She has not had any other pain in the right upper quadrant and was a one-time only episode. However the pain and discomfort in the lower portion of her abdomen still persisting. Every day when she coughs or sneezes she notices bulging in the umbilical region consistent with her hernia as well as discomfort in the right lower quadrant. Reports some nausea but no vomiting or diarrhea related to food intake but has had some pain issues and is taking her oxycodone given the emergency room once daily. She feels that she needs to present her abdominal wall in order to help with the pain.  Past Medical History: Past Medical History:  Diagnosis Date  . Abdominal pain affecting pregnancy, antepartum 02/28/2016  . Anxiety   . Depression   . Incisional ventral hernia w obstruction   . Obesity 09/17/2013   Last Assessment & Plan:  Obesity in pregnancy is associated with maternal and obstetric complications including increased risk of gestational DM, gestational HTN, preeclampsia, thromboembolism, failed induction of labor, Cesarean section, excessive gestational weight gain and post-partum weight retention. Obesity is also associated with fetal complications including prematurity, stillbirth, congen  . Post-operative state 06/13/2016  . SIDS (sudden infant death syndrome) 10/03/2013   Last Assessment & Plan:  [name]'s third son died at three months of  age from SIDS. I reviewed with [name] that the cause of SIDS is unknown. I did review with her that placing infants to sleep on their backs without soft bedding and tobacco cessation are potentially modifiable risk factors to decrease her risk of SIDS.  Marland Kitchen. Small bowel obstruction (HCC)   . Supervision of high risk pregnancy in third trimester 09/17/2013   Last Assessment & Plan:  [name] [facility] She reports having an appointment tomorrow 12/3. I encouraged her to get a flu and TDAP vaccination. She is due for her CBC, RPR, third trimester HIV and glucola.   . Tobacco abuse 09/28/2013   Last Assessment & Plan:  I discussed with [name] that the risks of smoking in pregnancy include spontaneous pregnancy loss, placental abruption, preterm premature rupture of membranes (PPROM), placenta previa, preterm labor and delivery, and low birth weight (LBW) as well as SIDS. I discussed with her that her tobacco use is a modifiable risk factor for risk of placental abruption and SIDS, b     Past Surgical History: Past Surgical History:  Procedure Laterality Date  . CESAREAN SECTION N/A 06/12/2016   Procedure: STAT CESAREAN SECTION WITH BILATERAL STERILIZATION;  Surgeon: Suzy Bouchardhomas J Schermerhorn, MD;  Location: [facility] ORS;  Service: Obstetrics;  Laterality: N/A;  Baby Girl @ 2246 Weight 5 lb 10 oz Apgars 8/9  . TUBAL LIGATION    . VENTRAL HERNIA REPAIR N/A 06/19/2016   Procedure: HERNIA REPAIR VENTRAL ADULT;  Surgeon: Lattie Hawichard E Cooper, MD;  Location: [facility] ORS;  Service: General;  Laterality: N/A;    Home Medications: Prior to Admission medications   Medication Sig Start Date End Date Taking? Authorizing Provider  docusate sodium (COLACE) 100 MG capsule Take 1 capsule (100 mg total) by mouth daily as needed for mild constipation. 06/15/16  Yes [name], Bethany, MD  ferrous sulfate 325 (65 FE) MG tablet Take 325 mg by mouth.   Yes [provider]  ibuprofen  (ADVIL,MOTRIN) 800 MG tablet Take 800 mg by mouth every 8 (eight) hours as needed.   Yes [provider]  ondansetron (ZOFRAN ODT) 4 MG disintegrating tablet Take 1 tablet (4 mg total) by mouth every 8 (eight) hours as needed for nausea or vomiting. 04/04/17  Yes Darci CurrentBrown, Widener N, MD  oxyCODONE-acetaminophen (ROXICET) 5-325 MG tablet Take 1 tablet by mouth every 4 (four) hours as needed for severe pain. 04/04/17  Yes Darci CurrentBrown, Bellamy N, MD  ferrous sulfate 325 (65 FE) MG tablet Take 1 tablet (325 mg total) by mouth 2 (two) times daily with a meal. For anemia, take with Vitamin C 06/15/16 08/14/16  [name], Bethany, MD  furosemide (LASIX) 20 MG tablet Take 1 tablet (20 mg total) by mouth daily. 07/14/16 07/20/16  Jennye MoccasinQuigley, Brian S, MD  potassium chloride 20 MEQ TBCR Take 20 mEq by mouth daily. 07/14/16 07/22/16  Jennye MoccasinQuigley, Brian S, MD    Allergies: No Known Allergies  Social History:  reports that she has quit smoking. She has quit using smokeless tobacco. She reports that she does not drink alcohol or use drugs.   Family History: Family History  Problem Relation Age of Onset  . Diabetes Mother   . Asthma Sister   . Hypertension Maternal Aunt   . Hypertension Maternal Uncle     Review of Systems: Review of Systems  Constitutional: Negative for chills and fever.  HENT: Negative for hearing loss.   Respiratory: Negative for shortness of breath.   Cardiovascular: Negative for chest pain.  Gastrointestinal: Positive for abdominal pain and nausea. Negative for constipation, diarrhea and vomiting.  Genitourinary: Negative for dysuria.  Musculoskeletal: Negative for myalgias.  Skin: Negative for rash.  Neurological: Negative for dizziness.  Psychiatric/Behavioral: Negative for depression.  All other systems reviewed and are negative.   Physical Exam BP 127/82   Pulse 85   Temp 98.1 F (36.7 C) (Oral)   Ht 5\' 4"  (1.626 m)   Wt 108.1 kg (238 lb 6.4 oz)   LMP 04/03/2017   BMI 40.92  kg/m   CONSTITUTIONAL: No acute distress HEENT:  Normocephalic, atraumatic, extraocular motion intact. NECK: Trachea is midline, and there is no jugular venous distension.  RESPIRATORY:  Lungs are clear, and breath sounds are equal bilaterally. Normal respiratory effort without pathologic use of accessory muscles. CARDIOVASCULAR: Heart is regular without murmurs, gallops, or rubs. GI: The abdomen is soft, obese, nondistended, with mild discomfort to palpation in the lower abdomen as well as periumbilical region. She has approximate 3 cm defect palpable periumbilical region with either fat or bowel protruding although is very reducible. Her lower abdominal hernia is not as palpable given her body habitus but its evident on CT scan.  There is no tenderness in the epigastric region or in the right upper quadrant.  MUSCULOSKELETAL:  Normal muscle strength and tone in all four extremities.  No peripheral edema or cyanosis. SKIN: Skin turgor is normal. There are no pathologic skin lesions.  NEUROLOGIC:  Motor and sensation is grossly normal.  Cranial nerves are grossly intact. PSYCH:  Alert and oriented  to person, place and time. Affect is normal.  Labs/Imaging: CT scan from 6/27 showing an infraumbilical midline ventral hernia containing nonobstructed small bowel as well as a fat-containing umbilical hernia. Also shows cholelithiasis.  Assessment and Plan: This is a 31 y.o. female who presents with 2 hernias noted on CT scan after a visit to the emergency room for abdominal pain. I have viewed the patient's imaging studies independently and review her laboratories from her emergency room visit as well.  Patient has 2 hernias on exam and a CT scan. Discussed with the patient that even that she's had 2 abdominal surgeries already from which she develop hernias, we would try to approach this via laparoscopy instead of an open procedure. The hernias are close enough on the inside we may be able to fix  everything went well with one large piece of mesh but otherwise we would do 2 smaller pieces. Currently the patient is not having any right upper quadrant pain express the patient that at this point is no specific need to do any cholecystectomy procedures. Thus we will proceed only with ventral hernia repair with mesh. Discussed the risks of the procedure including risk of bleeding, infection, injury to surrounding structures, further procedures, and she is willing to proceed. She will check at work to make sure that she can have the time off but in the meantime we'll tentatively schedule the patient for 7/30.  Face-to-face time spent with the patient and care providers was 40 minutes, with more than 50% of the time spent counseling, educating, and coordinating care of the patient.     Howie Ill, MD [facility]

## 2017-04-24 ENCOUNTER — Telehealth: Payer: Self-pay | Admitting: Surgery

## 2017-04-24 NOTE — Telephone Encounter (Signed)
Pt advised of pre op date/time and sx date. Sx: 05/07/17 with Dr Piscoya--Laparoscopic incisional hernia repair with mesh.  Pre op: 04/30/17 @ 8:00am--office.   Patient made aware to call [phone], between 1-3:00pm the day before surgery, to find out what time to arrive.

## 2017-04-30 ENCOUNTER — Inpatient Hospital Stay: Admission: RE | Admit: 2017-04-30 | Payer: Medicaid Other | Source: Ambulatory Visit

## 2017-05-02 ENCOUNTER — Encounter
Admission: RE | Admit: 2017-05-02 | Discharge: 2017-05-02 | Disposition: A | Discharge status: Home / Self Care | Payer: Medicaid Other | Source: Ambulatory Visit | Attending: Surgery | Admitting: Surgery

## 2017-05-02 ENCOUNTER — Encounter: Payer: Self-pay | Admitting: *Deleted

## 2017-05-02 HISTORY — DX: Calculus of gallbladder without cholecystitis without obstruction: K80.20

## 2017-05-02 HISTORY — DX: Anemia, unspecified: D64.9

## 2017-05-02 NOTE — Patient Instructions (Signed)
  Your procedure is scheduled on: 05-07-17 MONDAY Report to Same Day Surgery 2nd floor medical mall [facility](Medical Mall Entrance-take elevator on left to 2nd floor.  Check in with surgery information desk.) To find out your arrival time please call [phone] between 1PM - 3PM on 05-04-17 FRIDAY  Remember: Instructions that are not followed completely may result in serious medical risk, up to and including death, or upon the discretion of your surgeon and anesthesiologist your surgery may need to be rescheduled.    _x___ 1. Do not eat food or drink liquids after midnight. No gum chewing or hard candies.     __x__ 2. No Alcohol for 24 hours before or after surgery.   __x__3. No Smoking for 24 prior to surgery.   ____  4. Bring all medications with you on the day of surgery if instructed.    __x__ 5. Notify your doctor if there is any change in your medical condition     (cold, fever, infections).     Do not wear jewelry, make-up, hairpins, clips or nail polish.  Do not wear lotions, powders, or perfumes. You may wear deodorant.  Do not shave 48 hours prior to surgery. Men may shave face and neck.  Do not bring valuables to the hospital.    [facility] is not responsible for any belongings or valuables.               Contacts, dentures or bridgework may not be worn into surgery.  Leave your suitcase in the car. After surgery it may be brought to your room.  For patients admitted to the hospital, discharge time is determined by your treatment team.   Patients discharged the day of surgery will not be allowed to drive home.  You will need someone to drive you home and stay with you the night of your procedure.    Please read over the following fact sheets that you were given:     ____ Take anti-hypertensive (unless it includes a diuretic), cardiac, seizure, asthma,     anti-reflux and psychiatric medicines. These include:  1. NONE  2.  3.  4.  5.  6.  ____Fleets enema or Magnesium  Citrate as directed.   _x___ Use CHG Soap or sage wipes as directed on instruction sheet   ____ Use inhalers on the day of surgery and bring to hospital day of surgery  ____ Stop Metformin and Janumet 2 days prior to surgery.    ____ Take 1/2 of usual insulin dose the night before surgery and none on the morning  surgery.   ____ Follow recommendations from Cardiologist, Pulmonologist or PCP regarding stopping Aspirin, Coumadin, Pllavix ,Eliquis, Effient, or Pradaxa, and Pletal.  X____Stop Anti-inflammatories such as Advil, Aleve, Ibuprofen, Motrin, Naproxen, Naprosyn, Goodies powders or aspirin products NOW-OK to take Tylenol OR PERCOCET   ____ Stop supplements until after surgery.    ____ Bring C-Pap to the hospital.

## 2017-05-03 ENCOUNTER — Encounter
Admission: RE | Admit: 2017-05-03 | Discharge: 2017-05-03 | Disposition: A | Discharge status: Home / Self Care | Payer: Medicaid Other | Source: Ambulatory Visit | Attending: Surgery | Admitting: Surgery

## 2017-05-03 DIAGNOSIS — K469 Unspecified abdominal hernia without obstruction or gangrene: Secondary | ICD-10-CM | POA: Insufficient documentation

## 2017-05-03 DIAGNOSIS — Z833 Family history of diabetes mellitus: Secondary | ICD-10-CM | POA: Insufficient documentation

## 2017-05-03 DIAGNOSIS — Z01812 Encounter for preprocedural laboratory examination: Secondary | ICD-10-CM | POA: Diagnosis present

## 2017-05-03 DIAGNOSIS — Z9889 Other specified postprocedural states: Secondary | ICD-10-CM | POA: Insufficient documentation

## 2017-05-03 DIAGNOSIS — Z825 Family history of asthma and other chronic lower respiratory diseases: Secondary | ICD-10-CM | POA: Insufficient documentation

## 2017-05-03 DIAGNOSIS — Z79899 Other long term (current) drug therapy: Secondary | ICD-10-CM | POA: Diagnosis not present

## 2017-05-03 DIAGNOSIS — R109 Unspecified abdominal pain: Secondary | ICD-10-CM | POA: Diagnosis not present

## 2017-05-03 DIAGNOSIS — K8020 Calculus of gallbladder without cholecystitis without obstruction: Secondary | ICD-10-CM | POA: Diagnosis not present

## 2017-05-03 DIAGNOSIS — Z8249 Family history of ischemic heart disease and other diseases of the circulatory system: Secondary | ICD-10-CM | POA: Insufficient documentation

## 2017-05-03 LAB — POTASSIUM: POTASSIUM: 3.8 mmol/L (ref 3.5–5.1)

## 2017-05-06 MED ORDER — CEFAZOLIN SODIUM-DEXTROSE 2-4 GM/100ML-% IV SOLN
2.0000 g | INTRAVENOUS | Status: AC
  Administered 2017-05-07: 2 g via INTRAVENOUS

## 2017-05-07 ENCOUNTER — Ambulatory Visit: Payer: Medicaid Other | Admitting: Anesthesiology

## 2017-05-07 ENCOUNTER — Encounter: Payer: Self-pay | Admitting: *Deleted

## 2017-05-07 ENCOUNTER — Encounter
Admission: RE | Disposition: A | Discharge status: Home / Self Care | Payer: Self-pay | Source: Ambulatory Visit | Attending: Surgery

## 2017-05-07 ENCOUNTER — Observation Stay
Admission: RE | Admit: 2017-05-07 | Discharge: 2017-05-08 | Disposition: A | Discharge status: Home / Self Care | Payer: Medicaid Other | Source: Ambulatory Visit | Attending: Surgery | Admitting: Surgery

## 2017-05-07 DIAGNOSIS — F419 Anxiety disorder, unspecified: Secondary | ICD-10-CM | POA: Diagnosis not present

## 2017-05-07 DIAGNOSIS — F329 Major depressive disorder, single episode, unspecified: Secondary | ICD-10-CM | POA: Diagnosis not present

## 2017-05-07 DIAGNOSIS — K429 Umbilical hernia without obstruction or gangrene: Secondary | ICD-10-CM | POA: Diagnosis not present

## 2017-05-07 DIAGNOSIS — K660 Peritoneal adhesions (postprocedural) (postinfection): Secondary | ICD-10-CM | POA: Insufficient documentation

## 2017-05-07 DIAGNOSIS — E669 Obesity, unspecified: Secondary | ICD-10-CM | POA: Diagnosis not present

## 2017-05-07 DIAGNOSIS — Z6841 Body Mass Index (BMI) 40.0 and over, adult: Secondary | ICD-10-CM | POA: Diagnosis not present

## 2017-05-07 DIAGNOSIS — K432 Incisional hernia without obstruction or gangrene: Secondary | ICD-10-CM | POA: Diagnosis not present

## 2017-05-07 DIAGNOSIS — Z87891 Personal history of nicotine dependence: Secondary | ICD-10-CM | POA: Diagnosis not present

## 2017-05-07 DIAGNOSIS — Z79899 Other long term (current) drug therapy: Secondary | ICD-10-CM | POA: Insufficient documentation

## 2017-05-07 HISTORY — PX: INCISIONAL HERNIA REPAIR: SHX193

## 2017-05-07 LAB — POCT PREGNANCY, URINE: Preg Test, Ur: NEGATIVE

## 2017-05-07 SURGERY — REPAIR, HERNIA, INCISIONAL, LAPAROSCOPIC
Anesthesia: General | Wound class: Clean

## 2017-05-07 MED ORDER — SUCCINYLCHOLINE CHLORIDE 20 MG/ML IJ SOLN
INTRAMUSCULAR | Status: DC | PRN
  Administered 2017-05-07: 120 mg via INTRAVENOUS

## 2017-05-07 MED ORDER — LACTATED RINGERS IV SOLN
125.0000 mL/h | INTRAVENOUS | Status: DC
  Administered 2017-05-07 – 2017-05-08 (×2): 125 mL/h via INTRAVENOUS

## 2017-05-07 MED ORDER — ROCURONIUM BROMIDE 100 MG/10ML IV SOLN
INTRAVENOUS | Status: DC | PRN
  Administered 2017-05-07: 20 mg via INTRAVENOUS
  Administered 2017-05-07: 45 mg via INTRAVENOUS
  Administered 2017-05-07: 30 mg via INTRAVENOUS
  Administered 2017-05-07: 5 mg via INTRAVENOUS
  Administered 2017-05-07: 20 mg via INTRAVENOUS

## 2017-05-07 MED ORDER — ONDANSETRON 4 MG PO TBDP: 4.0000 mg | ORAL_TABLET | Freq: Four times a day (QID) | ORAL | Status: DC | PRN

## 2017-05-07 MED ORDER — EPINEPHRINE PF 1 MG/ML IJ SOLN
INTRAMUSCULAR | Status: AC
  Filled 2017-05-07: qty 1

## 2017-05-07 MED ORDER — GABAPENTIN 300 MG PO CAPS
300.0000 mg | ORAL_CAPSULE | ORAL | Status: AC
  Administered 2017-05-07: 300 mg via ORAL

## 2017-05-07 MED ORDER — LIDOCAINE HCL (CARDIAC) 20 MG/ML IV SOLN
INTRAVENOUS | Status: DC | PRN
  Administered 2017-05-07: 100 mg via INTRAVENOUS

## 2017-05-07 MED ORDER — FENTANYL CITRATE (PF) 100 MCG/2ML IJ SOLN
INTRAMUSCULAR | Status: DC | PRN
Start: 2017-05-07 — End: 2017-05-07
  Administered 2017-05-07 (×2): 50 ug via INTRAVENOUS
  Administered 2017-05-07: 100 ug via INTRAVENOUS
  Administered 2017-05-07 (×2): 50 ug via INTRAVENOUS

## 2017-05-07 MED ORDER — PANTOPRAZOLE SODIUM 40 MG IV SOLR
40.0000 mg | Freq: Every day | INTRAVENOUS | Status: DC
  Administered 2017-05-07: 40 mg via INTRAVENOUS
  Filled 2017-05-07: qty 40

## 2017-05-07 MED ORDER — ACETAMINOPHEN 500 MG PO TABS
1000.0000 mg | ORAL_TABLET | ORAL | Status: AC
  Administered 2017-05-07: 1000 mg via ORAL

## 2017-05-07 MED ORDER — CHLORHEXIDINE GLUCONATE CLOTH 2 % EX PADS: 6.0000 | MEDICATED_PAD | Freq: Once | CUTANEOUS | Status: DC

## 2017-05-07 MED ORDER — DEXAMETHASONE SODIUM PHOSPHATE 10 MG/ML IJ SOLN
INTRAMUSCULAR | Status: DC | PRN
  Administered 2017-05-07: 10 mg via INTRAVENOUS

## 2017-05-07 MED ORDER — FAMOTIDINE 20 MG PO TABS
20.0000 mg | ORAL_TABLET | Freq: Once | ORAL | Status: AC
  Administered 2017-05-07: 20 mg via ORAL

## 2017-05-07 MED ORDER — PROPOFOL 10 MG/ML IV BOLUS
INTRAVENOUS | Status: DC | PRN
  Administered 2017-05-07: 200 mg via INTRAVENOUS

## 2017-05-07 MED ORDER — ACETAMINOPHEN 10 MG/ML IV SOLN
INTRAVENOUS | Status: DC | PRN
  Administered 2017-05-07: 1000 mg via INTRAVENOUS

## 2017-05-07 MED ORDER — MIDAZOLAM HCL 2 MG/2ML IJ SOLN
INTRAMUSCULAR | Status: DC | PRN
  Administered 2017-05-07: 2 mg via INTRAVENOUS

## 2017-05-07 MED ORDER — ENOXAPARIN SODIUM 40 MG/0.4ML ~~LOC~~ SOLN: 40.0000 mg | SUBCUTANEOUS | Status: DC

## 2017-05-07 MED ORDER — HYDROMORPHONE HCL 1 MG/ML IJ SOLN: 0.5000 mg | INTRAMUSCULAR | Status: DC | PRN

## 2017-05-07 MED ORDER — KETOROLAC TROMETHAMINE 30 MG/ML IJ SOLN
30.0000 mg | Freq: Four times a day (QID) | INTRAMUSCULAR | Status: DC
  Administered 2017-05-07 – 2017-05-08 (×4): 30 mg via INTRAVENOUS
  Filled 2017-05-07 (×4): qty 1

## 2017-05-07 MED ORDER — ONDANSETRON HCL 4 MG/2ML IJ SOLN: 4.0000 mg | Freq: Once | INTRAMUSCULAR | Status: DC | PRN

## 2017-05-07 MED ORDER — BUPIVACAINE-EPINEPHRINE 0.25% -1:200000 IJ SOLN
INTRAMUSCULAR | Status: DC | PRN
  Administered 2017-05-07: 30 mL

## 2017-05-07 MED ORDER — BUPIVACAINE HCL (PF) 0.25 % IJ SOLN
INTRAMUSCULAR | Status: AC
  Filled 2017-05-07: qty 30

## 2017-05-07 MED ORDER — ONDANSETRON HCL 4 MG/2ML IJ SOLN
INTRAMUSCULAR | Status: DC | PRN
  Administered 2017-05-07: 4 mg via INTRAVENOUS

## 2017-05-07 MED ORDER — ACETAMINOPHEN 500 MG PO TABS
1000.0000 mg | ORAL_TABLET | Freq: Four times a day (QID) | ORAL | Status: DC
  Administered 2017-05-07 – 2017-05-08 (×3): 1000 mg via ORAL
  Filled 2017-05-07 (×4): qty 2

## 2017-05-07 MED ORDER — FENTANYL CITRATE (PF) 100 MCG/2ML IJ SOLN
INTRAMUSCULAR | Status: AC
  Administered 2017-05-07: 25 ug via INTRAVENOUS
  Filled 2017-05-07: qty 2

## 2017-05-07 MED ORDER — ONDANSETRON HCL 4 MG/2ML IJ SOLN: 4.0000 mg | Freq: Four times a day (QID) | INTRAMUSCULAR | Status: DC | PRN

## 2017-05-07 MED ORDER — LACTATED RINGERS IV SOLN
INTRAVENOUS | Status: DC
  Administered 2017-05-07 (×3): via INTRAVENOUS

## 2017-05-07 MED ORDER — LABETALOL HCL 5 MG/ML IV SOLN
INTRAVENOUS | Status: DC | PRN
  Administered 2017-05-07 (×2): 10 mg via INTRAVENOUS

## 2017-05-07 MED ORDER — POLYETHYLENE GLYCOL 3350 17 G PO PACK: 17.0000 g | PACK | Freq: Every day | ORAL | Status: DC | PRN

## 2017-05-07 MED ORDER — METOPROLOL TARTRATE 5 MG/5ML IV SOLN
INTRAVENOUS | Status: DC | PRN
  Administered 2017-05-07: 2 mg via INTRAVENOUS
  Administered 2017-05-07: 3 mg via INTRAVENOUS

## 2017-05-07 MED ORDER — OXYCODONE HCL 5 MG PO TABS
5.0000 mg | ORAL_TABLET | ORAL | Status: DC | PRN
Start: 2017-05-07 — End: 2017-05-08
  Administered 2017-05-07: 5 mg via ORAL
  Administered 2017-05-07 – 2017-05-08 (×3): 10 mg via ORAL
  Filled 2017-05-07: qty 2
  Filled 2017-05-07: qty 1
  Filled 2017-05-07 (×3): qty 2

## 2017-05-07 MED ORDER — SUGAMMADEX SODIUM 500 MG/5ML IV SOLN
INTRAVENOUS | Status: DC | PRN
  Administered 2017-05-07: 220 mg via INTRAVENOUS

## 2017-05-07 MED ORDER — FENTANYL CITRATE (PF) 100 MCG/2ML IJ SOLN
25.0000 ug | INTRAMUSCULAR | Status: AC | PRN
  Administered 2017-05-07 (×6): 25 ug via INTRAVENOUS

## 2017-05-07 SURGICAL SUPPLY — 40 items
BLADE SURG SZ11 CARB STEEL (BLADE) ×3 IMPLANT
CANISTER SUCT 1200ML W/VALVE (MISCELLANEOUS) ×3 IMPLANT
CHLORAPREP W/TINT 26ML (MISCELLANEOUS) ×3 IMPLANT
DERMABOND ADVANCED (GAUZE/BANDAGES/DRESSINGS) ×2
DERMABOND ADVANCED .7 DNX12 (GAUZE/BANDAGES/DRESSINGS) ×1 IMPLANT
ELECT REM PT RETURN 9FT ADLT (ELECTROSURGICAL) ×3
ELECTRODE REM PT RTRN 9FT ADLT (ELECTROSURGICAL) ×1 IMPLANT
GLOVE SURG SYN 7.0 (GLOVE) ×3 IMPLANT
GLOVE SURG SYN 7.5  E (GLOVE) ×2
GLOVE SURG SYN 7.5 E (GLOVE) ×1 IMPLANT
GOWN STRL REUS W/ TWL LRG LVL3 (GOWN DISPOSABLE) ×2 IMPLANT
GOWN STRL REUS W/TWL LRG LVL3 (GOWN DISPOSABLE) ×4
IRRIGATION STRYKERFLOW (MISCELLANEOUS) ×1 IMPLANT
IRRIGATOR STRYKERFLOW (MISCELLANEOUS) ×3
IV NS 1000ML (IV SOLUTION) ×2
IV NS 1000ML BAXH (IV SOLUTION) ×1 IMPLANT
KIT RM TURNOVER STRD PROC AR (KITS) ×3 IMPLANT
L-HOOK LAP DISP 36CM (ELECTROSURGICAL) ×3
LABEL OR SOLS (LABEL) ×3 IMPLANT
LHOOK LAP DISP 36CM (ELECTROSURGICAL) ×1 IMPLANT
LIGASURE LAP MARYLAND 5MM 37CM (ELECTROSURGICAL) IMPLANT
MESH VENT LT ST 17.8X22.9CM EL (Mesh General) ×3 IMPLANT
NEEDLE HYPO 22GX1.5 SAFETY (NEEDLE) ×3 IMPLANT
NEEDLE INSUFFLATION 150MM (ENDOMECHANICALS) ×3 IMPLANT
PACK LAP CHOLECYSTECTOMY (MISCELLANEOUS) ×3 IMPLANT
PENCIL ELECTRO HAND CTR (MISCELLANEOUS) ×3 IMPLANT
SCISSORS METZENBAUM CVD 33 (INSTRUMENTS) ×3 IMPLANT
SLEEVE ADV FIXATION 5X100MM (TROCAR) ×6 IMPLANT
SUT MNCRL 4-0 (SUTURE) ×2
SUT MNCRL 4-0 27XMFL (SUTURE) ×1
SUT PROLENE 2 0 FS (SUTURE) ×15 IMPLANT
SUT VIC AB 3-0 SH 27 (SUTURE) ×2
SUT VIC AB 3-0 SH 27X BRD (SUTURE) ×1 IMPLANT
SUT VICRYL 0 TIES 12 18 (SUTURE) ×3 IMPLANT
SUTURE MNCRL 4-0 27XMF (SUTURE) ×1 IMPLANT
TACKER 5MM HERNIA 3.5CML NAB (ENDOMECHANICALS) ×6 IMPLANT
TRAY FOLEY W/METER SILVER 16FR (SET/KITS/TRAYS/PACK) ×3 IMPLANT
TROCAR 130MM GELPORT  DAV (MISCELLANEOUS) ×6 IMPLANT
TROCAR Z-THREAD OPTICAL 5X100M (TROCAR) ×3 IMPLANT
TUBING INSUFFLATOR HI FLOW (MISCELLANEOUS) ×3 IMPLANT

## 2017-05-07 NOTE — Transfer of Care (Signed)
Immediate Anesthesia Transfer of Care Note  Patient: [name]  Procedure(s) Performed: Procedure(s): LAPAROSCOPIC INCISIONAL HERNIA (N/A)  Patient Location: PACU  Anesthesia Type:General  Level of Consciousness: drowsy and patient cooperative  Airway & Oxygen Therapy: Patient Spontanous Breathing and Patient connected to face mask oxygen  Post-op Assessment: Report given to RN and Post -op Vital signs reviewed and stable  Post vital signs: Reviewed and stable  Last Vitals:  Vitals:   05/07/17 0850 05/07/17 1437  BP: 125/82 (!) 141/97  Pulse: 78 86  Resp: 16 (!) 23  Temp: 36.7 C 36.7 C    Last Pain:  Vitals:   05/07/17 0850  TempSrc: Oral         Complications: No apparent anesthesia complications

## 2017-05-07 NOTE — Interval H&P Note (Signed)
History and Physical Interval Note:  05/07/2017 9:50 AM  [name]  has presented today for surgery, with the diagnosis of umbilical hernia and right lower quadrant incisional hernia  The various methods of treatment have been discussed with the patient and family. After consideration of risks, benefits and other options for treatment, the patient has consented to  Procedure(s): LAPAROSCOPIC INCISIONAL HERNIA (N/A) as a surgical intervention .  The patient's history has been reviewed, patient examined, no change in status, stable for surgery.  I have reviewed the patient's chart and labs.  Questions were answered to the patient's satisfaction.     Karena Kinker

## 2017-05-07 NOTE — H&P (View-Only) (Signed)
04/23/2017  History of Present Illness: [name] is a 31 y.o. female who presents for follow up on abdominal pain.  She had presented to the ED on 6/27 with abdominal pain in the right upper quadrant and had an ultrasound was positive for cholelithiasis without evidence of cholecystitis. She also reports that she had been having episodes of pain in her lower abdomen and on CT scan was noted to have a ventral hernia likely from prior C-section as well as a fat containing umbilical hernia.    She reports however that since her visit to the emergency room. She has not had any other pain in the right upper quadrant and was a one-time only episode. However the pain and discomfort in the lower portion of her abdomen still persisting. Every day when she coughs or sneezes she notices bulging in the umbilical region consistent with her hernia as well as discomfort in the right lower quadrant. Reports some nausea but no vomiting or diarrhea related to food intake but has had some pain issues and is taking her oxycodone given the emergency room once daily. She feels that she needs to present her abdominal wall in order to help with the pain.  Past Medical History: Past Medical History:  Diagnosis Date  . Abdominal pain affecting pregnancy, antepartum 02/28/2016  . Anxiety   . Depression   . Incisional ventral hernia w obstruction   . Obesity 09/17/2013   Last Assessment & Plan:  Obesity in pregnancy is associated with maternal and obstetric complications including increased risk of gestational DM, gestational HTN, preeclampsia, thromboembolism, failed induction of labor, Cesarean section, excessive gestational weight gain and post-partum weight retention. Obesity is also associated with fetal complications including prematurity, stillbirth, congen  . Post-operative state 06/13/2016  . SIDS (sudden infant death syndrome) 10/03/2013   Last Assessment & Plan:  [name]'s third son died at three months of  age from SIDS. I reviewed with [name] that the cause of SIDS is unknown. I did review with her that placing infants to sleep on their backs without soft bedding and tobacco cessation are potentially modifiable risk factors to decrease her risk of SIDS.  Marland Kitchen. Small bowel obstruction (HCC)   . Supervision of high risk pregnancy in third trimester 09/17/2013   Last Assessment & Plan:  [name] [facility] She reports having an appointment tomorrow 12/3. I encouraged her to get a flu and TDAP vaccination. She is due for her CBC, RPR, third trimester HIV and glucola.   . Tobacco abuse 09/28/2013   Last Assessment & Plan:  I discussed with [name] that the risks of smoking in pregnancy include spontaneous pregnancy loss, placental abruption, preterm premature rupture of membranes (PPROM), placenta previa, preterm labor and delivery, and low birth weight (LBW) as well as SIDS. I discussed with her that her tobacco use is a modifiable risk factor for risk of placental abruption and SIDS, b     Past Surgical History: Past Surgical History:  Procedure Laterality Date  . CESAREAN SECTION N/A 06/12/2016   Procedure: STAT CESAREAN SECTION WITH BILATERAL STERILIZATION;  Surgeon: Suzy Bouchardhomas J Schermerhorn, MD;  Location: [facility] ORS;  Service: Obstetrics;  Laterality: N/A;  Baby Girl @ 2246 Weight 5 lb 10 oz Apgars 8/9  . TUBAL LIGATION    . VENTRAL HERNIA REPAIR N/A 06/19/2016   Procedure: HERNIA REPAIR VENTRAL ADULT;  Surgeon: Lattie Hawichard E Cooper, MD;  Location: [facility] ORS;  Service: General;  Laterality: N/A;    Home Medications: Prior to Admission medications   Medication Sig Start Date End Date Taking? Authorizing Provider  docusate sodium (COLACE) 100 MG capsule Take 1 capsule (100 mg total) by mouth daily as needed for mild constipation. 06/15/16  Yes [name], Bethany, MD  ferrous sulfate 325 (65 FE) MG tablet Take 325 mg by mouth.   Yes [provider]  ibuprofen  (ADVIL,MOTRIN) 800 MG tablet Take 800 mg by mouth every 8 (eight) hours as needed.   Yes [provider]  ondansetron (ZOFRAN ODT) 4 MG disintegrating tablet Take 1 tablet (4 mg total) by mouth every 8 (eight) hours as needed for nausea or vomiting. 04/04/17  Yes Darci CurrentBrown, Widener N, MD  oxyCODONE-acetaminophen (ROXICET) 5-325 MG tablet Take 1 tablet by mouth every 4 (four) hours as needed for severe pain. 04/04/17  Yes Darci CurrentBrown, Bellamy N, MD  ferrous sulfate 325 (65 FE) MG tablet Take 1 tablet (325 mg total) by mouth 2 (two) times daily with a meal. For anemia, take with Vitamin C 06/15/16 08/14/16  [name], Bethany, MD  furosemide (LASIX) 20 MG tablet Take 1 tablet (20 mg total) by mouth daily. 07/14/16 07/20/16  Jennye MoccasinQuigley, Brian S, MD  potassium chloride 20 MEQ TBCR Take 20 mEq by mouth daily. 07/14/16 07/22/16  Jennye MoccasinQuigley, Brian S, MD    Allergies: No Known Allergies  Social History:  reports that she has quit smoking. She has quit using smokeless tobacco. She reports that she does not drink alcohol or use drugs.   Family History: Family History  Problem Relation Age of Onset  . Diabetes Mother   . Asthma Sister   . Hypertension Maternal Aunt   . Hypertension Maternal Uncle     Review of Systems: Review of Systems  Constitutional: Negative for chills and fever.  HENT: Negative for hearing loss.   Respiratory: Negative for shortness of breath.   Cardiovascular: Negative for chest pain.  Gastrointestinal: Positive for abdominal pain and nausea. Negative for constipation, diarrhea and vomiting.  Genitourinary: Negative for dysuria.  Musculoskeletal: Negative for myalgias.  Skin: Negative for rash.  Neurological: Negative for dizziness.  Psychiatric/Behavioral: Negative for depression.  All other systems reviewed and are negative.   Physical Exam BP 127/82   Pulse 85   Temp 98.1 F (36.7 C) (Oral)   Ht 5\' 4"  (1.626 m)   Wt 108.1 kg (238 lb 6.4 oz)   LMP 04/03/2017   BMI 40.92  kg/m   CONSTITUTIONAL: No acute distress HEENT:  Normocephalic, atraumatic, extraocular motion intact. NECK: Trachea is midline, and there is no jugular venous distension.  RESPIRATORY:  Lungs are clear, and breath sounds are equal bilaterally. Normal respiratory effort without pathologic use of accessory muscles. CARDIOVASCULAR: Heart is regular without murmurs, gallops, or rubs. GI: The abdomen is soft, obese, nondistended, with mild discomfort to palpation in the lower abdomen as well as periumbilical region. She has approximate 3 cm defect palpable periumbilical region with either fat or bowel protruding although is very reducible. Her lower abdominal hernia is not as palpable given her body habitus but its evident on CT scan.  There is no tenderness in the epigastric region or in the right upper quadrant.  MUSCULOSKELETAL:  Normal muscle strength and tone in all four extremities.  No peripheral edema or cyanosis. SKIN: Skin turgor is normal. There are no pathologic skin lesions.  NEUROLOGIC:  Motor and sensation is grossly normal.  Cranial nerves are grossly intact. PSYCH:  Alert and oriented  to person, place and time. Affect is normal.  Labs/Imaging: CT scan from 6/27 showing an infraumbilical midline ventral hernia containing nonobstructed small bowel as well as a fat-containing umbilical hernia. Also shows cholelithiasis.  Assessment and Plan: This is a 31 y.o. female who presents with 2 hernias noted on CT scan after a visit to the emergency room for abdominal pain. I have viewed the patient's imaging studies independently and review her laboratories from her emergency room visit as well.  Patient has 2 hernias on exam and a CT scan. Discussed with the patient that even that she's had 2 abdominal surgeries already from which she develop hernias, we would try to approach this via laparoscopy instead of an open procedure. The hernias are close enough on the inside we may be able to fix  everything went well with one large piece of mesh but otherwise we would do 2 smaller pieces. Currently the patient is not having any right upper quadrant pain express the patient that at this point is no specific need to do any cholecystectomy procedures. Thus we will proceed only with ventral hernia repair with mesh. Discussed the risks of the procedure including risk of bleeding, infection, injury to surrounding structures, further procedures, and she is willing to proceed. She will check at work to make sure that she can have the time off but in the meantime we'll tentatively schedule the patient for 7/30.  Face-to-face time spent with the patient and care providers was 40 minutes, with more than 50% of the time spent counseling, educating, and coordinating care of the patient.     Howie Ill, MD [facility]

## 2017-05-07 NOTE — Anesthesia Procedure Notes (Signed)
Procedure Name: Intubation Date/Time: 05/07/2017 11:01 AM Performed by: Silvana Newness Pre-anesthesia Checklist: Patient identified, Emergency Drugs available, Suction available, Patient being monitored and Timeout performed Patient Re-evaluated:Patient Re-evaluated prior to induction Oxygen Delivery Method: Circle system utilized Preoxygenation: Pre-oxygenation with 100% oxygen Induction Type: IV induction Ventilation: Mask ventilation without difficulty Laryngoscope Size: Mac and 3 Grade View: Grade I Tube type: Oral Tube size: 7.0 mm Number of attempts: 1 Airway Equipment and Method: Stylet Placement Confirmation: ETT inserted through vocal cords under direct vision,  positive ETCO2 and breath sounds checked- equal and bilateral Secured at: 21 cm Tube secured with: Tape Dental Injury: Teeth and Oropharynx as per pre-operative assessment

## 2017-05-07 NOTE — Anesthesia Preprocedure Evaluation (Signed)
Anesthesia Evaluation  Patient identified by MRN, date of birth, ID band Patient awake    Reviewed: Allergy & Precautions, H&P , NPO status , Patient's Chart, lab work & pertinent test results  History of Anesthesia Complications Negative for: history of anesthetic complications  Airway Mallampati: III  TM Distance: >3 FB Neck ROM: full    Dental no notable dental hx. (+) Teeth Intact   Pulmonary neg sleep apnea, neg COPD, Current Smoker,    breath sounds clear to auscultation- rhonchi (-) wheezing      Cardiovascular Exercise Tolerance: Good (-) hypertension(-) CAD and (-) Past MI negative cardio ROS   Rhythm:Regular Rate:Normal - Systolic murmurs and - Diastolic murmurs    Neuro/Psych PSYCHIATRIC DISORDERS Anxiety Depression negative neurological ROS     GI/Hepatic negative GI ROS, Neg liver ROS,   Endo/Other  neg diabetesMorbid obesity  Renal/GU negative Renal ROS  negative genitourinary   Musculoskeletal   Abdominal (+) + obese,   Peds negative pediatric ROS (+)  Hematology negative hematology ROS (+) anemia ,   Anesthesia Other Findings Past Medical History: No date: Anxiety No date: Depression   Reproductive/Obstetrics                             Anesthesia Physical  Anesthesia Plan  ASA: III  Anesthesia Plan: General   Post-op Pain Management:    Induction: Intravenous, Rapid sequence and Cricoid pressure planned  PONV Risk Score and Plan:   Airway Management Planned: Oral ETT  Additional Equipment:   Intra-op Plan:   Post-operative Plan: Extubation in OR  Informed Consent: I have reviewed the patients History and Physical, chart, labs and discussed the procedure including the risks, benefits and alternatives for the proposed anesthesia with the patient or authorized representative who has indicated his/her understanding and acceptance.   Dental Advisory  Given  Plan Discussed with: Anesthesiologist, CRNA and Surgeon  Anesthesia Plan Comments:         Anesthesia Quick Evaluation

## 2017-05-07 NOTE — Op Note (Signed)
  Procedure Date:  05/07/2017  Pre-operative Diagnosis: Umbilical hernia and right lower quadrant incisional hernia  Post-operative Diagnosis: Incisional hernia x 2  Procedure:  Laparoscopic Incisional Hernia Repair with mesh, extensive lysis of adhesions of more than 90 minutes.  Surgeon:  Howie IllJose Luis Belvia Gotschall, MD  Anesthesia:  General endotracheal  Estimated Blood Loss:  30 ml  Specimens:  None  Complications:  None  Indications for Procedure:  This is a 31 y.o. female who presents with an umbilical and low abdominal incisional hernia.  The options of surgery versus observation were reviewed with the patient and/or family. The risks of bleeding, abscess or infection, recurrence of symptoms, potential for an open procedure, injury to surrounding structures, and chronic pain were all discussed with the patient and was willing to proceed.  Description of Procedure: The patient was correctly identified in the preoperative area and brought into the operating room.  The patient was placed supine with VTE prophylaxis in place.  Appropriate time-outs were performed.  Anesthesia was induced and the patient was intubated.  Appropriate antibiotics were infused.  Foley catheter was placed.  The abdomen was prepped and draped in a sterile fashion.  A Veress needle was introduced in the left upper quadrant and pneumoperitoneum was obtained with appropriate pressures.  2 5-mm and one 12-mm port were placed along the left lateral side without complications, and then two 5 mm ports were placed along the right lateral side.  On inspection of the abdomen, the patient was noted to have two separate hernias at the prior midline incision point.  One hernia was at the superior pole, and one was over the lower two thirds.  The patient had small bowel significantly adhered to the hernias.  At that point, extensive lysis of adhesions was performed with sharp and blunt dissection, with careful dissection to avoid bowel  injury.  Once the bowel was fully freed, thorough inspection revealed no bowel injury.   The hernias were measured to be approximately 6 inches length combined by 3 inches width.  An appropriate sized Bard Ventralight ST mesh with Echo PS system was inserted via the 12 mm port.  A small stab incision was made in the mid portion of the hernia and using a laparoscopic suture passer, the balloon inflating system was passed through the abdominal wall.   The positioning system was insufflated and the mesh was placed over the defect covering it entirely and tacked in place.  The mesh was further secured with 4 transfascial prolene sutures.  The 12-mm port was removed and the fascia was closed under direct visualization utilizing an Endo Close technique with 0 Vicryl interrupted suture.  The bowel was once again inspected and there was no bowel injury, and no evidence of any bowel contents in the abdomen. The 5 mm ports were removed.  Local anesthetic was infused in all incisions and the incisions were closed with 4-0 Monocryl.  The wounds were cleaned and sealed with DermaBond.  Foley catheter was removed and the patient was emerged from anesthesia and extubated and brought to the recovery room for further management.  The patient tolerated the procedure well and all counts were correct at the end of the case.   Howie IllJose Luis Efren Kross, MD

## 2017-05-07 NOTE — Anesthesia Post-op Follow-up Note (Cosign Needed)
Anesthesia QCDR form completed.        

## 2017-05-07 NOTE — Brief Op Note (Signed)
05/07/2017  2:35 PM  PATIENT:  [name]  31 y.o. female  PRE-OPERATIVE DIAGNOSIS:  umbilical hernia and right lower quadrant incisional hernia  POST-OPERATIVE DIAGNOSIS:  Incisional hernia x 2  PROCEDURE:  Procedure(s): LAPAROSCOPIC INCISIONAL HERNIA (N/A) repair  SURGEON:  Surgeon(s) and Role:    * Henrene DodgePiscoya, Anjelo Pullman, MD - Primary  ANESTHESIA:   general  EBL:  Total I/O In: 1300 [I.V.:1300] Out: 180 [Urine:150; Blood:30]  BLOOD ADMINISTERED:none  DRAINS: none   LOCAL MEDICATIONS USED:  BUPIVICAINE   SPECIMEN:  No Specimen  DISPOSITION OF SPECIMEN:  N/A  COUNTS:  YES  TOURNIQUET:  * No tourniquets in log *  DICTATION: .Dragon Dictation  PLAN OF CARE: Admit for overnight observation  PATIENT DISPOSITION:  PACU - hemodynamically stable.   Delay start of Pharmacological VTE agent (>24hrs) due to surgical blood loss or risk of bleeding: yes

## 2017-05-08 ENCOUNTER — Encounter: Payer: Self-pay | Admitting: Surgery

## 2017-05-08 DIAGNOSIS — K432 Incisional hernia without obstruction or gangrene: Secondary | ICD-10-CM | POA: Diagnosis not present

## 2017-05-08 LAB — CBC
HEMATOCRIT: 35.8 % (ref 35.0–47.0)
HEMOGLOBIN: 12 g/dL (ref 12.0–16.0)
MCH: 27.3 pg (ref 26.0–34.0)
MCHC: 33.6 g/dL (ref 32.0–36.0)
MCV: 81.2 fL (ref 80.0–100.0)
Platelets: 320 10*3/uL (ref 150–440)
RBC: 4.41 MIL/uL (ref 3.80–5.20)
RDW: 15.5 % — AB (ref 11.5–14.5)
WBC: 8.8 10*3/uL (ref 3.6–11.0)

## 2017-05-08 LAB — BASIC METABOLIC PANEL
Anion gap: 6 (ref 5–15)
BUN: 7 mg/dL (ref 6–20)
CHLORIDE: 111 mmol/L (ref 101–111)
CO2: 23 mmol/L (ref 22–32)
CREATININE: 0.54 mg/dL (ref 0.44–1.00)
Calcium: 9 mg/dL (ref 8.9–10.3)
Glucose, Bld: 115 mg/dL — ABNORMAL HIGH (ref 65–99)
POTASSIUM: 3.6 mmol/L (ref 3.5–5.1)
SODIUM: 140 mmol/L (ref 135–145)

## 2017-05-08 LAB — MAGNESIUM: MAGNESIUM: 1.5 mg/dL — AB (ref 1.7–2.4)

## 2017-05-08 MED ORDER — OXYCODONE-ACETAMINOPHEN 7.5-325 MG PO TABS: 1.0000 | ORAL_TABLET | ORAL | 0 refills | Status: DC | PRN

## 2017-05-08 MED ORDER — OXYCODONE-ACETAMINOPHEN 5-325 MG PO TABS
1.0000 | ORAL_TABLET | ORAL | Status: DC | PRN
  Administered 2017-05-08: 1 via ORAL
  Filled 2017-05-08: qty 1

## 2017-05-08 MED ORDER — POTASSIUM CHLORIDE CRYS ER 20 MEQ PO TBCR
40.0000 meq | EXTENDED_RELEASE_TABLET | Freq: Once | ORAL | Status: AC
  Administered 2017-05-08: 40 meq via ORAL
  Filled 2017-05-08: qty 2

## 2017-05-08 MED ORDER — MAGNESIUM SULFATE 2 GM/50ML IV SOLN
2.0000 g | Freq: Once | INTRAVENOUS | Status: AC
  Administered 2017-05-08: 2 g via INTRAVENOUS
  Filled 2017-05-08: qty 50

## 2017-05-08 MED ORDER — IBUPROFEN 600 MG PO TABS: 600.0000 mg | ORAL_TABLET | Freq: Three times a day (TID) | ORAL | 0 refills | Status: DC | PRN

## 2017-05-08 NOTE — Progress Notes (Signed)
Pt discharged per order. Discharge paperwork reviewed with pt. Prescription given. Work note given to pt. All questions answered to satisfaction. IV removed per order.

## 2017-05-08 NOTE — Discharge Summary (Signed)
Patient ID: [name] MRN: [id number] DOB/AGE: 31/12/1985 31 y.o.  Admit date: 05/07/2017 Discharge date: 05/08/2017   Discharge Diagnoses:  Active Problems:   Incisional hernia   Procedures:  Laparoscopic incisional hernia repair with mesh and extensive lysis of adhesions  Hospital Course:  Patient was taken to the OR on elective basis for a laparoscopic incisional hernia repair.  She also had extensive lysis of adhesions during the surgery.  She was admitted post-op for observation and pain control.  Her diet was slowly advanced and she was tolerating a regular diet.  Her pain was well controlled and she was ambulating and voiding without issues.  She was deemed ready for discharge.    Consults: None  Disposition: 01-Home or Self Care  Discharge Instructions    Call MD for:  difficulty breathing, headache or visual disturbances    Complete by:  As directed    Call MD for:  persistant nausea and vomiting    Complete by:  As directed    Call MD for:  redness, tenderness, or signs of infection (pain, swelling, redness, odor or green/yellow discharge around incision site)    Complete by:  As directed    Call MD for:  severe uncontrolled pain    Complete by:  As directed    Call MD for:  temperature >100.4    Complete by:  As directed    Diet - low sodium heart healthy    Complete by:  As directed    Discharge instructions    Complete by:  As directed    1.  Patient may shower but do not scrub wounds heavily and dab dry only. 2.  Do not submerge wounds in pool/tub 3.  Do not apply ointments or hydrogen peroxide to the wounds.   Driving Restrictions    Complete by:  As directed    Do not drive while taking narcotics for pain control.   Increase activity slowly    Complete by:  As directed    Lifting restrictions    Complete by:  As directed    No heavy lifting or pushing of more than 10-15 lbs for 4 weeks.     Allergies as of 05/08/2017   No Known Allergies      Medication List    STOP taking these medications   acetaminophen 325 MG tablet Commonly known as:  TYLENOL   furosemide 20 MG tablet Commonly known as:  LASIX   ondansetron 4 MG disintegrating tablet Commonly known as:  ZOFRAN ODT   oxyCODONE-acetaminophen 5-325 MG tablet Commonly known as:  ROXICET Replaced by:  oxyCODONE-acetaminophen 7.5-325 MG tablet   Potassium Chloride ER 20 MEQ Tbcr     TAKE these medications   ferrous sulfate 325 (65 FE) MG tablet Take 1 tablet (325 mg total) by mouth 2 (two) times daily with a meal. For anemia, take with Vitamin C   ibuprofen 600 MG tablet Commonly known as:  ADVIL,MOTRIN Take 1 tablet (600 mg total) by mouth every 8 (eight) hours as needed for fever or mild pain.   oxyCODONE-acetaminophen 7.5-325 MG tablet Commonly known as:  PERCOCET Take 1 tablet by mouth every 4 (four) hours as needed for severe pain. Replaces:  oxyCODONE-acetaminophen 5-325 MG tablet      Follow-up Information    Jodell Weitman, Elita QuickJose, MD Follow up in 2 week(s).   Specialty:  Surgery Contact information: [address] [place] [address] [phone]

## 2017-05-08 NOTE — Anesthesia Postprocedure Evaluation (Signed)
Anesthesia Post Note  Patient: [name]  Procedure(s) Performed: Procedure(s) (LRB): LAPAROSCOPIC INCISIONAL HERNIA (N/A)  Patient location during evaluation: PACU Anesthesia Type: General Level of consciousness: awake and alert Pain management: pain level controlled Vital Signs Assessment: post-procedure vital signs reviewed and stable Respiratory status: spontaneous breathing, nonlabored ventilation, respiratory function stable and patient connected to nasal cannula oxygen Cardiovascular status: blood pressure returned to baseline and stable Postop Assessment: no signs of nausea or vomiting Anesthetic complications: no     Last Vitals:  Vitals:   05/08/17 0016 05/08/17 0508  BP: 118/69 128/70  Pulse: 89 85  Resp: 20 20  Temp: 37.2 C 36.9 C    Last Pain:  Vitals:   05/08/17 0853  TempSrc:   PainSc: 6                  Yevette EdwardsJames G Strother Everitt

## 2017-05-08 NOTE — Progress Notes (Signed)
05/08/2017  Subjective: Patient is 1 Day Post-Op.  Status post laparoscopic incisional hernia repair with mesh. No acute events overnight. Patient tolerated clears well but reports still having abdominal pain at the tack sites and incisions.  Vital signs: Temp:  [97.8 F (36.6 C)-98.9 F (37.2 C)] 98.4 F (36.9 C) (07/31 0508) Pulse Rate:  [84-92] 85 (07/31 0508) Resp:  [19-29] 20 (07/31 0508) BP: (106-141)/(54-97) 128/70 (07/31 0508) SpO2:  [93 %-100 %] 96 % (07/31 0508) Weight:  [108 kg (238 lb)] 108 kg (238 lb) (07/30 1703)   Intake/Output: 07/30 0701 - 07/31 0700 In: 3335 [P.O.:1740; I.V.:1595] Out: 4330 [Urine:4300; Blood:30] Last BM Date: 05/05/17  Physical Exam: Constitutional: No acute distress Abdomen: Soft, nondistended, appropriately tender to palpation. No signs of peritonitis.  Labs:   Recent Labs  05/08/17 0450  WBC 8.8  HGB 12.0  HCT 35.8  PLT 320    Recent Labs  05/08/17 0450  NA 140  K 3.6  CL 111  CO2 23  GLUCOSE 115*  BUN 7  CREATININE 0.54  CALCIUM 9.0   No results for input(s): LABPROT, INR in the last 72 hours.  Imaging: No results found.  Assessment/Plan: 31 year old female status post laparoscopic incisional hernia repair with mesh.  -We'll advance the diet to regular diet today. Discontinue IV fluids when tolerating appropriate by mouth. -Continue pain medications. Depending how the patient is feeling she may be able to be discharged today if her pain is well-controlled. -Out of bed and ambulate.   Howie IllJose Luis Moyses Pavey, MD [facility]

## 2017-05-09 LAB — HIV ANTIBODY (ROUTINE TESTING W REFLEX): HIV Screen 4th Generation wRfx: NONREACTIVE

## 2017-05-10 ENCOUNTER — Telehealth: Payer: Self-pay

## 2017-05-10 NOTE — Telephone Encounter (Signed)
Post-op call made to patient at this time. Left message for patient stating that I was calling to see how she was doing and if she had any questions or concerns that we could address for her. Also reminded her of her Post Op appointment with Dr. Aleen CampiPiscoya at 1:30 on 8/20.

## 2017-05-10 NOTE — Telephone Encounter (Signed)
Post-op call made to patient at this time. Spoke with patient. Post-op interview questions below.  1. How are you feeling? Patient stated that she is not feeling well. She stated that she has RUQ and LUQ pain. I recommended for her to continue taking her Ibuprofen and Perocet if needed.   2. Is your pain controlled? Yes  3. What are you doing for the pain? Taking the pain medication and Ibuprofen.  4. Are you having any Nausea or Vomiting? Nauseated but no vomiting.  5. Are you having any Fever or Chills? No fever nor chills.  6. Are you having any Constipation or Diarrhea? No  7. Is there any Swelling or Bruising you are concerned about? No swelling but has abdominal bloating/gassy.  8. Do you have any questions or concerns at this time? Patient wants to know what she is able to do for her abdominal bloating/gassy. I recommended for her to walk and drink plenty of water and that this should help her with her abdominal bloating and pass gas.  I also told patient to give us a call in case she did not feel better by Tuesday.

## 2017-05-28 ENCOUNTER — Ambulatory Visit (INDEPENDENT_AMBULATORY_CARE_PROVIDER_SITE_OTHER): Payer: Medicaid Other | Admitting: Surgery

## 2017-05-28 ENCOUNTER — Encounter: Payer: Self-pay | Admitting: Surgery

## 2017-05-28 VITALS — BP 118/80 | HR 108 | Temp 98.3°F | Ht 63.0 in | Wt 277.2 lb

## 2017-05-28 DIAGNOSIS — Z09 Encounter for follow-up examination after completed treatment for conditions other than malignant neoplasm: Secondary | ICD-10-CM

## 2017-05-28 DIAGNOSIS — R109 Unspecified abdominal pain: Secondary | ICD-10-CM

## 2017-05-28 NOTE — Patient Instructions (Addendum)
We have scheduled a CT scan on 06/05/17 @ [facility].  Nothing by mouth 4 hours prior.We will call you with you the results.  We have seen you today in our office for your Ventral (Abdominal) Hernia. We recommend that you get an abdominal binder to help with comfort, you may obtain a binder at one of our local stores below:  [facility] [address] [place], [place] [phone]  [facility] [address] [place], [place] [phone]  [facility] [address] [place], [place] [phone]   If the Hernia comes out, lie down and push the protruding part back in if possible. It is much easier to do this while lying down.  Please review the instructions below. If you develop any signs or symptoms of a strangulated hernia, please go to the Emergency Room Immediately. These symptoms are highlighted below under, "GET HELP RIGHT AWAY IF:"  If you have any questions or concerns, please call our office and speak with a nurse.    Ventral Hernia A ventral hernia is a bulge of tissue from inside the abdomen that pushes through a weak area of the muscles that form the front wall of the abdomen. The tissues inside the abdomen are inside a sac (peritoneum). These tissues include the small intestine, large intestine, and the fatty tissue that covers the intestines (omentum). Sometimes, the bulge that forms a hernia contains intestines. Other hernias contain only fat. Ventral hernias do not go away without surgical treatment. There are several types of ventral hernias. You may have:  A hernia at an incision site from previous abdominal surgery (incisional hernia).  A hernia just above the belly button (epigastric hernia), or at the belly button (umbilical hernia). These types of hernias can develop from heavy lifting or straining.  A hernia that comes and goes (reducible hernia). It may be visible only when you lift or  strain. This type of hernia can be pushed back into the abdomen (reduced).  A hernia that traps abdominal tissue inside the hernia (incarcerated hernia). This type of hernia does not reduce.  A hernia that cuts off blood flow to the tissues inside the hernia (strangulated hernia). The tissues can start to die if this happens. This is a very painful bulge that cannot be reduced. A strangulated hernia is a medical emergency.  What are the causes? This condition is caused by abdominal tissue putting pressure on an area of weakness in the abdominal muscles. What increases the risk? The following factors may make you more likely to develop this condition:  Being female.  Being 71 or older.  Being overweight or obese.  Having had previous abdominal surgery, especially if there was an infection after surgery.  Having had an injury to the abdominal wall.  Having had several pregnancies.  Having a buildup of fluid inside the abdomen (ascites).  What are the signs or symptoms? The only symptom of a ventral hernia may be a painless bulge in the abdomen. A reducible hernia may be visible only when you strain, cough, or lift. Other symptoms may include:  Dull pain.  A feeling of pressure.  Signs and symptoms of a strangulated hernia may include:  Increasing pain.  Nausea and vomiting.  Pain when pressing on the hernia.  The skin over the hernia turning red or purple.  Constipation.  Blood in the stool (feces).  How is this diagnosed? This condition may be diagnosed based on:  Your symptoms.  Your medical history.  A physical exam. You may be asked to cough or strain while standing. These actions increase the pressure inside your abdomen and force the hernia through the opening in your muscles. Your health care provider may try to reduce the hernia by pressing on it.  Imaging studies, such as an ultrasound or CT scan.  How is this treated? This condition is treated with  surgery. If you have a strangulated hernia, surgery is done as soon as possible. If your hernia is small and not incarcerated, you may be asked to lose some weight before surgery. Follow these instructions at home:  Follow instructions from your health care provider about eating or drinking restrictions.  If you are overweight, your health care provider may recommend that you increase your activity level and eat a healthier diet.  Do not lift anything that is heavier than 10 lb (4.5 kg).  Return to your normal activities as told by your health care provider. Ask your health care provider what activities are safe for you. You may need to avoid activities that increase pressure on your hernia.  Take over-the-counter and prescription medicines only as told by your health care provider.  Keep all follow-up visits as told by your health care provider. This is important. Contact a health care provider if:  Your hernia gets larger.  Your hernia becomes painful. Get help right away if:  Your hernia becomes increasingly painful.  You have pain along with any of the following: ? Changes in skin color in the area of the hernia. ? Nausea. ? Vomiting. ? Fever. Summary  A ventral hernia is a bulge of tissue from inside the abdomen that pushes through a weak area of the muscles that form the front wall of the abdomen.  This condition is treated with surgery, which may be urgent depending on your hernia.  Do not lift anything that is heavier than 10 lb (4.5 kg), and follow activity instructions from your health care provider. This information is not intended to replace advice given to you by your health care provider. Make sure you discuss any questions you have with your health care provider. Document Released: 09/11/2012 Document Revised: 05/12/2016 Document Reviewed: 05/12/2016 Elsevier Interactive Patient Education  [employer].

## 2017-05-28 NOTE — Progress Notes (Signed)
05/28/2017  HPI: Patient is status post laparoscopic incisional hernia repair with mesh on 7/30. She presents today for postop follow-up. She reports that she still has episodes of abdominal pain that can be severe at times. She denies having any nausea or vomiting and reports tolerating a regular diet with normal bowel movements. He has any drainage from the incisions.  Vital signs: BP 118/80   Pulse (!) 108   Temp 98.3 F (36.8 C) (Oral)   Ht 5\' 3"  (1.6 m)   Wt 125.7 kg (277 lb 3.2 oz)   LMP 05/02/2017 (Exact Date)   BMI 49.10 kg/m    Physical Exam: Constitutional: No acute distress Abdomen:  Soft, nondistended, currently nontender to palpation. There is at the umbilicus an area of bulging that could represent a seroma. There is no evidence of a new hernia defect.  Assessment/Plan: 31 year old female status post incisional ventral hernia repair with mesh.  -Given her symptoms of intermittent episodes of pain in this bulge at the umbilicus, we'll obtain a CT scan of the abdomen and pelvis. Likely this may just be a seroma but do want to rule out any mesh complications. We'll call the patient with the results of the CAT scan. -In the meantime I recommended that she use an abdominal binder for support as her pain could be due to the mesh and tacks pulling on her abdominal wall.   Howie Ill, MD [facility]

## 2017-06-05 ENCOUNTER — Encounter: Payer: Self-pay | Admitting: Emergency Medicine

## 2017-06-05 ENCOUNTER — Telehealth: Payer: Self-pay | Admitting: General Practice

## 2017-06-05 ENCOUNTER — Ambulatory Visit
Admission: RE | Admit: 2017-06-05 | Discharge: 2017-06-05 | Disposition: A | Discharge status: Home / Self Care | Payer: Medicaid Other | Source: Ambulatory Visit | Attending: Surgery | Admitting: Surgery

## 2017-06-05 ENCOUNTER — Inpatient Hospital Stay
Admission: EM | Admit: 2017-06-05 | Discharge: 2017-06-07 | DRG: 445 | Disposition: A | Discharge status: Home / Self Care | Payer: Medicaid Other | Attending: Surgery | Admitting: Surgery

## 2017-06-05 ENCOUNTER — Emergency Department: Payer: Medicaid Other

## 2017-06-05 DIAGNOSIS — K8051 Calculus of bile duct without cholangitis or cholecystitis with obstruction: Principal | ICD-10-CM | POA: Diagnosis present

## 2017-06-05 DIAGNOSIS — K8050 Calculus of bile duct without cholangitis or cholecystitis without obstruction: Secondary | ICD-10-CM | POA: Diagnosis not present

## 2017-06-05 DIAGNOSIS — R17 Unspecified jaundice: Secondary | ICD-10-CM

## 2017-06-05 DIAGNOSIS — R109 Unspecified abdominal pain: Secondary | ICD-10-CM

## 2017-06-05 DIAGNOSIS — Z6841 Body Mass Index (BMI) 40.0 and over, adult: Secondary | ICD-10-CM | POA: Diagnosis not present

## 2017-06-05 DIAGNOSIS — R1011 Right upper quadrant pain: Secondary | ICD-10-CM | POA: Diagnosis present

## 2017-06-05 DIAGNOSIS — F17210 Nicotine dependence, cigarettes, uncomplicated: Secondary | ICD-10-CM | POA: Diagnosis present

## 2017-06-05 DIAGNOSIS — Z8249 Family history of ischemic heart disease and other diseases of the circulatory system: Secondary | ICD-10-CM | POA: Diagnosis not present

## 2017-06-05 DIAGNOSIS — K831 Obstruction of bile duct: Secondary | ICD-10-CM

## 2017-06-05 DIAGNOSIS — Z825 Family history of asthma and other chronic lower respiratory diseases: Secondary | ICD-10-CM | POA: Diagnosis not present

## 2017-06-05 LAB — CBC
HCT: 37.8 % (ref 35.0–47.0)
Hemoglobin: 12.6 g/dL (ref 12.0–16.0)
MCH: 26.6 pg (ref 26.0–34.0)
MCHC: 33.4 g/dL (ref 32.0–36.0)
MCV: 79.6 fL — AB (ref 80.0–100.0)
PLATELETS: 353 10*3/uL (ref 150–440)
RBC: 4.74 MIL/uL (ref 3.80–5.20)
RDW: 15.5 % — AB (ref 11.5–14.5)
WBC: 8.7 10*3/uL (ref 3.6–11.0)

## 2017-06-05 LAB — COMPREHENSIVE METABOLIC PANEL
ALK PHOS: 193 U/L — AB (ref 38–126)
ALT: 466 U/L — AB (ref 14–54)
AST: 489 U/L — AB (ref 15–41)
Albumin: 3.8 g/dL (ref 3.5–5.0)
Anion gap: 10 (ref 5–15)
CALCIUM: 9.1 mg/dL (ref 8.9–10.3)
CHLORIDE: 106 mmol/L (ref 101–111)
CO2: 20 mmol/L — ABNORMAL LOW (ref 22–32)
CREATININE: 0.4 mg/dL — AB (ref 0.44–1.00)
Glucose, Bld: 131 mg/dL — ABNORMAL HIGH (ref 65–99)
Potassium: 3.3 mmol/L — ABNORMAL LOW (ref 3.5–5.1)
Sodium: 136 mmol/L (ref 135–145)
Total Bilirubin: 5.1 mg/dL — ABNORMAL HIGH (ref 0.3–1.2)
Total Protein: 8.2 g/dL — ABNORMAL HIGH (ref 6.5–8.1)

## 2017-06-05 LAB — URINALYSIS, COMPLETE (UACMP) WITH MICROSCOPIC
Bacteria, UA: NONE SEEN
GLUCOSE, UA: NEGATIVE mg/dL
Ketones, ur: NEGATIVE mg/dL
Leukocytes, UA: NEGATIVE
Nitrite: NEGATIVE
PH: 5 (ref 5.0–8.0)
Protein, ur: 100 mg/dL — AB
Specific Gravity, Urine: >1.046 — ABNORMAL HIGH (ref 1.005–1.030)

## 2017-06-05 LAB — LIPASE, BLOOD: LIPASE: 45 U/L (ref 11–51)

## 2017-06-05 MED ORDER — PNEUMOCOCCAL VAC POLYVALENT 25 MCG/0.5ML IJ INJ: 0.5000 mL | INJECTION | INTRAMUSCULAR | Status: DC

## 2017-06-05 MED ORDER — ONDANSETRON HCL 4 MG/2ML IJ SOLN
4.0000 mg | Freq: Once | INTRAMUSCULAR | Status: AC
  Administered 2017-06-05: 4 mg via INTRAVENOUS
  Filled 2017-06-05: qty 2

## 2017-06-05 MED ORDER — SODIUM CHLORIDE 0.9 % IV SOLN
3.0000 g | Freq: Four times a day (QID) | INTRAVENOUS | Status: DC
  Administered 2017-06-05 – 2017-06-07 (×5): 3 g via INTRAVENOUS
  Filled 2017-06-05 (×11): qty 3

## 2017-06-05 MED ORDER — HYDROMORPHONE HCL 1 MG/ML IJ SOLN
1.0000 mg | Freq: Once | INTRAMUSCULAR | Status: AC
  Administered 2017-06-05: 1 mg via INTRAVENOUS
  Filled 2017-06-05: qty 1

## 2017-06-05 MED ORDER — ONDANSETRON HCL 4 MG/2ML IJ SOLN: 4.0000 mg | Freq: Four times a day (QID) | INTRAMUSCULAR | Status: DC | PRN

## 2017-06-05 MED ORDER — HEPARIN SODIUM (PORCINE) 5000 UNIT/ML IJ SOLN
5000.0000 [IU] | Freq: Three times a day (TID) | INTRAMUSCULAR | Status: DC
  Administered 2017-06-05 – 2017-06-07 (×4): 5000 [IU] via SUBCUTANEOUS
  Filled 2017-06-05 (×4): qty 1

## 2017-06-05 MED ORDER — HYDROMORPHONE HCL 1 MG/ML IJ SOLN
0.5000 mg | INTRAMUSCULAR | Status: DC | PRN
  Administered 2017-06-05 – 2017-06-06 (×5): 0.5 mg via INTRAVENOUS
  Filled 2017-06-05 (×6): qty 0.5

## 2017-06-05 MED ORDER — IOPAMIDOL (ISOVUE-300) INJECTION 61%
100.0000 mL | Freq: Once | INTRAVENOUS | Status: AC | PRN
  Administered 2017-06-05: 100 mL via INTRAVENOUS

## 2017-06-05 MED ORDER — SODIUM CHLORIDE 0.9 % IV BOLUS (SEPSIS)
1000.0000 mL | Freq: Once | INTRAVENOUS | Status: AC
  Administered 2017-06-05: 1000 mL via INTRAVENOUS

## 2017-06-05 MED ORDER — ONDANSETRON HCL 4 MG PO TABS: 4.0000 mg | ORAL_TABLET | Freq: Four times a day (QID) | ORAL | Status: DC | PRN

## 2017-06-05 MED ORDER — DEXTROSE IN LACTATED RINGERS 5 % IV SOLN
INTRAVENOUS | Status: DC
  Administered 2017-06-05 – 2017-06-07 (×5): via INTRAVENOUS
  Filled 2017-06-05 (×4): qty 1000

## 2017-06-05 NOTE — ED Triage Notes (Signed)
Abdominal pain and vomiting x 2 days.  Low back pain x 3 days.  Had hernia repair surgery 4 weeks ago.  Patient had outpatient CT Scan to abdomen this morning because "I have a hernia in my stomach that is inflated".

## 2017-06-05 NOTE — ED Provider Notes (Signed)
[facility] Emergency Department Provider Note  ____________________________________________  Time seen: Approximately 5:18 PM  I have reviewed the triage vital signs and the nursing notes.   HISTORY  Chief Complaint Abdominal Pain and Emesis    HPI [name] is a 31 y.o. female comes to the ED complaining of generalized abdominal pain with vomiting for the past 2 days. Vomitingand pain are worse after eating. Also associated with low back pain for the past 2 days. She had a hernia repair surgery with a ventral mesh 4 weeks ago. Today she had an outpatient CT scan and then came to the ED right afterward due to her severe pain. Denies fever or chills. No alleviating factors. Moderate to severe, sharp.     Past Medical History:  Diagnosis Date  . Abdominal pain affecting pregnancy, antepartum 02/28/2016  . Anemia    LAST HGB 04-03-17 12.5  . Anxiety   . Depression   . Gallstones 2018  . Incisional ventral hernia w obstruction   . Obesity 09-13-2013   Last Assessment & Plan:  Obesity in pregnancy is associated with maternal and obstetric complications including increased risk of gestational DM, gestational HTN, preeclampsia, thromboembolism, failed induction of labor, Cesarean section, excessive gestational weight gain and post-partum weight retention. Obesity is also associated with fetal complications including prematurity, stillbirth, congen  . Post-operative state June 15, 2016  . SIDS (sudden infant death syndrome) 09/13/2013   Last Assessment & Plan:  Ms. Kincannon third son died at three months of age from SIDS. I reviewed with Ms. Fore that the cause of SIDS is unknown. I did review with her that placing infants to sleep on their backs without soft bedding and tobacco cessation are potentially modifiable risk factors to decrease her risk of SIDS.  Marland Kitchen Small bowel obstruction (HCC)   . Supervision of high risk pregnancy in third trimester 09-13-13    Last Assessment & Plan:  Ms. Winiarski is receiving her care at [facility]. She reports having an appointment tomorrow 12/3. I encouraged her to get a flu and TDAP vaccination. She is due for her CBC, RPR, third trimester HIV and glucola.   . Tobacco abuse 13-Sep-2013   Last Assessment & Plan:  I discussed with Ms. Elmendorf that the risks of smoking in pregnancy include spontaneous pregnancy loss, placental abruption, preterm premature rupture of membranes (PPROM), placenta previa, preterm labor and delivery, and low birth weight (LBW) as well as SIDS. I discussed with her that her tobacco use is a modifiable risk factor for risk of placental abruption and SIDS, b     Patient Active Problem List   Diagnosis Date Noted  . Incisional hernia 05/07/2017  . Incisional ventral hernia w obstruction   . Small bowel obstruction (HCC)   . Post-operative state 06/15/2016  . Abdominal pain affecting pregnancy, antepartum 02/28/2016  . IUFD (intrauterine fetal death) 09/13/13  . Obesity Sep 13, 2013  . SIDS (sudden infant death syndrome) 09/13/2013  . Supervision of high risk pregnancy in third trimester 09/13/13  . Tobacco abuse Sep 13, 2013     Past Surgical History:  Procedure Laterality Date  . CESAREAN SECTION N/A 06/12/2016   Procedure: STAT CESAREAN SECTION WITH BILATERAL STERILIZATION;  Surgeon: Suzy Bouchard, MD;  Location: [facility] ORS;  Service: Obstetrics;  Laterality: N/A;  Baby Girl @ 2246 Weight 5 lb 10 oz Apgars 8/9  . INCISIONAL HERNIA REPAIR N/A 05/07/2017   Procedure: LAPAROSCOPIC INCISIONAL HERNIA;  Surgeon: Henrene Dodge, MD;  Location: [facility] ORS;  Service: General;  Laterality: N/A;  . TUBAL LIGATION    . VENTRAL HERNIA REPAIR N/A 06/19/2016   Procedure: HERNIA REPAIR VENTRAL ADULT;  Surgeon: Lattie Haw, MD;  Location: [facility] ORS;  Service: General;  Laterality: N/A;     Prior to Admission medications   Medication Sig Start Date End Date Taking? Authorizing Provider   ibuprofen (ADVIL,MOTRIN) 600 MG tablet Take 1 tablet (600 mg total) by mouth every 8 (eight) hours as needed for fever or mild pain. 05/08/17   Henrene Dodge, MD     Allergies Patient has no known allergies.   Family History  Problem Relation Age of Onset  . Diabetes Mother   . Asthma Sister   . Hypertension Maternal Aunt   . Hypertension Maternal Uncle     Social History Social History  Substance Use Topics  . Smoking status: Current Every Day Smoker    Packs/day: 1.00    Years: 10.00    Types: Cigarettes  . Smokeless tobacco: Never Used  . Alcohol use No     Comment: 2 40oz/day    Review of Systems  Constitutional:   No fever or chills.  ENT:   No sore throat. No rhinorrhea. Cardiovascular:   No chest pain or syncope. Respiratory:   No dyspnea or cough. Gastrointestinal:   Positive as above for abdominal pain and vomiting. No constipation or diarrhea.  Musculoskeletal:   Negative for focal pain or swelling All other systems reviewed and are negative except as documented above in ROS and HPI.  ____________________________________________   PHYSICAL EXAM:  VITAL SIGNS: ED Triage Vitals  Enc Vitals Group     BP 06/05/17 1338 (!) 147/86     Pulse Rate 06/05/17 1338 90     Resp 06/05/17 1338 16     Temp 06/05/17 1338 97.6 F (36.4 C)     Temp Source 06/05/17 1338 Oral     SpO2 06/05/17 1338 97 %     Weight 06/05/17 1337 277 lb (125.6 kg)     Height 06/05/17 1337 5\' 3"  (1.6 m)     Head Circumference --      Peak Flow --      Pain Score 06/05/17 1336 10     Pain Loc --      Pain Edu? --      Excl. in GC? --     Vital signs reviewed, nursing assessments reviewed.   Constitutional:   Alert and oriented. Not in distress Eyes:   No scleral icterus.  EOMI. No nystagmus. No conjunctival pallor. PERRL. ENT   Head:   Normocephalic and atraumatic.   Nose:   No congestion/rhinnorhea.    Mouth/Throat:   MMM, no pharyngeal erythema. No peritonsillar  mass.    Neck:   No meningismus. Full ROM Hematological/Lymphatic/Immunilogical:   No cervical lymphadenopathy. Cardiovascular:   RRR. Symmetric bilateral radial and DP pulses.  No murmurs.  Respiratory:   Normal respiratory effort without tachypnea/retractions. Breath sounds are clear and equal bilaterally. No wheezes/rales/rhonchi. Gastrointestinal:   Soft with right upper quadrant tenderness.. Non distended. There is no CVA tenderness.  No rebound, rigidity, or guarding.no bulging abdominal wall Genitourinary:   deferred Musculoskeletal:   Normal range of motion in all extremities. No joint effusions.  No lower extremity tenderness.  No edema. Neurologic:   Normal speech and language.  Motor grossly intact. No gross focal neurologic deficits are appreciated.  Skin:    Skin is warm, dry and intact. No rash noted.  No petechiae, purpura, or bullae.  ____________________________________________    LABS (pertinent positives/negatives) (all labs ordered are listed, but only abnormal results are displayed) Labs Reviewed  COMPREHENSIVE METABOLIC PANEL - Abnormal; Notable for the following:       Result Value   Potassium 3.3 (*)    CO2 20 (*)    Glucose, Bld 131 (*)    BUN <5 (*)    Creatinine, Ser 0.40 (*)    Total Protein 8.2 (*)    AST 489 (*)    ALT 466 (*)    Alkaline Phosphatase 193 (*)    Total Bilirubin 5.1 (*)    All other components within normal limits  CBC - Abnormal; Notable for the following:    MCV 79.6 (*)    RDW 15.5 (*)    All other components within normal limits  URINALYSIS, COMPLETE (UACMP) WITH MICROSCOPIC - Abnormal; Notable for the following:    Color, Urine AMBER (*)    APPearance CLEAR (*)    Specific Gravity, Urine >1.046 (*)    Hgb urine dipstick LARGE (*)    Bilirubin Urine SMALL (*)    Protein, ur 100 (*)    Squamous Epithelial / LPF 0-5 (*)    All other components within normal limits  LIPASE, BLOOD    ____________________________________________   EKG    ____________________________________________    RADIOLOGY  Ct Abdomen Pelvis W Contrast  Result Date: 06/05/2017 CLINICAL DATA:  Right flank pain for 1 week. Patient had incisional hernia repair with mesh on July 30th. EXAM: CT ABDOMEN AND PELVIS WITH CONTRAST TECHNIQUE: Multidetector CT imaging of the abdomen and pelvis was performed using the standard protocol following bolus administration of intravenous contrast. CONTRAST:  ISOVUE-300 IOPAMIDOL (ISOVUE-300) INJECTION 61% COMPARISON:  April 04, 2017 FINDINGS: Lower chest: There is mild atelectasis of the posterior lung bases. The heart size is normal. Hepatobiliary: No focal liver abnormality is seen. Contracted gallbladder with gallstones are identified unchanged. No biliary dilatation. Pancreas: Unremarkable. No pancreatic ductal dilatation or surrounding inflammatory changes. Spleen: Normal in size without focal abnormality. Adrenals/Urinary Tract: Adrenal glands are unremarkable. Kidneys are normal, without renal calculi, focal lesion, or hydronephrosis. Bladder is unremarkable. Stomach/Bowel: There is mild thick walled right colon with surrounding inflammation. There is no evidence of large or small bowel obstruction. Vascular/Lymphatic: No significant vascular findings are present. No enlarged abdominal or pelvic lymph nodes. Reproductive: Uterus and bilateral adnexa are unremarkable. Other: Patient status post prior hernia repair with anterior lower abdomen-in place. There is 5.7 x 2.4 x 5.4 cm fluid collection in the midline lower anterior abdominal/pelvic wall. There is also a 4.1 x 4.8 cm focal area fluid in the place of previous noted herniated fat of the anterior abdominal subcutaneous fat just to the left of midline. Musculoskeletal: No acute or significant osseous findings. IMPRESSION: Status post prior anterior abdominal wall hernia replacing with a 5.7 cm fluid collection  in the place the repair. Abscess is not excluded. The previous space that was occupied herniated by fat in the anterior abdominal subcutaneous fat to left of midline is now replaced by an area fluid. Mild thick walled right colon with surrounding inflammation suggesting colitis. Electronically Signed   By: Sherian Rein M.D.   On: 06/05/2017 14:23   US Abdomen Limited Ruq  Result Date: 06/05/2017 CLINICAL DATA:  Abdomen pain and vomiting for 2 days. EXAM: ULTRASOUND ABDOMEN LIMITED RIGHT UPPER QUADRANT COMPARISON:  None. FINDINGS: Gallbladder: Gallstones are identified in a decompressed gallbladder.  The ultrasound technologist reports no sonographic Murphy sign but the patient has been medicated. No significant pericholecystic fluid noted. Gallbladder wall measures 2.7 mm. Common bile duct: Diameter: Range from 4.4 mm to 9.6 mm. Liver: No focal lesion identified. Minimal diffuse increased echotexture of liver is identified. Portal vein is patent on color Doppler imaging with normal direction of blood flow towards the liver. IMPRESSION: Gallstones are identified in the decompressed bladder. The ultrasound technologist reports no sonographic Murphy sign. There is no prior cholecystic fluid. The gallbladder wall measures 2.7 mm. The findings are equivocal for cholecystitis. The common bowel duct range from 4.4 mm 9.6 mm, choledocolithiasis is not excluded. Electronically Signed   By: Sherian Rein M.D.   On: 06/05/2017 15:58    ____________________________________________   PROCEDURES Procedures  ____________________________________________   INITIAL IMPRESSION / ASSESSMENT AND PLAN / ED COURSE  Pertinent labs & imaging results that were available during my care of the patient were reviewed by me and considered in my medical decision making (see chart for details).  Patient presents with right upper quadrant tenderness, postprandial pain and vomiting, elevated LFTs concerning for  choledocholithiasis and possible cholecystitis. We'll order ultrasound, IV fluids Dilaudid and Zofran.  Clinical Course as of Jun 05 1717  Tue Jun 05, 2017  1715 D/w Dr. Excell Seltzer who will eval for admission.  I also spoke with Dr. Servando Snare of GI regarding possible ERCP, which he may be able to complete tomorrow.   [PS]    Clinical Course User Index [PS] Sharman Cheek, MD     ----------------------------------------- 5:23 PM on 06/05/2017 -----------------------------------------  Patient with persistent right upper quad tenderness. To be evaluated by general surgery for admission.  ____________________________________________   FINAL CLINICAL IMPRESSION(S) / ED DIAGNOSES  Final diagnoses:  Calculus of bile duct without cholecystitis with obstruction  Right upper quadrant abdominal pain      New Prescriptions   No medications on file     Portions of this note were generated with dragon dictation software. Dictation errors may occur despite best attempts at proofreading.    Sharman Cheek, MD 06/05/17 1723

## 2017-06-05 NOTE — Telephone Encounter (Signed)
Call returned to patient at this time. I explained that since she is currently in the ED, she will need to wait on testing to be resulted. She states that the ED is taking forever and wanted to know what I could do to speed up the process. I explained that she needs to be patient and let the Emergency Department do what is necessary to see what is wrong with her. Then if the surgeon is needed, he is in the hospital and they can call him down to see her. She was not happy with this answer but will wait to be seen in ED.

## 2017-06-05 NOTE — Telephone Encounter (Signed)
Patient called said she is having pain, pain level is past 10, sweating, vomiting and having back pain. Patient had surgery done on 05/07/17 she had laparoscopic incisional hernia with Dr. Aleen Campi. Please call patient and advice.

## 2017-06-05 NOTE — ED Triage Notes (Signed)
FIRST NURSE NOTE-pt had hernia surgery and having abdominal pain. Just had CT today but cannot wait for results per pt. Ambulatory without difficulty.

## 2017-06-05 NOTE — H&P (Signed)
[name] is an 31 y.o. female.    Chief Complaint:back pain  HPI: 3 days back pain, nausea, emesis. No F/C. she has known about gallstones for several months but has never had severe attacks like she has had recently. Her back pain is her chief complaint but she also has right upper quadrant pain as well she had nausea vomiting no fevers or chills and has had dark urine since yesterday. She states she was told once before that she had bilirubin in her urine. Previously.  Of significance to recap her recent surgical history last September she had a C-section and shortly thereafter had an incarcerated ventral hernia with bowel present and had a primary closure by me. No mesh was utilized due to the incarcerated small bowel. This hernia promptly recurred and was repaired laparoscopically last month by Dr. Hampton Abbot. She states that she thought she was having her gallbladder removed but had a ventral hernia repaired instead.  She denies medical allergies or other medical problems. She has a family history of gallbladder disease. She has a 63-year-old baby.   Past Medical History:  Diagnosis Date  . Abdominal pain affecting pregnancy, antepartum 02/28/2016  . Anemia    LAST HGB 04-03-17 12.5  . Anxiety   . Depression   . Gallstones 2018  . Incisional ventral hernia w obstruction   . Obesity 22-Sep-2013   Last Assessment & Plan:  Obesity in pregnancy is associated with maternal and obstetric complications including increased risk of gestational DM, gestational HTN, preeclampsia, thromboembolism, failed induction of labor, Cesarean section, excessive gestational weight gain and post-partum weight retention. Obesity is also associated with fetal complications including prematurity, stillbirth, congen  . Post-operative state 2016/06/24  . SIDS (sudden infant death syndrome) 2013/09/22   Last Assessment & Plan:  Ms. Lindo third son died at three months of age from [place]. I reviewed with Ms. Eberwein  that the cause of SIDS is unknown. I did review with her that placing infants to sleep on their backs without soft bedding and tobacco cessation are potentially modifiable risk factors to decrease her risk of SIDS.  Marland Kitchen Small bowel obstruction ([place])   . Supervision of high risk pregnancy in third trimester 09-22-2013   Last Assessment & Plan:  Ms. Mckeon is receiving her care at [place]. She reports having an appointment tomorrow 12/3. I encouraged her to get a flu and TDAP vaccination. She is due for her CBC, RPR, third trimester HIV and glucola.   . Tobacco abuse 22-Sep-2013   Last Assessment & Plan:  I discussed with Ms. Carlin that the risks of smoking in pregnancy include spontaneous pregnancy loss, placental abruption, preterm premature rupture of membranes (PPROM), placenta previa, preterm labor and delivery, and low birth weight (LBW) as well as SIDS. I discussed with her that her tobacco use is a modifiable risk factor for risk of placental abruption and SIDS, b    Past Surgical History:  Procedure Laterality Date  . CESAREAN SECTION N/A 06/12/2016   Procedure: STAT CESAREAN SECTION WITH BILATERAL STERILIZATION;  Surgeon: Boykin Nearing, MD;  Location: [facility] ORS;  Service: Obstetrics;  Laterality: N/A;  Baby Girl @ 2246 Weight 5 lb 10 oz Apgars 8/9  . INCISIONAL HERNIA REPAIR N/A 05/07/2017   Procedure: LAPAROSCOPIC INCISIONAL HERNIA;  Surgeon: Olean Ree, MD;  Location: [facility] ORS;  Service: General;  Laterality: N/A;  . TUBAL LIGATION    . VENTRAL HERNIA REPAIR N/A 06/19/2016   Procedure: HERNIA REPAIR VENTRAL  ADULT;  Surgeon: Florene Glen, MD;  Location: [facility] ORS;  Service: General;  Laterality: N/A;    Family History  Problem Relation Age of Onset  . Diabetes Mother   . Asthma Sister   . Hypertension Maternal Aunt   . Hypertension Maternal Uncle    Social History:  reports that she has been smoking Cigarettes.  She has a 10.00 pack-year smoking history. She has  never used smokeless tobacco. She reports that she does not drink alcohol or use drugs. Patient is at home with her young child.  Allergies: No Known Allergies   (Not in a hospital admission)   Review of Systems  Constitutional: Negative.   HENT: Negative.   Eyes: Negative.   Respiratory: Negative.   Cardiovascular: Negative.   Gastrointestinal: Positive for abdominal pain, nausea and vomiting. Negative for blood in stool, constipation, diarrhea and heartburn.  Genitourinary: Positive for flank pain.  Skin: Negative.   Neurological: Negative.   Endo/Heme/Allergies: Negative.   Psychiatric/Behavioral: Negative.      Physical Exam:  BP 124/80   Pulse 84   Temp 97.6 F (36.4 C) (Oral)   Resp 16   Ht '5\' 3"'  (1.6 m)   Wt 277 lb (125.6 kg)   LMP 06/04/2017   SpO2 99%   BMI 49.07 kg/m   Physical Exam  Constitutional: She is oriented to person, place, and time and well-developed, well-nourished, and in no distress. No distress.  Uncomfortable-appearing morbidly obese female patient  HENT:  Head: Normocephalic and atraumatic.  Eyes: Pupils are equal, round, and reactive to light. Right eye exhibits no discharge. Left eye exhibits no discharge. No scleral icterus.  Neck: Normal range of motion. No JVD present.  Cardiovascular: Normal rate, regular rhythm and normal heart sounds.   Pulmonary/Chest: Effort normal and breath sounds normal. No respiratory distress. She has no wheezes. She has no rales.  Abdominal: Soft. She exhibits no distension. There is no tenderness. There is no rebound and no guarding.  Tender in the right upper quadrant with a positive Murphy sign minimal right CVA tenderness  Musculoskeletal: Normal range of motion. She exhibits no edema or tenderness.  Lymphadenopathy:    She has no cervical adenopathy.  Neurological: She is alert and oriented to person, place, and time.  Skin: Skin is warm and dry. No rash noted. She is not diaphoretic. No erythema.   Psychiatric: Mood and affect normal.  Vitals reviewed.       Results for orders placed or performed during the hospital encounter of 06/05/17 (from the past 48 hour(s))  Lipase, blood     Status: None   Collection Time: 06/05/17  1:38 PM  Result Value Ref Range   Lipase 45 11 - 51 U/L  Comprehensive metabolic panel     Status: Abnormal   Collection Time: 06/05/17  1:38 PM  Result Value Ref Range   Sodium 136 135 - 145 mmol/L   Potassium 3.3 (L) 3.5 - 5.1 mmol/L   Chloride 106 101 - 111 mmol/L   CO2 20 (L) 22 - 32 mmol/L   Glucose, Bld 131 (H) 65 - 99 mg/dL   BUN <5 (L) 6 - 20 mg/dL   Creatinine, Ser 0.40 (L) 0.44 - 1.00 mg/dL   Calcium 9.1 8.9 - 10.3 mg/dL   Total Protein 8.2 (H) 6.5 - 8.1 g/dL   Albumin 3.8 3.5 - 5.0 g/dL   AST 489 (H) 15 - 41 U/L   ALT 466 (H) 14 - 54 U/L  Alkaline Phosphatase 193 (H) 38 - 126 U/L   Total Bilirubin 5.1 (H) 0.3 - 1.2 mg/dL   GFR calc non Af Amer >60 >60 mL/min   GFR calc Af Amer >60 >60 mL/min    Comment: (NOTE) The eGFR has been calculated using the CKD EPI equation. This calculation has not been validated in all clinical situations. eGFR's persistently <60 mL/min signify possible Chronic Kidney Disease.    Anion gap 10 5 - 15  CBC     Status: Abnormal   Collection Time: 06/05/17  1:38 PM  Result Value Ref Range   WBC 8.7 3.6 - 11.0 K/uL   RBC 4.74 3.80 - 5.20 MIL/uL   Hemoglobin 12.6 12.0 - 16.0 g/dL   HCT 37.8 35.0 - 47.0 %   MCV 79.6 (L) 80.0 - 100.0 fL   MCH 26.6 26.0 - 34.0 pg   MCHC 33.4 32.0 - 36.0 g/dL   RDW 15.5 (H) 11.5 - 14.5 %   Platelets 353 150 - 440 K/uL  Urinalysis, Complete w Microscopic     Status: Abnormal   Collection Time: 06/05/17  1:38 PM  Result Value Ref Range   Color, Urine AMBER (A) YELLOW    Comment: BIOCHEMICALS MAY BE AFFECTED BY COLOR   APPearance CLEAR (A) CLEAR   Specific Gravity, Urine >1.046 (H) 1.005 - 1.030   pH 5.0 5.0 - 8.0   Glucose, UA NEGATIVE NEGATIVE mg/dL   Hgb urine  dipstick LARGE (A) NEGATIVE   Bilirubin Urine SMALL (A) NEGATIVE   Ketones, ur NEGATIVE NEGATIVE mg/dL   Protein, ur 100 (A) NEGATIVE mg/dL   Nitrite NEGATIVE NEGATIVE   Leukocytes, UA NEGATIVE NEGATIVE   RBC / HPF TOO NUMEROUS TO COUNT 0 - 5 RBC/hpf   WBC, UA 6-30 0 - 5 WBC/hpf   Bacteria, UA NONE SEEN NONE SEEN   Squamous Epithelial / LPF 0-5 (A) NONE SEEN   Ct Abdomen Pelvis W Contrast  Result Date: 06/05/2017 CLINICAL DATA:  Right flank pain for 1 week. Patient had incisional hernia repair with mesh on July 30th. EXAM: CT ABDOMEN AND PELVIS WITH CONTRAST TECHNIQUE: Multidetector CT imaging of the abdomen and pelvis was performed using the standard protocol following bolus administration of intravenous contrast. CONTRAST:  142m ISOVUE-300 IOPAMIDOL (ISOVUE-300) INJECTION 61% COMPARISON:  April 04, 2017 FINDINGS: Lower chest: There is mild atelectasis of the posterior lung bases. The heart size is normal. Hepatobiliary: No focal liver abnormality is seen. Contracted gallbladder with gallstones are identified unchanged. No biliary dilatation. Pancreas: Unremarkable. No pancreatic ductal dilatation or surrounding inflammatory changes. Spleen: Normal in size without focal abnormality. Adrenals/Urinary Tract: Adrenal glands are unremarkable. Kidneys are normal, without renal calculi, focal lesion, or hydronephrosis. Bladder is unremarkable. Stomach/Bowel: There is mild thick walled right colon with surrounding inflammation. There is no evidence of large or small bowel obstruction. Vascular/Lymphatic: No significant vascular findings are present. No enlarged abdominal or pelvic lymph nodes. Reproductive: Uterus and bilateral adnexa are unremarkable. Other: Patient status post prior hernia repair with anterior lower abdomen-in place. There is 5.7 x 2.4 x 5.4 cm fluid collection in the midline lower anterior abdominal/pelvic wall. There is also a 4.1 x 4.8 cm focal area fluid in the place of previous noted  herniated fat of the anterior abdominal subcutaneous fat just to the left of midline. Musculoskeletal: No acute or significant osseous findings. IMPRESSION: Status post prior anterior abdominal wall hernia replacing with a 5.7 cm fluid collection in the place the repair. Abscess  is not excluded. The previous space that was occupied herniated by fat in the anterior abdominal subcutaneous fat to left of midline is now replaced by an area fluid. Mild thick walled right colon with surrounding inflammation suggesting colitis. Electronically Signed   By: Abelardo Diesel M.D.   On: 06/05/2017 14:23   US Abdomen Limited Ruq  Result Date: 06/05/2017 CLINICAL DATA:  Abdomen pain and vomiting for 2 days. EXAM: ULTRASOUND ABDOMEN LIMITED RIGHT UPPER QUADRANT COMPARISON:  None. FINDINGS: Gallbladder: Gallstones are identified in a decompressed gallbladder. The ultrasound technologist reports no sonographic Murphy sign but the patient has been medicated. No significant pericholecystic fluid noted. Gallbladder wall measures 2.7 mm. Common bile duct: Diameter: Range from 4.4 mm to 9.6 mm. Liver: No focal lesion identified. Minimal diffuse increased echotexture of liver is identified. Portal vein is patent on color Doppler imaging with normal direction of blood flow towards the liver. IMPRESSION: Gallstones are identified in the decompressed bladder. The ultrasound technologist reports no sonographic Murphy sign. There is no prior cholecystic fluid. The gallbladder wall measures 2.7 mm. The findings are equivocal for cholecystitis. The common bowel duct range from 4.4 mm 9.6 mm, choledocolithiasis is not excluded. Electronically Signed   By: Abelardo Diesel M.D.   On: 06/05/2017 15:58     Assessment/Plan  Patient is well-known to me she is 1 year post partum and underwent an emergency repair of an incarcerated ventral hernia following a C-section last year. That her hernia repair occurred and was repaired recently  laparoscopically. She now has signs of choledocholithiasis. Her CT scan has been reviewed personally showing a small seroma and contracted gallbladder with dilated common bile duct this is confirmed on ultrasound as well. Her LFTs are elevated. I discussed this with Dr. Joni Fears and with Dr. Lucilla Lame who will proceed with ERCP tomorrow. I discussed this with the patient and the rationale for this approach was discussed with her and the need for cholecystectomy in the near future was discussed as well. She understood this plan and agreed to proceed.  Florene Glen, MD, FACS

## 2017-06-05 NOTE — ED Notes (Signed)
Patient transported to Ultrasound 

## 2017-06-06 ENCOUNTER — Inpatient Hospital Stay: Payer: Medicaid Other | Admitting: Anesthesiology

## 2017-06-06 ENCOUNTER — Encounter: Payer: Self-pay | Admitting: Anesthesiology

## 2017-06-06 ENCOUNTER — Encounter
Admission: EM | Disposition: A | Discharge status: Home / Self Care | Payer: Self-pay | Source: Home / Self Care | Attending: Surgery

## 2017-06-06 ENCOUNTER — Inpatient Hospital Stay: Payer: Medicaid Other

## 2017-06-06 DIAGNOSIS — R17 Unspecified jaundice: Secondary | ICD-10-CM

## 2017-06-06 DIAGNOSIS — K831 Obstruction of bile duct: Secondary | ICD-10-CM

## 2017-06-06 HISTORY — PX: ENDOSCOPIC RETROGRADE CHOLANGIOPANCREATOGRAPHY (ERCP) WITH PROPOFOL: SHX5810

## 2017-06-06 LAB — CBC
HCT: 33.8 % — ABNORMAL LOW (ref 35.0–47.0)
HEMOGLOBIN: 11.3 g/dL — AB (ref 12.0–16.0)
MCH: 26.6 pg (ref 26.0–34.0)
MCHC: 33.5 g/dL (ref 32.0–36.0)
MCV: 79.4 fL — ABNORMAL LOW (ref 80.0–100.0)
PLATELETS: 303 10*3/uL (ref 150–440)
RBC: 4.26 MIL/uL (ref 3.80–5.20)
RDW: 15.3 % — ABNORMAL HIGH (ref 11.5–14.5)
WBC: 11.8 10*3/uL — ABNORMAL HIGH (ref 3.6–11.0)

## 2017-06-06 LAB — COMPREHENSIVE METABOLIC PANEL
ALK PHOS: 179 U/L — AB (ref 38–126)
ALT: 320 U/L — AB (ref 14–54)
ANION GAP: 7 (ref 5–15)
AST: 182 U/L — ABNORMAL HIGH (ref 15–41)
Albumin: 3 g/dL — ABNORMAL LOW (ref 3.5–5.0)
BILIRUBIN TOTAL: 4.9 mg/dL — AB (ref 0.3–1.2)
BUN: <5 mg/dL — ABNORMAL LOW (ref 6–20)
CALCIUM: 8.7 mg/dL — AB (ref 8.9–10.3)
CO2: 25 mmol/L (ref 22–32)
CREATININE: 0.32 mg/dL — AB (ref 0.44–1.00)
Chloride: 107 mmol/L (ref 101–111)
Glucose, Bld: 131 mg/dL — ABNORMAL HIGH (ref 65–99)
Potassium: 3 mmol/L — ABNORMAL LOW (ref 3.5–5.1)
Sodium: 139 mmol/L (ref 135–145)
TOTAL PROTEIN: 7.1 g/dL (ref 6.5–8.1)

## 2017-06-06 SURGERY — ENDOSCOPIC RETROGRADE CHOLANGIOPANCREATOGRAPHY (ERCP) WITH PROPOFOL
Anesthesia: General

## 2017-06-06 MED ORDER — FENTANYL CITRATE (PF) 100 MCG/2ML IJ SOLN
INTRAMUSCULAR | Status: AC
  Filled 2017-06-06: qty 2

## 2017-06-06 MED ORDER — SUGAMMADEX SODIUM 200 MG/2ML IV SOLN
INTRAVENOUS | Status: DC | PRN
  Administered 2017-06-06: 250 mg via INTRAVENOUS

## 2017-06-06 MED ORDER — INDOMETHACIN 50 MG RE SUPP: 100.0000 mg | Freq: Once | RECTAL | Status: DC

## 2017-06-06 MED ORDER — SUGAMMADEX SODIUM 500 MG/5ML IV SOLN
INTRAVENOUS | Status: AC
  Filled 2017-06-06: qty 5

## 2017-06-06 MED ORDER — SUCCINYLCHOLINE CHLORIDE 20 MG/ML IJ SOLN
INTRAMUSCULAR | Status: DC | PRN
  Administered 2017-06-06: 120 mg via INTRAVENOUS

## 2017-06-06 MED ORDER — DEXAMETHASONE SODIUM PHOSPHATE 10 MG/ML IJ SOLN
INTRAMUSCULAR | Status: AC
  Filled 2017-06-06: qty 1

## 2017-06-06 MED ORDER — INDOMETHACIN 50 MG RE SUPP
RECTAL | Status: AC
  Filled 2017-06-06: qty 2

## 2017-06-06 MED ORDER — HYDROMORPHONE HCL 1 MG/ML IJ SOLN
0.5000 mg | Freq: Once | INTRAMUSCULAR | Status: AC
  Administered 2017-06-06: 0.5 mg via INTRAVENOUS

## 2017-06-06 MED ORDER — ONDANSETRON HCL 4 MG/2ML IJ SOLN
INTRAMUSCULAR | Status: AC
  Filled 2017-06-06: qty 2

## 2017-06-06 MED ORDER — DEXAMETHASONE SODIUM PHOSPHATE 10 MG/ML IJ SOLN
INTRAMUSCULAR | Status: DC | PRN
  Administered 2017-06-06: 5 mg via INTRAVENOUS

## 2017-06-06 MED ORDER — MIDAZOLAM HCL 5 MG/5ML IJ SOLN
INTRAMUSCULAR | Status: DC | PRN
  Administered 2017-06-06: 2 mg via INTRAVENOUS

## 2017-06-06 MED ORDER — FENTANYL CITRATE (PF) 100 MCG/2ML IJ SOLN
INTRAMUSCULAR | Status: DC | PRN
  Administered 2017-06-06: 100 ug via INTRAVENOUS
  Administered 2017-06-06: 50 ug via INTRAVENOUS

## 2017-06-06 MED ORDER — FENTANYL CITRATE (PF) 100 MCG/2ML IJ SOLN: 25.0000 ug | INTRAMUSCULAR | Status: DC | PRN

## 2017-06-06 MED ORDER — PROPOFOL 10 MG/ML IV BOLUS
INTRAVENOUS | Status: DC | PRN
  Administered 2017-06-06: 200 mg via INTRAVENOUS

## 2017-06-06 MED ORDER — ROCURONIUM BROMIDE 50 MG/5ML IV SOLN
INTRAVENOUS | Status: AC
  Filled 2017-06-06: qty 1

## 2017-06-06 MED ORDER — LIDOCAINE HCL (PF) 2 % IJ SOLN
INTRAMUSCULAR | Status: AC
  Filled 2017-06-06: qty 2

## 2017-06-06 MED ORDER — SODIUM CHLORIDE 0.9 % IV SOLN
INTRAVENOUS | Status: DC
  Administered 2017-06-06: 1000 mL via INTRAVENOUS

## 2017-06-06 MED ORDER — KETOROLAC TROMETHAMINE 30 MG/ML IJ SOLN
30.0000 mg | Freq: Once | INTRAMUSCULAR | Status: AC
  Administered 2017-06-06: 30 mg via INTRAVENOUS
  Filled 2017-06-06: qty 1

## 2017-06-06 MED ORDER — PROPOFOL 500 MG/50ML IV EMUL
INTRAVENOUS | Status: AC
Start: 2017-06-06 — End: 2017-06-06
  Filled 2017-06-06: qty 50

## 2017-06-06 MED ORDER — INDOMETHACIN 50 MG RE SUPP
RECTAL | Status: DC | PRN
  Administered 2017-06-06: 100 mg via RECTAL

## 2017-06-06 MED ORDER — SUCCINYLCHOLINE CHLORIDE 20 MG/ML IJ SOLN
INTRAMUSCULAR | Status: AC
  Filled 2017-06-06: qty 1

## 2017-06-06 MED ORDER — ROCURONIUM BROMIDE 100 MG/10ML IV SOLN
INTRAVENOUS | Status: DC | PRN
  Administered 2017-06-06: 10 mg via INTRAVENOUS
  Administered 2017-06-06: 5 mg via INTRAVENOUS

## 2017-06-06 MED ORDER — LIDOCAINE HCL (CARDIAC) 20 MG/ML IV SOLN
INTRAVENOUS | Status: DC | PRN
  Administered 2017-06-06: 60 mg via INTRAVENOUS

## 2017-06-06 MED ORDER — MIDAZOLAM HCL 2 MG/2ML IJ SOLN
INTRAMUSCULAR | Status: AC
  Filled 2017-06-06: qty 2

## 2017-06-06 MED ORDER — ONDANSETRON HCL 4 MG/2ML IJ SOLN: 4.0000 mg | Freq: Once | INTRAMUSCULAR | Status: DC | PRN

## 2017-06-06 NOTE — Op Note (Signed)
[facility] Gastroenterology Patient Name: Brenton Grillsshley Jaquith Procedure Date: 06/06/2017 11:51 AM MRN: [id number] Account #: [id number] Date of Birth: 06/09/1986 Admit Type: Inpatient Age: 31 [facility] ROOM 4 Gender: Female Note Status: Finalized Procedure:            ERCP Indications:          Jaundice Providers:            Midge Miniumarren Hula Tasso MD, MD Referring MD:         Sylvie FarrierNgwe A. Aycock MD (Referring MD) Medicines:            General Anesthesia Complications:        No immediate complications. Procedure:            Pre-Anesthesia Assessment:                       - Prior to the procedure, a History and Physical was                        performed, and patient medications and allergies were                        reviewed. The patient's tolerance of previous                        anesthesia was also reviewed. The risks and benefits of                        the procedure and the sedation options and risks were                        discussed with the patient. All questions were                        answered, and informed consent was obtained. Prior                        Anticoagulants: The patient has taken no previous                        anticoagulant or antiplatelet agents. ASA Grade                        Assessment: II - A patient with mild systemic disease.                        After reviewing the risks and benefits, the patient was                        deemed in satisfactory condition to undergo the                        procedure.                       After obtaining informed consent, the scope was passed                        under direct vision. Throughout the procedure, the  patient's blood pressure, pulse, and oxygen saturations                        were monitored continuously. The Endosonoscope was                        introduced through the mouth, and used to inject                        contrast into and used to  inject contrast into the bile                        duct. The ERCP was accomplished without difficulty. The                        patient tolerated the procedure well. Findings:      The scout film was normal. The esophagus was successfully intubated       under direct vision. The scope was advanced to a normal major papilla in       the descending duodenum without detailed examination of the pharynx,       larynx and associated structures, and upper GI tract. The upper GI tract       was grossly normal. The bile duct was deeply cannulated with the       short-nosed traction sphincterotome. Contrast was injected. I personally       interpreted the bile duct images. There was brisk flow of contrast       through the ducts. Image quality was excellent. Contrast extended to the       entire biliary tree. The lower third of the main bile duct contained a       single segmental stenosis 25 mm in length. A wire was passed into the       biliary tree. An 8 mm biliary sphincterotomy was made with a traction       (standard) sphincterotome using ERBE electrocautery. There was no       post-sphincterotomy bleeding. To discover objects, the biliary tree was       swept with a 15 mm balloon starting at the bifurcation. Sludge was swept       from the duct. Cells for cytology were obtained by brushing. One 10 Fr       by 7 cm plastic stent with a single external flap and a single internal       flap was placed 5 cm into the common bile duct. Bile flowed through the       stent. The stent was in good position. Impression:           - A distal CBD stricture was seen and brushed.                       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                       - A biliary sphincterotomy was performed.                       - The biliary tree was swept and sludge was found.                       -  One plastic stent was placed into the common bile                        duct. Recommendation:       -  Watch for pancreatitis, bleeding, perforation, and                        cholangitis.                       - Check CEA and Ca-19-9                       - Repeat ERCP in 2 months to remove stent. Procedure Code(s):    --- Professional ---                       [phone], Endoscopic retrograde cholangiopancreatography                        (ERCP); with placement of endoscopic stent into biliary                        or pancreatic duct, including pre- and post-dilation                        and guide wire passage, when performed, including                        sphincterotomy, when performed, each stent                       [id number], Endoscopic retrograde cholangiopancreatography                        (ERCP); with removal of calculi/debris from                        biliary/pancreatic duct(s)                       60454, Endoscopic catheterization of the biliary ductal                        system, radiological supervision and interpretation Diagnosis Code(s):    --- Professional ---                       R17, Unspecified jaundice                       K83.1, Obstruction of bile duct CPT copyright 2016 American Medical Association. All rights reserved. The codes documented in this report are preliminary and upon coder review may  be revised to meet current compliance requirements. Midge Minium MD, MD 06/06/2017 1:21:51 PM This report has been signed electronically. Number of Addenda: 0 Note Initiated On: 06/06/2017 11:51 AM      [facility]

## 2017-06-06 NOTE — Progress Notes (Signed)
CC: Back pain Subjective: This patient with choledocholithiasis. She is scheduled for an ERCP today by Dr. Servando Snare.  Today she describes ongoing back pain and some right upper quadrant pain with some nausea but no emesis.  Objective: Vital signs in last 24 hours: Temp:  [97.6 F (36.4 C)-99.6 F (37.6 C)] 99.3 F (37.4 C) (08/29 0816) Pulse Rate:  [84-98] 90 (08/29 0816) Resp:  [16-24] 18 (08/29 0816) BP: (120-147)/(70-86) 130/71 (08/29 0816) SpO2:  [96 %-100 %] 99 % (08/29 0816) Weight:  [277 lb (125.6 kg)] 277 lb (125.6 kg) (08/28 1337) Last BM Date: 06/05/17  Intake/Output from previous day: 08/28 0701 - 08/29 0700 In: 1787.5 [I.V.:1787.5] Out: 1100 [Urine:1100] Intake/Output this shift: No intake/output data recorded.  Physical exam:  Obese female patient no acute distress vital signs are stable and afebrile abdomen is soft there is tenderness in the right upper quadrant. Murphy sign is negative.  Calves are nontender  No jaundice  Lab Results: CBC   Recent Labs  06/05/17 1338 06/06/17 0517  WBC 8.7 11.8*  HGB 12.6 11.3*  HCT 37.8 33.8*  PLT 353 303   BMET  Recent Labs  06/05/17 1338 06/06/17 0517  NA 136 139  K 3.3* 3.0*  CL 106 107  CO2 20* 25  GLUCOSE 131* 131*  BUN <5* <5*  CREATININE 0.40* 0.32*  CALCIUM 9.1 8.7*   PT/INR No results for input(s): LABPROT, INR in the last 72 hours. ABG No results for input(s): PHART, HCO3 in the last 72 hours.  Invalid input(s): PCO2, PO2  Studies/Results: Ct Abdomen Pelvis W Contrast  Result Date: 06/05/2017 CLINICAL DATA:  Right flank pain for 1 week. Patient had incisional hernia repair with mesh on July 30th. EXAM: CT ABDOMEN AND PELVIS WITH CONTRAST TECHNIQUE: Multidetector CT imaging of the abdomen and pelvis was performed using the standard protocol following bolus administration of intravenous contrast. CONTRAST:  ISOVUE-300 IOPAMIDOL (ISOVUE-300) INJECTION 61% COMPARISON:  April 04, 2017  FINDINGS: Lower chest: There is mild atelectasis of the posterior lung bases. The heart size is normal. Hepatobiliary: No focal liver abnormality is seen. Contracted gallbladder with gallstones are identified unchanged. No biliary dilatation. Pancreas: Unremarkable. No pancreatic ductal dilatation or surrounding inflammatory changes. Spleen: Normal in size without focal abnormality. Adrenals/Urinary Tract: Adrenal glands are unremarkable. Kidneys are normal, without renal calculi, focal lesion, or hydronephrosis. Bladder is unremarkable. Stomach/Bowel: There is mild thick walled right colon with surrounding inflammation. There is no evidence of large or small bowel obstruction. Vascular/Lymphatic: No significant vascular findings are present. No enlarged abdominal or pelvic lymph nodes. Reproductive: Uterus and bilateral adnexa are unremarkable. Other: Patient status post prior hernia repair with anterior lower abdomen-in place. There is 5.7 x 2.4 x 5.4 cm fluid collection in the midline lower anterior abdominal/pelvic wall. There is also a 4.1 x 4.8 cm focal area fluid in the place of previous noted herniated fat of the anterior abdominal subcutaneous fat just to the left of midline. Musculoskeletal: No acute or significant osseous findings. IMPRESSION: Status post prior anterior abdominal wall hernia replacing with a 5.7 cm fluid collection in the place the repair. Abscess is not excluded. The previous space that was occupied herniated by fat in the anterior abdominal subcutaneous fat to left of midline is now replaced by an area fluid. Mild thick walled right colon with surrounding inflammation suggesting colitis. Electronically Signed   By: Sherian Rein M.D.   On: 06/05/2017 14:23   US Abdomen Limited Ruq  Result  Date: 06/05/2017 CLINICAL DATA:  Abdomen pain and vomiting for 2 days. EXAM: ULTRASOUND ABDOMEN LIMITED RIGHT UPPER QUADRANT COMPARISON:  None. FINDINGS: Gallbladder: Gallstones are identified in a  decompressed gallbladder. The ultrasound technologist reports no sonographic Murphy sign but the patient has been medicated. No significant pericholecystic fluid noted. Gallbladder wall measures 2.7 mm. Common bile duct: Diameter: Range from 4.4 mm to 9.6 mm. Liver: No focal lesion identified. Minimal diffuse increased echotexture of liver is identified. Portal vein is patent on color Doppler imaging with normal direction of blood flow towards the liver. IMPRESSION: Gallstones are identified in the decompressed bladder. The ultrasound technologist reports no sonographic Murphy sign. There is no prior cholecystic fluid. The gallbladder wall measures 2.7 mm. The findings are equivocal for cholecystitis. The common bowel duct range from 4.4 mm 9.6 mm, choledocolithiasis is not excluded. Electronically Signed   By: Sherian ReinWei-Chen  Lin M.D.   On: 06/05/2017 15:58    Anti-infectives: Anti-infectives    Start     Dose/Rate Route Frequency Ordered Stop   06/05/17 1800  Ampicillin-Sulbactam (UNASYN) 3 g in sodium chloride 0.9 % 100 mL IVPB     3 g 200 mL/hr over 30 Minutes Intravenous Every 6 hours 06/05/17 1754        Assessment/Plan:  Labs are personally reviewed. Ongoing elevated liver function tests and bilirubinemia. ERCP today with hopeful's component extraction.  Katherine Hawichard E Mrk Buzby, MD, FACS  06/06/2017

## 2017-06-06 NOTE — Anesthesia Procedure Notes (Signed)
Procedure Name: Intubation Date/Time: 06/06/2017 12:19 PM Performed by: Dionne Bucy Pre-anesthesia Checklist: Patient identified, Patient being monitored, Timeout performed, Emergency Drugs available and Suction available Patient Re-evaluated:Patient Re-evaluated prior to induction Oxygen Delivery Method: Circle system utilized Preoxygenation: Pre-oxygenation with 100% oxygen Induction Type: IV induction Ventilation: Mask ventilation without difficulty Laryngoscope Size: Mac and 3 Grade View: Grade II Tube type: Oral Tube size: 7.0 mm Number of attempts: 1 Airway Equipment and Method: Stylet Placement Confirmation: ETT inserted through vocal cords under direct vision,  positive ETCO2 and breath sounds checked- equal and bilateral Secured at: 22 cm Tube secured with: Tape Dental Injury: Teeth and Oropharynx as per pre-operative assessment

## 2017-06-06 NOTE — Progress Notes (Signed)
Patient c/o abdominal pain unrelieved by IV Dilaudid. Dr. Excell Seltzerooper paged for further intervention.

## 2017-06-06 NOTE — Brief Op Note (Signed)
Bile duct stricture brushing sent to lab

## 2017-06-06 NOTE — Transfer of Care (Addendum)
Immediate Anesthesia Transfer of Care Note  Patient: [name]  Procedure(s) Performed: Procedure(s): ENDOSCOPIC RETROGRADE CHOLANGIOPANCREATOGRAPHY (ERCP) WITH PROPOFOL (N/A)  Patient Location: PACU  Anesthesia Type:General  Level of Consciousness: sedated  Airway & Oxygen Therapy: Patient Spontanous Breathing and Patient connected to face mask oxygen  Post-op Assessment: Report given to RN and Post -op Vital signs reviewed and stable  Post vital signs: Reviewed and stable  Last Vitals:  Vitals:   06/06/17 1137 06/06/17 1316  BP: (!) 166/86 (!) 147/82  Pulse: (!) 106 (!) 106  Resp: (!) 24 (!) 32  Temp: (!) 38.8 C 36.4 C  SpO2: 98% 100%    Last Pain:  Vitals:   06/06/17 1137  TempSrc: Tympanic  PainSc: 10-Worst pain ever      Patients Stated Pain Goal: 0 (06/06/17 0917)  Complications: No apparent anesthesia complications

## 2017-06-06 NOTE — Anesthesia Post-op Follow-up Note (Signed)
Anesthesia QCDR form completed.        

## 2017-06-06 NOTE — Progress Notes (Signed)
Patient off floor to procedure.

## 2017-06-06 NOTE — Progress Notes (Signed)
Personally rev'd ERCP films with Dr Servando Snare

## 2017-06-06 NOTE — Progress Notes (Signed)
15 minute call to floor. 

## 2017-06-06 NOTE — Progress Notes (Signed)
Discussed with Dr. Servando SnareWohl.  Will await pathology report but patient feels much better this afternoon. She is hungry and feels as if her back pain is completely gone.  We'll likely be able to discharge her tomorrow on oral antibiotics if her pain is abated and she is able to tolerate a diet. We will see her as an outpatient to discuss pathology and further workup.

## 2017-06-06 NOTE — Anesthesia Preprocedure Evaluation (Signed)
Anesthesia Evaluation  Patient identified by MRN, date of birth, ID band Patient awake    Reviewed: Allergy & Precautions, NPO status , Patient's Chart, lab work & pertinent test results, reviewed documented beta blocker date and time   Airway Mallampati: III  TM Distance: >3 FB     Dental  (+) Chipped   Pulmonary Current Smoker,           Cardiovascular      Neuro/Psych PSYCHIATRIC DISORDERS Anxiety Depression    GI/Hepatic   Endo/Other  Morbid obesity  Renal/GU      Musculoskeletal   Abdominal   Peds  Hematology  (+) anemia ,   Anesthesia Other Findings K 3.0.  Reproductive/Obstetrics                             Anesthesia Physical Anesthesia Plan  ASA: III  Anesthesia Plan: General   Post-op Pain Management:    Induction: Intravenous  PONV Risk Score and Plan:   Airway Management Planned: Oral ETT  Additional Equipment:   Intra-op Plan:   Post-operative Plan:   Informed Consent: I have reviewed the patients History and Physical, chart, labs and discussed the procedure including the risks, benefits and alternatives for the proposed anesthesia with the patient or authorized representative who has indicated his/her understanding and acceptance.     Plan Discussed with: CRNA  Anesthesia Plan Comments:         Anesthesia Quick Evaluation

## 2017-06-06 NOTE — Progress Notes (Signed)
Pt complaining of increase in pain. Primary nurse notified Dr. Earlene Plateravis. Orders received for Toradol 30 mg IV once. Primary nurse to continue to monitor.

## 2017-06-07 ENCOUNTER — Telehealth: Payer: Self-pay

## 2017-06-07 ENCOUNTER — Encounter: Payer: Self-pay | Admitting: Gastroenterology

## 2017-06-07 LAB — COMPREHENSIVE METABOLIC PANEL
ALK PHOS: 160 U/L — AB (ref 38–126)
ALT: 202 U/L — ABNORMAL HIGH (ref 14–54)
AST: 65 U/L — AB (ref 15–41)
Albumin: 2.9 g/dL — ABNORMAL LOW (ref 3.5–5.0)
Anion gap: 7 (ref 5–15)
BILIRUBIN TOTAL: 2.9 mg/dL — AB (ref 0.3–1.2)
CO2: 24 mmol/L (ref 22–32)
CREATININE: 0.46 mg/dL (ref 0.44–1.00)
Calcium: 8.6 mg/dL — ABNORMAL LOW (ref 8.9–10.3)
Chloride: 108 mmol/L (ref 101–111)
GFR calc Af Amer: >60 mL/min (ref >60)
Glucose, Bld: 130 mg/dL — ABNORMAL HIGH (ref 65–99)
Potassium: 2.9 mmol/L — ABNORMAL LOW (ref 3.5–5.1)
Sodium: 139 mmol/L (ref 135–145)
TOTAL PROTEIN: 6.8 g/dL (ref 6.5–8.1)

## 2017-06-07 LAB — CBC WITH DIFFERENTIAL/PLATELET
BASOS ABS: 0 10*3/uL (ref 0–0.1)
Basophils Relative: 0 %
Eosinophils Absolute: 0.1 10*3/uL (ref 0–0.7)
Eosinophils Relative: 1 %
HEMATOCRIT: 30.7 % — AB (ref 35.0–47.0)
HEMOGLOBIN: 10.4 g/dL — AB (ref 12.0–16.0)
LYMPHS PCT: 15 %
Lymphs Abs: 1.7 10*3/uL (ref 1.0–3.6)
MCH: 26.6 pg (ref 26.0–34.0)
MCHC: 33.8 g/dL (ref 32.0–36.0)
MCV: 78.6 fL — AB (ref 80.0–100.0)
MONO ABS: 1 10*3/uL — AB (ref 0.2–0.9)
Monocytes Relative: 9 %
NEUTROS ABS: 8.5 10*3/uL — AB (ref 1.4–6.5)
Neutrophils Relative %: 75 %
Platelets: 288 10*3/uL (ref 150–440)
RBC: 3.91 MIL/uL (ref 3.80–5.20)
RDW: 15.3 % — ABNORMAL HIGH (ref 11.5–14.5)
WBC: 11.3 10*3/uL — AB (ref 3.6–11.0)

## 2017-06-07 LAB — CEA: CEA1: 4.2 ng/mL (ref 0.0–4.7)

## 2017-06-07 MED ORDER — AMOXICILLIN-POT CLAVULANATE 875-125 MG PO TABS: 1.0000 | ORAL_TABLET | Freq: Two times a day (BID) | ORAL | 1 refills | Status: DC

## 2017-06-07 MED ORDER — HYDROCODONE-ACETAMINOPHEN 5-300 MG PO TABS: 1.0000 | ORAL_TABLET | ORAL | 0 refills | Status: DC | PRN

## 2017-06-07 NOTE — Progress Notes (Signed)
Discharge instructions and prescriptions reviewed and given to patient. IV removed without complications. Patient discharged home with boyfriend via w/c and private vehicle.

## 2017-06-07 NOTE — Progress Notes (Signed)
CC: Bile duct obstruction Subjective: This a patient with a history of suspected choledocholithiasis who underwent an ERCP yesterday by Dr. Servando Snare. It showed a very unusual appearing distal common bile duct for which he placed a stent. Today she feels better has no back pain is completely resolved she feels well with no nausea or vomiting and no fevers or chills.  Objective: Vital signs in last 24 hours: Temp:  [98.3 F (36.8 C)-101.9 F (38.8 C)] 98.3 F (36.8 C) (08/30 0403) Pulse Rate:  [67-106] 67 (08/30 0403) Resp:  [18-24] 20 (08/30 0403) BP: (132-166)/(71-86) 132/76 (08/30 0403) SpO2:  [96 %-100 %] 99 % (08/30 0403) Last BM Date: 06/05/17  Intake/Output from previous day: 08/29 0701 - 08/30 0700 In: 800 [I.V.:800] Out: 1850 [Urine:1850] Intake/Output this shift: No intake/output data recorded.  Physical exam:  No icterus no jaundice abdomen is soft nontender no flank tenderness nontender calves morbidly obese vital signs reviewed and stable  Lab Results: CBC   Recent Labs  06/06/17 0517 06/07/17 0554  WBC 11.8* 11.3*  HGB 11.3* 10.4*  HCT 33.8* 30.7*  PLT 303 288   BMET  Recent Labs  06/06/17 0517 06/07/17 0554  NA 139 139  K 3.0* 2.9*  CL 107 108  CO2 25 24  GLUCOSE 131* 130*  BUN <5* <5*  CREATININE 0.32* 0.46  CALCIUM 8.7* 8.6*   PT/INR No results for input(s): LABPROT, INR in the last 72 hours. ABG No results for input(s): PHART, HCO3 in the last 72 hours.  Invalid input(s): PCO2, PO2  Studies/Results: Ct Abdomen Pelvis W Contrast  Result Date: 06/05/2017 CLINICAL DATA:  Right flank pain for 1 week. Patient had incisional hernia repair with mesh on July 30th. EXAM: CT ABDOMEN AND PELVIS WITH CONTRAST TECHNIQUE: Multidetector CT imaging of the abdomen and pelvis was performed using the standard protocol following bolus administration of intravenous contrast. CONTRAST:  ISOVUE-300 IOPAMIDOL (ISOVUE-300) INJECTION 61% COMPARISON:  April 04, 2017 FINDINGS: Lower chest: There is mild atelectasis of the posterior lung bases. The heart size is normal. Hepatobiliary: No focal liver abnormality is seen. Contracted gallbladder with gallstones are identified unchanged. No biliary dilatation. Pancreas: Unremarkable. No pancreatic ductal dilatation or surrounding inflammatory changes. Spleen: Normal in size without focal abnormality. Adrenals/Urinary Tract: Adrenal glands are unremarkable. Kidneys are normal, without renal calculi, focal lesion, or hydronephrosis. Bladder is unremarkable. Stomach/Bowel: There is mild thick walled right colon with surrounding inflammation. There is no evidence of large or small bowel obstruction. Vascular/Lymphatic: No significant vascular findings are present. No enlarged abdominal or pelvic lymph nodes. Reproductive: Uterus and bilateral adnexa are unremarkable. Other: Patient status post prior hernia repair with anterior lower abdomen-in place. There is 5.7 x 2.4 x 5.4 cm fluid collection in the midline lower anterior abdominal/pelvic wall. There is also a 4.1 x 4.8 cm focal area fluid in the place of previous noted herniated fat of the anterior abdominal subcutaneous fat just to the left of midline. Musculoskeletal: No acute or significant osseous findings. IMPRESSION: Status post prior anterior abdominal wall hernia replacing with a 5.7 cm fluid collection in the place the repair. Abscess is not excluded. The previous space that was occupied herniated by fat in the anterior abdominal subcutaneous fat to left of midline is now replaced by an area fluid. Mild thick walled right colon with surrounding inflammation suggesting colitis. Electronically Signed   By: Sherian Rein M.D.   On: 06/05/2017 14:23   Dg C-arm 1-60 Min-no Report  Result  Date: 06/06/2017 Fluoroscopy was utilized by the requesting physician.  No radiographic interpretation.   [place] Abdomen Limited Ruq  Result Date: 06/05/2017 CLINICAL DATA:  Abdomen pain  and vomiting for 2 days. EXAM: ULTRASOUND ABDOMEN LIMITED RIGHT UPPER QUADRANT COMPARISON:  None. FINDINGS: Gallbladder: Gallstones are identified in a decompressed gallbladder. The ultrasound technologist reports no sonographic Murphy sign but the patient has been medicated. No significant pericholecystic fluid noted. Gallbladder wall measures 2.7 mm. Common bile duct: Diameter: Range from 4.4 mm to 9.6 mm. Liver: No focal lesion identified. Minimal diffuse increased echotexture of liver is identified. Portal vein is patent on color Doppler imaging with normal direction of blood flow towards the liver. IMPRESSION: Gallstones are identified in the decompressed bladder. The ultrasound technologist reports no sonographic Murphy sign. There is no prior cholecystic fluid. The gallbladder wall measures 2.7 mm. The findings are equivocal for cholecystitis. The common bowel duct range from 4.4 mm 9.6 mm, choledocolithiasis is not excluded. Electronically Signed   By: Sherian ReinWei-Chen  Lin M.D.   On: 06/05/2017 15:58    Anti-infectives: Anti-infectives    Start     Dose/Rate Route Frequency Ordered Stop   06/05/17 1800  Ampicillin-Sulbactam (UNASYN) 3 g in sodium chloride 0.9 % 100 mL IVPB     3 g 200 mL/hr over 30 Minutes Intravenous Every 6 hours 06/05/17 1754        Assessment/Plan:  LFTs are improving pain has resolved no nausea vomiting will advance diet and discharged today I will see her next week and discuss the results of the pathology report as well as blood tests were ordered yesterday in the workup of this unusual distal common bile duct.  Lattie Hawichard E Vieno Tarrant, MD, FACS  06/07/2017

## 2017-06-07 NOTE — Anesthesia Postprocedure Evaluation (Signed)
Anesthesia Post Note  Patient: [name]  Procedure(s) Performed: Procedure(s) (LRB): ENDOSCOPIC RETROGRADE CHOLANGIOPANCREATOGRAPHY (ERCP) WITH PROPOFOL (N/A)  Patient location during evaluation: Endoscopy Anesthesia Type: General Level of consciousness: awake and alert Pain management: pain level controlled Vital Signs Assessment: post-procedure vital signs reviewed and stable Respiratory status: spontaneous breathing, nonlabored ventilation, respiratory function stable and patient connected to nasal cannula oxygen Cardiovascular status: blood pressure returned to baseline and stable Postop Assessment: no signs of nausea or vomiting Anesthetic complications: no     Last Vitals:  Vitals:   06/07/17 0955 06/07/17 1233  BP: 132/77 (!) 147/79  Pulse: 88 75  Resp: 18   Temp: 37 C 36.9 C  SpO2: 95% 99%    Last Pain:  Vitals:   06/07/17 1233  TempSrc: Oral  PainSc:                  [name]

## 2017-06-07 NOTE — Discharge Instructions (Signed)
Resume regular diet Take oral analgesics and antibiotics as prescribed Resume any home medications Return to the emergency room for any increased pain or fevers. Follow-up with Dr. Excell Seltzerooper next Thursday in the office in [place].

## 2017-06-07 NOTE — Telephone Encounter (Signed)
Patient called in and states that her pharmacy does not carry the Vicodin prescribed by Dr. Excell Seltzerooper today at discharge.  [facility]. Spoke with Pharmacy Tech and she explained that they did not have enough medication to fill prescription. Tech called to [facility] on [address] and asked patient to [facility]. [facility] at this time to have filled.

## 2017-06-07 NOTE — Discharge Summary (Signed)
Physician Discharge Summary  Patient ID: [name] MRN: [id number] DOB/AGE: 31/31/1987 31 y.o.  Admit date: 06/05/2017 Discharge date: 06/07/2017   Discharge Diagnoses:  Active Problems:   Choledocholithiasis   Jaundice   Obstruction of bile duct   Procedures:ERCP and stenting  Hospital Course: This patient admitted the hospital with laboratory findings suggestive of choledocholithiasis. Bactroban was consult it and he performed an ERCP with stenting but did not find any stones in the distal common bile duct. What he did find was a very unusual-appearing bile duct with a strip long strictures. I reviewed those photos with him. He took brushings which are pending in pathology and we drew tumor marker labs as well area and these are pending as well.  Patient's pain has resolved with stenting and her liver function tests are much improved. She will be discharged in stable condition today on oral antibiotics and oral analgesics as needed to follow-up in my office next week to discuss pathology report and laboratory values. She may require transfer to a higher level of care should further exploration be required.  Consults: Gastroenterology Dr. Servando SnareWohl  Disposition: 01-Home or Self Care   Allergies as of 06/07/2017   No Known Allergies     Medication List    TAKE these medications   amoxicillin-clavulanate 875-125 MG tablet Commonly known as:  AUGMENTIN Take 1 tablet by mouth 2 (two) times daily.   Hydrocodone-Acetaminophen 5-300 MG Tabs Commonly known as:  VICODIN Take 1 tablet by mouth every 4 (four) hours as needed.   ibuprofen 600 MG tablet Commonly known as:  ADVIL,MOTRIN Take 1 tablet (600 mg total) by mouth every 8 (eight) hours as needed for fever or mild pain.            Discharge Care Instructions        Start     Ordered   06/07/17 0000  Hydrocodone-Acetaminophen (VICODIN) 5-300 MG TABS  Every 4 hours PRN     06/07/17 0828   06/07/17 0000   amoxicillin-clavulanate (AUGMENTIN) 875-125 MG tablet  2 times daily     06/07/17 [id number]     Follow-up Information    Lattie Hawooper, Richard E, MD Follow up in 8 day(s).   Specialty:  Surgery Contact information: [address] [place] [address] [phone]           Lattie Hawichard E Cooper, MD, FACS

## 2017-06-07 NOTE — Progress Notes (Signed)
Midge Miniumarren Jania Steinke, MD [facility]   [address] [place], [place] [address] Phone: [phone] Fax : [phone]   Subjective: This patient is status post ERCP with a finding of a narrowing in the distal common bile duct. The patient had a stent placed at that time. She states that she has some hiccups but otherwise with out any pain or complaints.   Objective: Vital signs in last 24 hours: Vitals:   06/06/17 1600 06/06/17 2029 06/07/17 0403 06/07/17 0955  BP: 132/75 135/79 132/76 132/77  Pulse: 90 87 67 88  Resp: 18 19 20 18   Temp:  98.5 F (36.9 C) 98.3 F (36.8 C) 98.6 F (37 C)  TempSrc:  Oral Oral Oral  SpO2: 98% 99% 99% 95%  Weight:      Height:       Weight change:   Intake/Output Summary (Last 24 hours) at 06/07/17 1220 Last data filed at 06/07/17 0403  Gross per 24 hour  Intake              800 ml  Output             1850 ml  Net            -1050 ml     Exam: Heart:: Regular rate and rhythm, S1S2 present or without murmur or extra heart sounds Lungs: normal and clear to auscultation and percussion Abdomen: soft, nontender, normal bowel sounds   Lab Results: @LABTEST2 @ Micro Results: No results found for this or any previous visit (from the past 240 hour(s)). Studies/Results: Dg C-arm 1-60 Min-no Report  Result Date: 06/06/2017 Fluoroscopy was utilized by the requesting physician.  No radiographic interpretation.   [place] Abdomen Limited Ruq  Result Date: 06/05/2017 CLINICAL DATA:  Abdomen pain and vomiting for 2 days. EXAM: ULTRASOUND ABDOMEN LIMITED RIGHT UPPER QUADRANT COMPARISON:  None. FINDINGS: Gallbladder: Gallstones are identified in a decompressed gallbladder. The ultrasound technologist reports no sonographic Murphy sign but the patient has been medicated. No significant pericholecystic fluid noted. Gallbladder wall measures 2.7 mm. Common bile duct: Diameter: Range from 4.4 mm to 9.6 mm. Liver: No focal lesion identified. Minimal diffuse increased  echotexture of liver is identified. Portal vein is patent on color Doppler imaging with normal direction of blood flow towards the liver. IMPRESSION: Gallstones are identified in the decompressed bladder. The ultrasound technologist reports no sonographic Murphy sign. There is no prior cholecystic fluid. The gallbladder wall measures 2.7 mm. The findings are equivocal for cholecystitis. The common bowel duct range from 4.4 mm 9.6 mm, choledocolithiasis is not excluded. Electronically Signed   By: Sherian ReinWei-Chen  Lin M.D.   On: 06/05/2017 15:58   Medications: I have reviewed the patient's current medications. Scheduled Meds: . heparin  5,000 Units Subcutaneous Q8H  . indomethacin  100 mg Rectal Once  . pneumococcal 23 valent vaccine  0.5 mL Intramuscular Tomorrow-1000   Continuous Infusions: . ampicillin-sulbactam (UNASYN) IV Stopped (06/07/17 0654)  . dextrose 5% lactated ringers 150 mL/hr at 06/07/17 0531   PRN Meds:.HYDROmorphone (DILAUDID) injection, ondansetron **OR** ondansetron (ZOFRAN) IV   Assessment: Active Problems:   Choledocholithiasis   Jaundice   Obstruction of bile duct    Plan: This patient had an ERCP for a distal bile duct stricture. The patient had a normal CEA and pending CA 19-9. The patient will likely need an IgG4 also. The patient is supposed be discharged today and follow-up with surgery as an outpatient. She should also follow up with me for repeat  ERCP with stent removal in 2 months. The patient will also await the pathology results to see what our next steps may be.   LOS: 2 days   Midge Minium 06/07/2017, 12:20 PM

## 2017-06-08 LAB — CYTOLOGY - NON PAP

## 2017-06-10 LAB — CA 19-9 (SERIAL): CA 19-9: 31 U/mL (ref 0–35)

## 2017-06-13 ENCOUNTER — Telehealth: Payer: Self-pay

## 2017-06-13 DIAGNOSIS — R748 Abnormal levels of other serum enzymes: Secondary | ICD-10-CM

## 2017-06-13 NOTE — Telephone Encounter (Signed)
-----   Message from Midge Miniumarren Wohl, MD sent at 06/13/2017  9:05 AM EDT ----- Let the patient know that the brushings of the bile duct did not show any cancerous or precancerous cells. She is to have her stent removed in 2 months. She also needs to have her blood sent off for IgG4

## 2017-06-13 NOTE — Telephone Encounter (Signed)
LVM for pt to return my call.

## 2017-06-15 ENCOUNTER — Encounter: Payer: Self-pay | Admitting: Surgery

## 2017-06-15 NOTE — Telephone Encounter (Signed)
Left vm again for pt to return my call to schedule a repeat ERCP.

## 2017-06-20 ENCOUNTER — Other Ambulatory Visit: Payer: Self-pay

## 2017-06-20 ENCOUNTER — Telehealth: Payer: Self-pay | Admitting: General Practice

## 2017-06-20 DIAGNOSIS — R17 Unspecified jaundice: Secondary | ICD-10-CM

## 2017-06-20 NOTE — Telephone Encounter (Signed)
Contacted pt and scheduled repeat ERCP. She is scheduled for 08/07/17. I have mailed instructions and advised her Dr. Servando SnareWohl has added a new lab. Instructed her to go [facility] lab and have this done as soon as possible.

## 2017-06-20 NOTE — Telephone Encounter (Signed)
Left message for patient to call the office to r/s her no showed appointment on 06/15/17 for a post op: Laparoscopic Incisional Hernia Repair with mesh, extensive lysis of adhesions of more than 90 minutes 05/07/17 Dr. Aleen CampiPiscoya, please r/s if the patient calls back.

## 2017-07-03 ENCOUNTER — Encounter: Payer: Self-pay | Admitting: Surgery

## 2017-07-03 NOTE — Telephone Encounter (Signed)
Mailed letter to patient

## 2017-08-06 ENCOUNTER — Encounter: Payer: Self-pay | Admitting: *Deleted

## 2017-08-07 ENCOUNTER — Encounter: Payer: Self-pay | Admitting: *Deleted

## 2017-08-07 ENCOUNTER — Ambulatory Visit
Admission: RE | Admit: 2017-08-07 | Discharge: 2017-08-07 | Disposition: A | Discharge status: Home / Self Care | Payer: Medicaid Other | Source: Ambulatory Visit | Attending: Gastroenterology | Admitting: Gastroenterology

## 2017-08-07 ENCOUNTER — Encounter
Admission: RE | Disposition: A | Discharge status: Home / Self Care | Payer: Self-pay | Source: Ambulatory Visit | Attending: Gastroenterology

## 2017-08-07 DIAGNOSIS — K831 Obstruction of bile duct: Secondary | ICD-10-CM | POA: Insufficient documentation

## 2017-08-07 DIAGNOSIS — Z539 Procedure and treatment not carried out, unspecified reason: Secondary | ICD-10-CM | POA: Diagnosis not present

## 2017-08-07 DIAGNOSIS — R17 Unspecified jaundice: Secondary | ICD-10-CM | POA: Insufficient documentation

## 2017-08-07 SURGERY — ERCP, WITH INTERVENTION IF INDICATED
Anesthesia: General

## 2017-08-08 ENCOUNTER — Other Ambulatory Visit: Payer: Self-pay

## 2017-08-08 DIAGNOSIS — K831 Obstruction of bile duct: Secondary | ICD-10-CM

## 2017-08-14 ENCOUNTER — Encounter: Payer: Self-pay | Admitting: Anesthesiology

## 2017-08-14 ENCOUNTER — Ambulatory Visit: Admission: RE | Admit: 2017-08-14 | Payer: Medicaid Other | Source: Ambulatory Visit | Admitting: Gastroenterology

## 2017-08-14 ENCOUNTER — Encounter: Admission: RE | Payer: Self-pay | Source: Ambulatory Visit

## 2017-08-14 SURGERY — ERCP, WITH INTERVENTION IF INDICATED
Anesthesia: General

## 2017-09-07 ENCOUNTER — Other Ambulatory Visit: Payer: Self-pay

## 2017-09-07 DIAGNOSIS — Z4689 Encounter for fitting and adjustment of other specified devices: Secondary | ICD-10-CM

## 2017-09-11 ENCOUNTER — Encounter: Payer: Self-pay | Admitting: Registered Nurse

## 2017-09-11 ENCOUNTER — Ambulatory Visit: Admission: RE | Admit: 2017-09-11 | Payer: Medicaid Other | Source: Ambulatory Visit | Admitting: Gastroenterology

## 2017-09-11 ENCOUNTER — Encounter: Admission: RE | Payer: Self-pay | Source: Ambulatory Visit

## 2017-09-11 SURGERY — ERCP, WITH INTERVENTION IF INDICATED
Anesthesia: General

## 2017-09-13 ENCOUNTER — Telehealth: Payer: Self-pay

## 2017-09-13 NOTE — Telephone Encounter (Signed)
Left vm for pt to return my call to get rescheduled for ERCP. This will be the 3rd time rescheduling.

## 2017-10-15 ENCOUNTER — Other Ambulatory Visit: Payer: Self-pay

## 2017-10-15 DIAGNOSIS — Z4659 Encounter for fitting and adjustment of other gastrointestinal appliance and device: Secondary | ICD-10-CM

## 2017-10-30 ENCOUNTER — Ambulatory Visit: Payer: Medicaid Other | Admitting: Anesthesiology

## 2017-10-30 ENCOUNTER — Ambulatory Visit
Admission: RE | Admit: 2017-10-30 | Discharge: 2017-10-30 | Disposition: A | Discharge status: Home / Self Care | Payer: Medicaid Other | Source: Ambulatory Visit | Attending: Gastroenterology | Admitting: Gastroenterology

## 2017-10-30 ENCOUNTER — Encounter: Payer: Self-pay | Admitting: Anesthesiology

## 2017-10-30 ENCOUNTER — Ambulatory Visit: Payer: Medicaid Other

## 2017-10-30 ENCOUNTER — Encounter
Admission: RE | Disposition: A | Discharge status: Home / Self Care | Payer: Self-pay | Source: Ambulatory Visit | Attending: Gastroenterology

## 2017-10-30 DIAGNOSIS — Z833 Family history of diabetes mellitus: Secondary | ICD-10-CM | POA: Diagnosis not present

## 2017-10-30 DIAGNOSIS — Z825 Family history of asthma and other chronic lower respiratory diseases: Secondary | ICD-10-CM | POA: Insufficient documentation

## 2017-10-30 DIAGNOSIS — K8051 Calculus of bile duct without cholangitis or cholecystitis with obstruction: Secondary | ICD-10-CM

## 2017-10-30 DIAGNOSIS — K8050 Calculus of bile duct without cholangitis or cholecystitis without obstruction: Secondary | ICD-10-CM | POA: Diagnosis not present

## 2017-10-30 DIAGNOSIS — Z8249 Family history of ischemic heart disease and other diseases of the circulatory system: Secondary | ICD-10-CM | POA: Diagnosis not present

## 2017-10-30 DIAGNOSIS — F419 Anxiety disorder, unspecified: Secondary | ICD-10-CM | POA: Insufficient documentation

## 2017-10-30 DIAGNOSIS — D649 Anemia, unspecified: Secondary | ICD-10-CM | POA: Insufficient documentation

## 2017-10-30 DIAGNOSIS — Z4589 Encounter for adjustment and management of other implanted devices: Secondary | ICD-10-CM | POA: Diagnosis present

## 2017-10-30 DIAGNOSIS — Z4659 Encounter for fitting and adjustment of other gastrointestinal appliance and device: Secondary | ICD-10-CM | POA: Diagnosis not present

## 2017-10-30 DIAGNOSIS — F329 Major depressive disorder, single episode, unspecified: Secondary | ICD-10-CM | POA: Diagnosis not present

## 2017-10-30 DIAGNOSIS — F17210 Nicotine dependence, cigarettes, uncomplicated: Secondary | ICD-10-CM | POA: Insufficient documentation

## 2017-10-30 HISTORY — PX: ERCP: SHX5425

## 2017-10-30 LAB — POCT I-STAT 4, (NA,K, GLUC, HGB,HCT)
GLUCOSE: 107 mg/dL — AB (ref 65–99)
HEMATOCRIT: 41 % (ref 36.0–46.0)
Hemoglobin: 13.9 g/dL (ref 12.0–15.0)
Potassium: 3.7 mmol/L (ref 3.5–5.1)
Sodium: 138 mmol/L (ref 135–145)

## 2017-10-30 LAB — POCT PREGNANCY, URINE: PREG TEST UR: NEGATIVE

## 2017-10-30 SURGERY — ERCP, WITH INTERVENTION IF INDICATED
Anesthesia: General

## 2017-10-30 MED ORDER — SUCCINYLCHOLINE CHLORIDE 20 MG/ML IJ SOLN
INTRAMUSCULAR | Status: AC
  Filled 2017-10-30: qty 1

## 2017-10-30 MED ORDER — SODIUM CHLORIDE 0.9 % IV SOLN
INTRAVENOUS | Status: DC
  Administered 2017-10-30: 1000 mL via INTRAVENOUS

## 2017-10-30 MED ORDER — MIDAZOLAM HCL 2 MG/2ML IJ SOLN
INTRAMUSCULAR | Status: AC
  Filled 2017-10-30: qty 2

## 2017-10-30 MED ORDER — FENTANYL CITRATE (PF) 100 MCG/2ML IJ SOLN
INTRAMUSCULAR | Status: DC | PRN
  Administered 2017-10-30: 100 ug via INTRAVENOUS
  Administered 2017-10-30 (×2): 50 ug via INTRAVENOUS

## 2017-10-30 MED ORDER — FENTANYL CITRATE (PF) 100 MCG/2ML IJ SOLN: 25.0000 ug | INTRAMUSCULAR | Status: DC | PRN

## 2017-10-30 MED ORDER — FENTANYL CITRATE (PF) 100 MCG/2ML IJ SOLN
INTRAMUSCULAR | Status: AC
  Filled 2017-10-30: qty 2

## 2017-10-30 MED ORDER — DEXAMETHASONE SODIUM PHOSPHATE 10 MG/ML IJ SOLN
INTRAMUSCULAR | Status: DC | PRN
  Administered 2017-10-30: 10 mg via INTRAVENOUS

## 2017-10-30 MED ORDER — SUCCINYLCHOLINE CHLORIDE 20 MG/ML IJ SOLN
INTRAMUSCULAR | Status: DC | PRN
  Administered 2017-10-30: 100 mg via INTRAVENOUS

## 2017-10-30 MED ORDER — LIDOCAINE HCL (CARDIAC) 20 MG/ML IV SOLN
INTRAVENOUS | Status: DC | PRN
  Administered 2017-10-30: 100 mg via INTRAVENOUS

## 2017-10-30 MED ORDER — ONDANSETRON HCL 4 MG/2ML IJ SOLN: 4.0000 mg | Freq: Once | INTRAMUSCULAR | Status: DC | PRN

## 2017-10-30 MED ORDER — ONDANSETRON HCL 4 MG/2ML IJ SOLN
INTRAMUSCULAR | Status: AC
  Filled 2017-10-30: qty 2

## 2017-10-30 MED ORDER — DEXAMETHASONE SODIUM PHOSPHATE 10 MG/ML IJ SOLN
INTRAMUSCULAR | Status: AC
  Filled 2017-10-30: qty 1

## 2017-10-30 MED ORDER — LIDOCAINE HCL (PF) 2 % IJ SOLN
INTRAMUSCULAR | Status: AC
  Filled 2017-10-30: qty 10

## 2017-10-30 MED ORDER — PROPOFOL 10 MG/ML IV BOLUS
INTRAVENOUS | Status: AC
Start: 2017-10-30 — End: 2017-10-30
  Filled 2017-10-30: qty 20

## 2017-10-30 MED ORDER — MIDAZOLAM HCL 2 MG/2ML IJ SOLN
INTRAMUSCULAR | Status: DC | PRN
  Administered 2017-10-30: 2 mg via INTRAVENOUS

## 2017-10-30 MED ORDER — PROPOFOL 10 MG/ML IV BOLUS
INTRAVENOUS | Status: AC
  Filled 2017-10-30: qty 20

## 2017-10-30 MED ORDER — ONDANSETRON HCL 4 MG/2ML IJ SOLN
INTRAMUSCULAR | Status: DC | PRN
  Administered 2017-10-30: 4 mg via INTRAVENOUS

## 2017-10-30 MED ORDER — PROPOFOL 10 MG/ML IV BOLUS
INTRAVENOUS | Status: DC | PRN
  Administered 2017-10-30: 50 mg via INTRAVENOUS
  Administered 2017-10-30: 200 mg via INTRAVENOUS
  Administered 2017-10-30: 150 mg via INTRAVENOUS

## 2017-10-30 NOTE — Anesthesia Post-op Follow-up Note (Signed)
Anesthesia QCDR form completed.        

## 2017-10-30 NOTE — Anesthesia Postprocedure Evaluation (Signed)
Anesthesia Post Note  Patient: [name]  Procedure(s) Performed: ENDOSCOPIC RETROGRADE CHOLANGIOPANCREATOGRAPHY (ERCP) STENT REMOVAL (N/A )  Patient location during evaluation: PACU Anesthesia Type: General Level of consciousness: awake and alert and oriented Pain management: pain level controlled Vital Signs Assessment: post-procedure vital signs reviewed and stable Respiratory status: spontaneous breathing Cardiovascular status: blood pressure returned to baseline Anesthetic complications: no     Last Vitals:  Vitals:   10/30/17 1300 10/30/17 1315  BP: (!) 124/100 134/87  Pulse: 94 88  Resp: (!) 26 (!) 21  Temp:  (!) 36.2 C  SpO2: 98% 99%    Last Pain:  Vitals:   10/30/17 1300  TempSrc:   PainSc: 0-No pain                 Kiki Bivens

## 2017-10-30 NOTE — Transfer of Care (Signed)
Immediate Anesthesia Transfer of Care Note  Patient: [name]  Procedure(s) Performed: ENDOSCOPIC RETROGRADE CHOLANGIOPANCREATOGRAPHY (ERCP) STENT REMOVAL (N/A )  Patient Location: PACU  Anesthesia Type:General  Level of Consciousness: awake and alert   Airway & Oxygen Therapy: Patient Spontanous Breathing and Patient connected to face mask oxygen  Post-op Assessment: Report given to RN and Post -op Vital signs reviewed and stable  Post vital signs: Reviewed  Last Vitals:  Vitals:   10/30/17 1244 10/30/17 1245  BP: 115/81 115/81  Pulse: 97   Resp: 20 (!) 24  Temp: (!) 36.2 C (!) 36.2 C  SpO2: 100%     Last Pain:  Vitals:   10/30/17 1245  TempSrc:   PainSc: 0-No pain         Complications: No apparent anesthesia complications

## 2017-10-30 NOTE — Anesthesia Preprocedure Evaluation (Signed)
Anesthesia Evaluation  Patient identified by MRN, date of birth, ID band Patient awake    Reviewed: Allergy & Precautions, NPO status , Patient's Chart, lab work & pertinent test results, reviewed documented beta blocker date and time   Airway Mallampati: III  TM Distance: >3 FB     Dental  (+) Chipped   Pulmonary Current Smoker,    Pulmonary exam normal        Cardiovascular negative cardio ROS Normal cardiovascular exam     Neuro/Psych PSYCHIATRIC DISORDERS Anxiety Depression negative neurological ROS     GI/Hepatic   Endo/Other  Morbid obesity  Renal/GU      Musculoskeletal negative musculoskeletal ROS (+)   Abdominal Normal abdominal exam  (+)   Peds negative pediatric ROS (+)  Hematology  (+) anemia ,   Anesthesia Other Findings Past Medical History: 02/28/2016: Abdominal pain affecting pregnancy, antepartum No date: Anemia     Comment:  LAST HGB 04-03-17 12.5 No date: Anxiety No date: Depression 2018: Gallstones No date: Incisional ventral hernia w obstruction 2013-09-16: Obesity     Comment:  Last Assessment & Plan:  Obesity in pregnancy is               associated with maternal and obstetric complications               including increased risk of gestational DM, gestational               HTN, preeclampsia, thromboembolism, failed induction of               labor, Cesarean section, excessive gestational weight               gain and post-partum weight retention. Obesity is also               associated with fetal complications including               prematurity, stillbirth, congen 06/13/2016: Post-operative state Sep 16, 2013: SIDS (sudden infant death syndrome)     Comment:  Last Assessment & Plan:  Ms. Cantave third son died at              three months of age from SIDS. I reviewed with Ms.               Steinhart that the cause of SIDS is unknown. I did review               with her that placing  infants to sleep on their backs               without soft bedding and tobacco cessation are               potentially modifiable risk factors to decrease her risk               of SIDS. No date: Small bowel obstruction (HCC) 2013-09-16: Supervision of high risk pregnancy in third trimester     Comment:  Last Assessment & Plan:  Ms. Horsley is receiving her               [facility] at [facility] She reports having an               appointment tomorrow 12/3. I encouraged her to get a flu               and TDAP vaccination. She is due for her CBC, RPR, third  trimester HIV and glucola.  09/08/2013: Tobacco abuse     Comment:  Last Assessment & Plan:  I discussed with [name]               that the risks of smoking in pregnancy include               spontaneous pregnancy loss, placental abruption, preterm               premature rupture of membranes (PPROM), placenta previa,               preterm labor and delivery, and low birth weight (LBW) as              well as SIDS. I discussed with her that her tobacco use               is a modifiable risk factor for risk of placental               abruption and SIDS, b  Reproductive/Obstetrics negative OB ROS                             Anesthesia Physical  Anesthesia Plan  ASA: III  Anesthesia Plan: General   Post-op Pain Management:    Induction: Intravenous  PONV Risk Score and Plan:   Airway Management Planned: Oral ETT  Additional Equipment:   Intra-op Plan:   Post-operative Plan:   Informed Consent: I have reviewed the patients History and Physical, chart, labs and discussed the procedure including the risks, benefits and alternatives for the proposed anesthesia with the patient or authorized representative who has indicated his/her understanding and acceptance.     Plan Discussed with: CRNA  Anesthesia Plan Comments:         Anesthesia Quick Evaluation

## 2017-10-30 NOTE — Anesthesia Procedure Notes (Signed)
Procedure Name: Intubation Date/Time: 10/30/2017 11:35 AM Performed by: Dava NajjarFrazier, Autumn Pruitt, CRNA Pre-anesthesia Checklist: Patient identified, Emergency Drugs available, Suction available, Patient being monitored and Timeout performed Patient Re-evaluated:Patient Re-evaluated prior to induction Oxygen Delivery Method: Circle system utilized Preoxygenation: Pre-oxygenation with 100% oxygen Induction Type: IV induction Laryngoscope Size: Miller and 2 Grade View: Grade I Tube type: Oral Tube size: 7.0 mm Number of attempts: 1 Airway Equipment and Method: Stylet Placement Confirmation: ETT inserted through vocal cords under direct vision,  positive ETCO2 and breath sounds checked- equal and bilateral Secured at: 23 cm Tube secured with: Tape Dental Injury: Teeth and Oropharynx as per pre-operative assessment

## 2017-10-30 NOTE — H&P (Signed)
Midge Miniumarren Jeramine Delis, MD [facility] [address] [place], [place] [address] Phone:[phone] Fax : [phone]  Primary Care Physician:  [facility] Primary Gastroenterologist:  Dr. Servando SnareWohl  Pre-Procedure History & Physical: HPI:  [name] is a 32 y.o. female is here for an ERCP.   Past Medical History:  Diagnosis Date  . Abdominal pain affecting pregnancy, antepartum 02/28/2016  . Anemia    LAST HGB 04-03-17 12.5  . Anxiety   . Depression   . Gallstones 2018  . Incisional ventral hernia w obstruction   . Obesity 10/01/2013   Last Assessment & Plan:  Obesity in pregnancy is associated with maternal and obstetric complications including increased risk of gestational DM, gestational HTN, preeclampsia, thromboembolism, failed induction of labor, Cesarean section, excessive gestational weight gain and post-partum weight retention. Obesity is also associated with fetal complications including prematurity, stillbirth, congen  . Post-operative state 06/13/2016  . SIDS (sudden infant death syndrome) 09/15/2013   Last Assessment & Plan:  [name]'s third son died at three months of age from SIDS. I reviewed with [name] that the cause of SIDS is unknown. I did review with her that placing infants to sleep on their backs without soft bedding and tobacco cessation are potentially modifiable risk factors to decrease her risk of SIDS.  Marland Kitchen. Small bowel obstruction (HCC)   . Supervision of high risk pregnancy in third trimester 09/19/2013   Last Assessment & Plan:  [name] [facility] She reports having an appointment tomorrow 12/3. I encouraged her to get a flu and TDAP vaccination. She is due for her CBC, RPR, third trimester HIV and glucola.   . Tobacco abuse 10/01/2013   Last Assessment & Plan:  I discussed with [name] that the risks of smoking in pregnancy include spontaneous pregnancy loss, placental abruption, preterm premature  rupture of membranes (PPROM), placenta previa, preterm labor and delivery, and low birth weight (LBW) as well as SIDS. I discussed with her that her tobacco use is a modifiable risk factor for risk of placental abruption and SIDS, b    Past Surgical History:  Procedure Laterality Date  . CESAREAN SECTION N/A 06/12/2016   Procedure: STAT CESAREAN SECTION WITH BILATERAL STERILIZATION;  Surgeon: Suzy Bouchardhomas J Schermerhorn, MD;  Location: [facility] ORS;  Service: Obstetrics;  Laterality: N/A;  Baby Girl @ 2246 Weight 5 lb 10 oz Apgars 8/9  . ENDOSCOPIC RETROGRADE CHOLANGIOPANCREATOGRAPHY (ERCP) WITH PROPOFOL N/A 06/06/2017   Procedure: ENDOSCOPIC RETROGRADE CHOLANGIOPANCREATOGRAPHY (ERCP) WITH PROPOFOL;  Surgeon: Midge MiniumWohl, Mohsen Odenthal, MD;  Location: [facility] ENDOSCOPY;  Service: Endoscopy;  Laterality: N/A;  . INCISIONAL HERNIA REPAIR N/A 05/07/2017   Procedure: LAPAROSCOPIC INCISIONAL HERNIA;  Surgeon: Henrene DodgePiscoya, Jose, MD;  Location: [facility] ORS;  Service: General;  Laterality: N/A;  . TUBAL LIGATION    . VENTRAL HERNIA REPAIR N/A 06/19/2016   Procedure: HERNIA REPAIR VENTRAL ADULT;  Surgeon: Lattie Hawichard E Cooper, MD;  Location: [facility] ORS;  Service: General;  Laterality: N/A;    Prior to Admission medications   Medication Sig Start Date End Date Taking? Authorizing Provider  amoxicillin-clavulanate (AUGMENTIN) 875-125 MG tablet Take 1 tablet by mouth 2 (two) times daily. Patient not taking: Reported on 08/07/2017 06/07/17   Lattie Hawooper, Richard E, MD  Hydrocodone-Acetaminophen (VICODIN) 5-300 MG TABS Take 1 tablet by mouth every 4 (four) hours as needed. Patient not taking: Reported on 08/07/2017 06/07/17   Lattie Hawooper, Richard E, MD  ibuprofen (ADVIL,MOTRIN) 600 MG tablet Take  1 tablet (600 mg total) by mouth every 8 (eight) hours as needed for fever or mild pain. Patient not taking: Reported on 08/07/2017 05/08/17   Henrene Dodge, MD    Allergies as of 10/16/2017  . (No Known Allergies)    Family History  Problem Relation Age of  Onset  . Diabetes Mother   . Asthma Sister   . Hypertension Maternal Aunt   . Hypertension Maternal Uncle     Social History   Socioeconomic History  . Marital status: Single    Spouse name: Not on file  . Number of children: Not on file  . Years of education: Not on file  . Highest education level: Not on file  Social Needs  . Financial resource strain: Not on file  . Food insecurity - worry: Not on file  . Food insecurity - inability: Not on file  . Transportation needs - medical: Not on file  . Transportation needs - non-medical: Not on file  Occupational History  . Not on file  Tobacco Use  . Smoking status: Current Every Day Smoker    Packs/day: 1.00    Years: 10.00    Pack years: 10.00    Types: Cigarettes  . Smokeless tobacco: Never Used  Substance and Sexual Activity  . Alcohol use: Yes    Alcohol/week: 4.2 oz    Types: 7 Cans of beer per week    Comment: 2 40oz/day  . Drug use: No  . Sexual activity: No  Other Topics Concern  . Not on file  Social History Narrative  . Not on file    Review of Systems: See HPI, otherwise negative ROS  Physical Exam: There were no vitals taken for this visit. General:   Alert,  pleasant and cooperative in NAD Head:  Normocephalic and atraumatic. Neck:  Supple; no masses or thyromegaly. Lungs:  Clear throughout to auscultation.    Heart:  Regular rate and rhythm. Abdomen:  Soft, nontender and nondistended. Normal bowel sounds, without guarding, and without rebound.   Neurologic:  Alert and  oriented x4;  grossly normal neurologically.  Impression/Plan: [name] is here for an ERCP to be performed for stent removal  Risks, benefits, limitations, and alternatives regarding  ERCP have been reviewed with the patient.  Questions have been answered.  All parties agreeable.   Midge Minium, MD  10/30/2017, 10:24 AM

## 2017-10-30 NOTE — Op Note (Signed)
[facility]astroenterology Patient Name: [name] Procedure Date: 10/30/2017 11:28 AM MRN: [id number] Account #: [id number] Date of Birth: 10-Feb-1986 Admit Type: Outpatient Age: 32 Room: [facility] ENDO ROOM 4 Gender: Female Note Status: Finalized Procedure:            ERCP Indications:          Stent removal Providers:            Midge Minium MD, MD Referring MD:         No Local Md, MD (Referring MD) Medicines:            General Anesthesia Complications:        No immediate complications. Procedure:            Pre-Anesthesia Assessment:                       - Prior to the procedure, a History and Physical was                        performed, and patient medications and allergies were                        reviewed. The patient's tolerance of previous                        anesthesia was also reviewed. The risks and benefits of                        the procedure and the sedation options and risks were                        discussed with the patient. All questions were                        answered, and informed consent was obtained. Prior                        Anticoagulants: The patient has taken no previous                        anticoagulant or antiplatelet agents. ASA Grade                        Assessment: II - A patient with mild systemic disease.                        After reviewing the risks and benefits, the patient was                        deemed in satisfactory condition to undergo the                        procedure.                       After obtaining informed consent, the scope was passed                        under direct vision. Throughout the procedure, the  patient's blood pressure, pulse, and oxygen saturations                        were monitored continuously. The Endoscope was                        introduced through the mouth, and used to inject                        contrast into and used to  inject contrast into the bile                        duct. The ERCP was accomplished without difficulty. The                        patient tolerated the procedure well. Findings:      A biliary stent was visible on the scout film. One plastic stent       originating in the common bile duct was emerging from the major papilla.       One stent was removed from the common bile duct using a snare. A wire       was passed into the biliary tree. The biliary sphincterotomy was       extended with a traction (standard) sphincterotome using ERBE       electrocautery. There was no post-sphincterotomy bleeding. The bile duct       was deeply cannulated with the short-nosed traction sphincterotome.       Contrast was injected. I personally interpreted the bile duct images.       There was brisk flow of contrast through the ducts. Image quality was       excellent. Contrast extended to the entire biliary tree. The main bile       duct contained multiple stones mm. The biliary tree was swept with a 15       mm balloon starting at the bifurcation. Sludge was swept from the duct.       Many stones were removed. No stones remained. Impression:           - One stent from the common bile duct was seen in the                        major papilla.                       - Choledocholithiasis was found. Complete removal was                        accomplished by biliary sphincterotomy and balloon                        extraction.                       - One stent was removed from the common bile duct.                       - A biliary sphincterotomy was performed.                       - The biliary tree was swept. Recommendation:       -  Discharge patient to home.                       - Resume previous diet. Procedure Code(s):    --- Professional ---                       [phone], Endoscopic retrograde cholangiopancreatography                        (ERCP); with removal of foreign body(s) or stent(s)                         from biliary/pancreatic duct(s)                       43264, Endoscopic retrograde cholangiopancreatography                        (ERCP); with removal of calculi/debris from                        biliary/pancreatic duct(s)                       43262, Endoscopic retrograde cholangiopancreatography                        (ERCP); with sphincterotomy/papillotomy                       [phone], Endoscopic catheterization of the biliary ductal                        system, radiological supervision and interpretation Diagnosis Code(s):    --- Professional ---                       K80.50, Calculus of bile duct without cholangitis or                        cholecystitis without obstruction                       Z46.59, Encounter for fitting and adjustment of other                        gastrointestinal appliance and device CPT copyright 2016 [employer]. All rights reserved. The codes documented in this report are preliminary and upon coder review may  be revised to meet current compliance requirements. Midge Minium MD, MD 10/30/2017 12:33:27 PM This report has been signed electronically. Number of Addenda: 0 Note Initiated On: 10/30/2017 11:28 AM      [facility]

## 2017-10-31 ENCOUNTER — Encounter: Payer: Self-pay | Admitting: Gastroenterology

## 2018-01-10 ENCOUNTER — Telehealth: Payer: Self-pay | Admitting: Surgery

## 2018-01-10 NOTE — Telephone Encounter (Signed)
Patients calling said she was seen awhile back and had surgery done by Dr. Aleen CampiPiscoya, patient said when she had surgery done she said eventually said they would need to remove her gallbladder, patient complains of having some pain, pain being at the top of her ribs, pain level being a 8 at the highest,  But it comes and goes. Please call patient and advise, she can be reached at [phone].

## 2018-01-11 NOTE — Telephone Encounter (Signed)
Spoke with patient at this time.  She is continuing to have intermittently right upper quadrant pain and is interested in discussing surgery. Patient added to schedule 01/23/18 @ 2:30 pm with Dr.Cooper.

## 2018-01-23 ENCOUNTER — Ambulatory Visit: Payer: Self-pay | Admitting: Surgery

## 2018-01-30 ENCOUNTER — Ambulatory Visit: Payer: Self-pay | Admitting: Surgery

## 2018-02-07 ENCOUNTER — Encounter: Payer: Self-pay | Admitting: Surgery

## 2018-02-07 ENCOUNTER — Telehealth: Payer: Self-pay | Admitting: Surgery

## 2018-02-07 NOTE — Telephone Encounter (Signed)
Patient no showed appointment on 01/30/18 with Dr. Aleen Campi, I have mailed a letter for the patient to call the office and r/s no showed appointment.

## 2018-04-06 IMAGING — CT CT ABD-PELV W/ CM
2 of 5 series · 16 of 46 positions shown, 18 images · IV contrast (APPLIED)
Comparison: None.

CLINICAL DATA: C-section 4 days ago. Abdominal and back pain.
Vomiting 2 days.

EXAM:
CT ABDOMEN AND PELVIS WITH CONTRAST
TECHNIQUE: Multidetector CT imaging of the abdomen and pelvis was performed
using the standard protocol following bolus administration of
intravenous contrast.
CONTRAST:  100 mL Isovue 370 IV

[Series 2: axial st · axial · 0.81mm/px · z∈[-997,-577]mm · 13 of 98 slices shown, 15 images]
[im 7/98  soft-tissue]
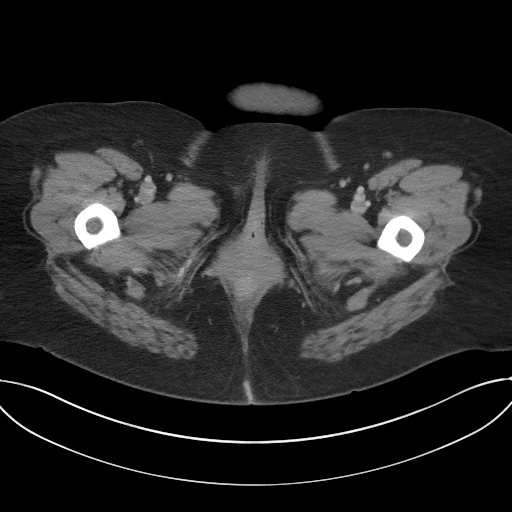
[im 7/98  bone]
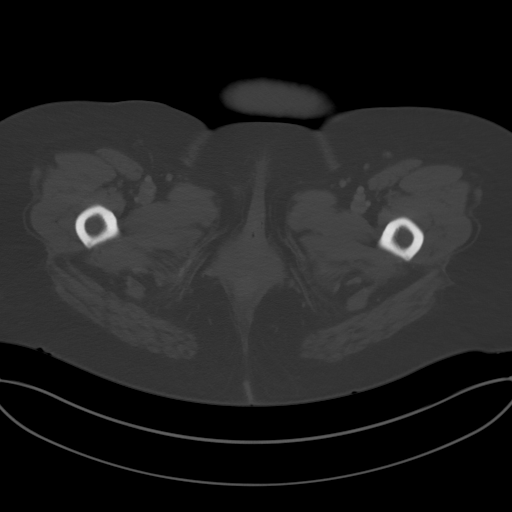
[im 13/98  soft-tissue]
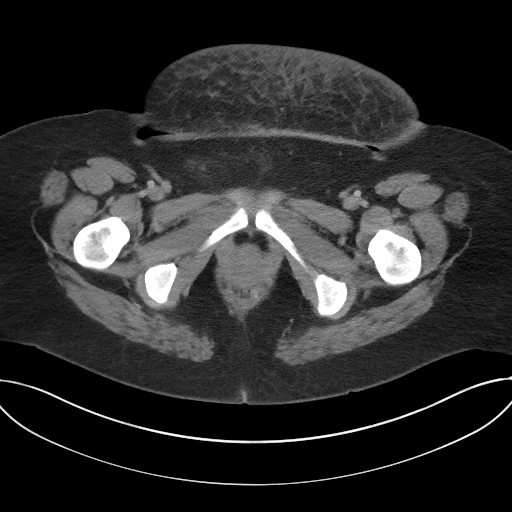
[im 20/98  soft-tissue]
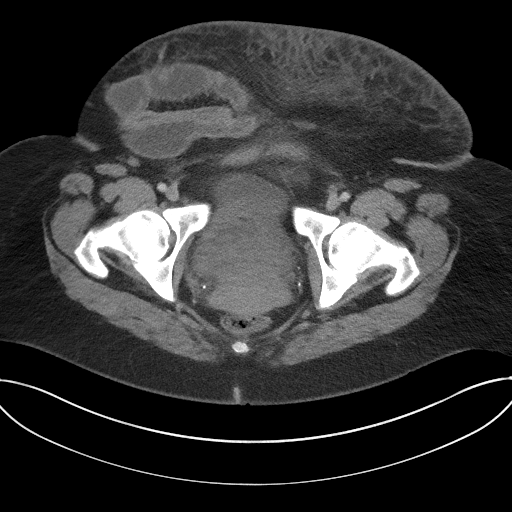
[im 26/98  soft-tissue]
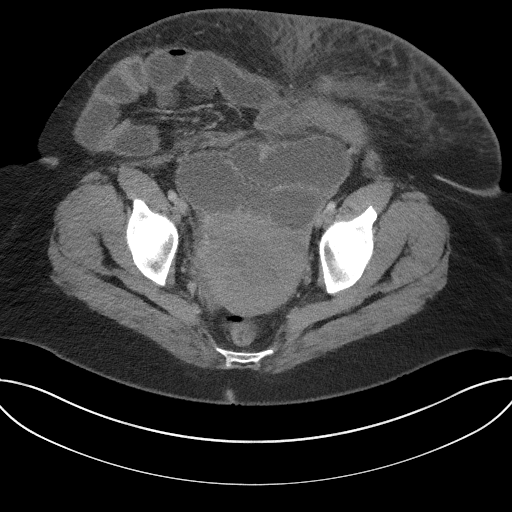
[im 33/98  soft-tissue]
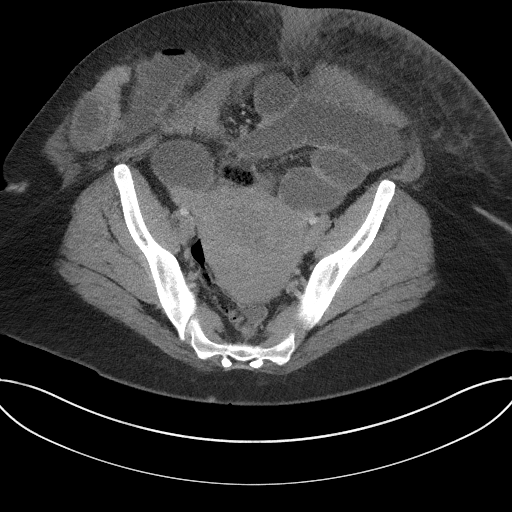
[im 39/98  soft-tissue]
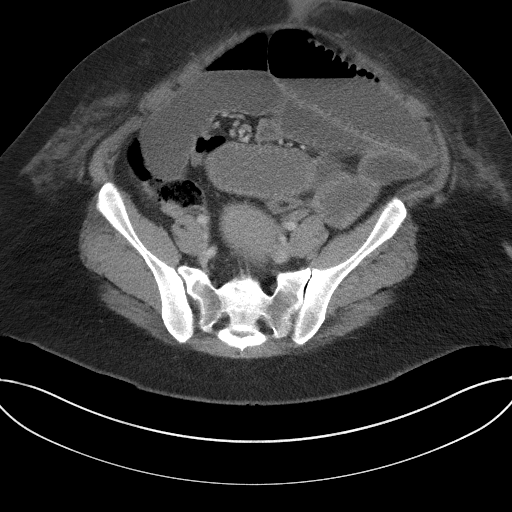
[im 52/98  soft-tissue]
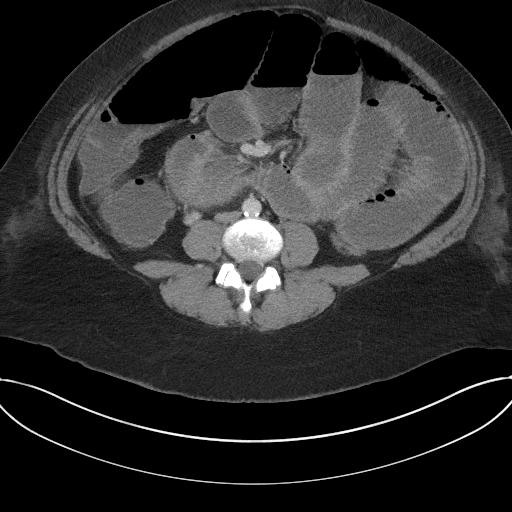
[im 59/98  soft-tissue]
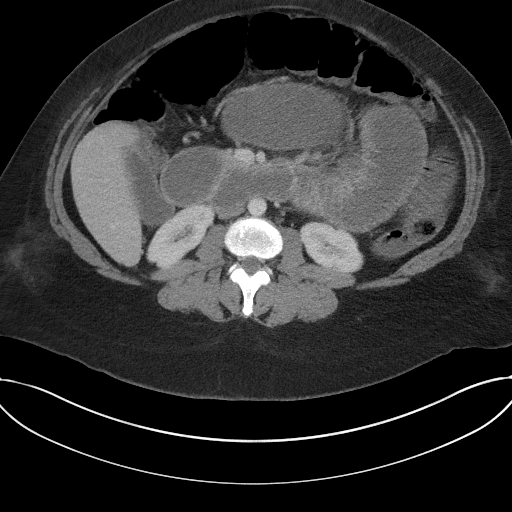
[im 65/98  soft-tissue]
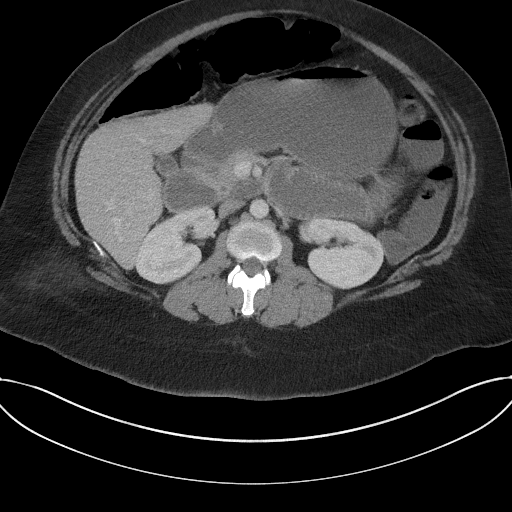
[im 65/98  bone]
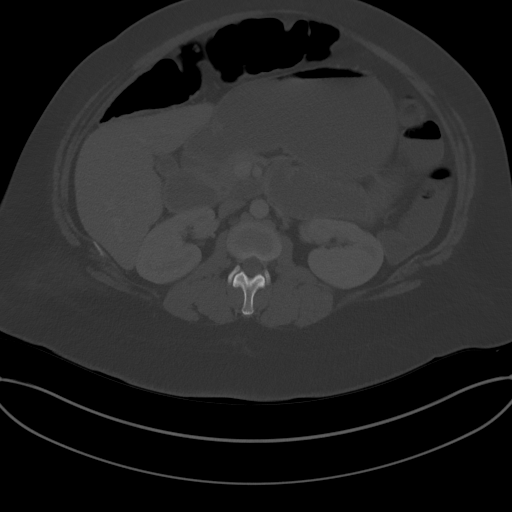
[im 72/98  soft-tissue]
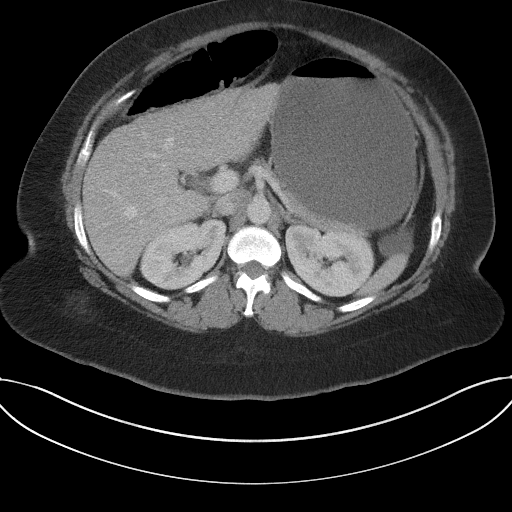
[im 78/98  soft-tissue]
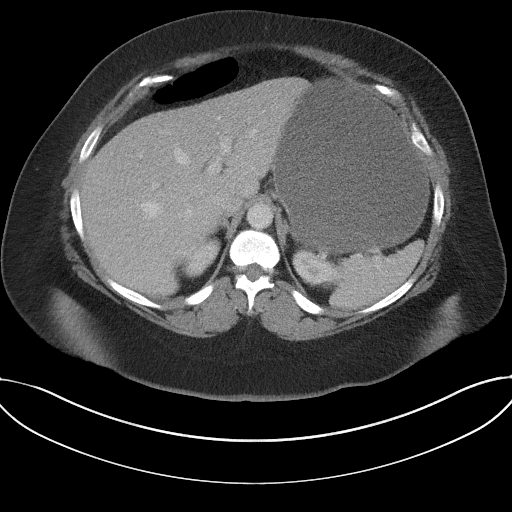
[im 85/98  soft-tissue]
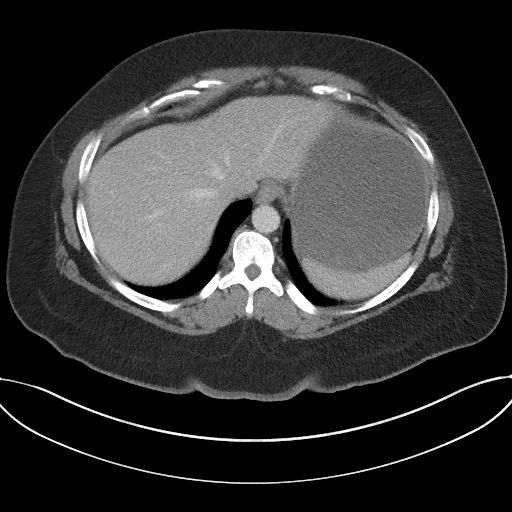
[im 91/98  soft-tissue]
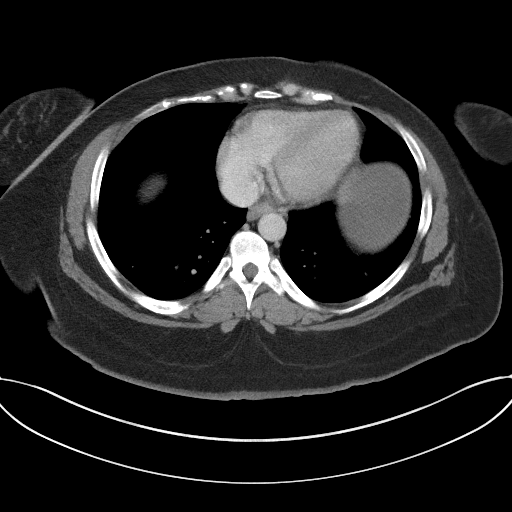

[Series 5: coronal st · coronal · 0.74mm/px · 3 of 107 slices shown]
[im 36/107  soft-tissue]
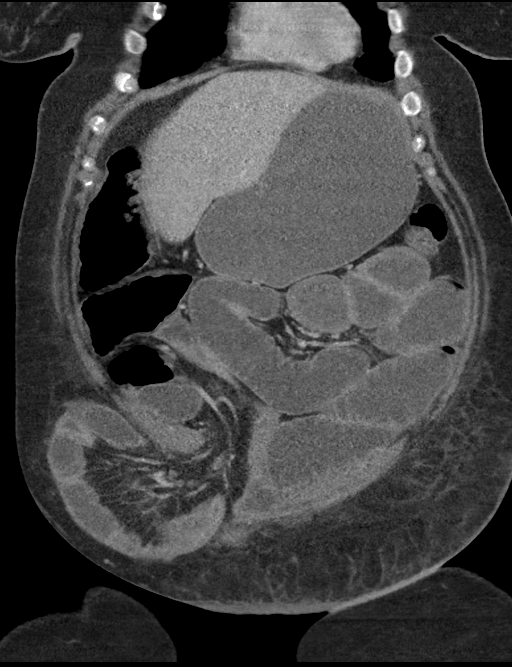
[im 48/107  soft-tissue]
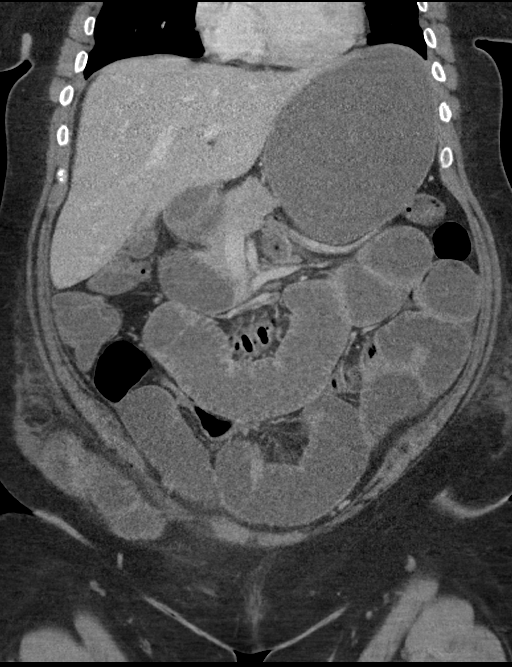
[im 59/107  soft-tissue]
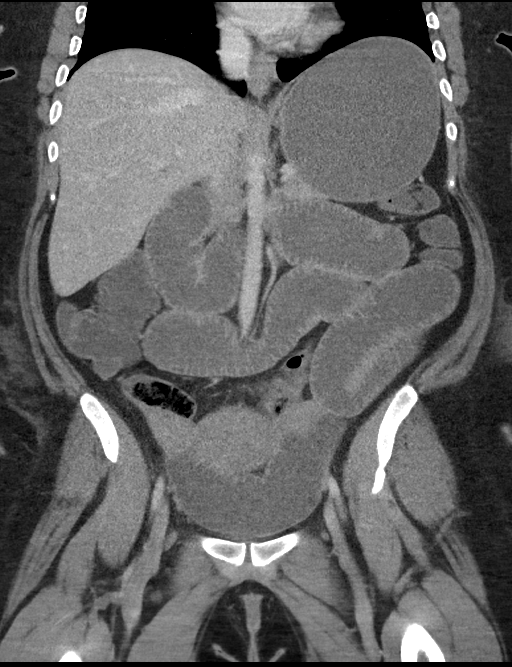

[16 of 46 positions shown; findings below may reference images not displayed]

FINDINGS: Lower chest: Within normal.

Hepatobiliary: Mild cholelithiasis. Liver and biliary tree are
within normal.

Pancreas: Within normal.

Spleen: Within normal.

Adrenals/Urinary Tract: Adrenal glands are normal and symmetric.
Kidneys an ureters are normal.

Stomach/Bowel: Mild gastric distention. There are multiple dilated
air and fluid-filled small bowel loops measuring up to 5.4 cm in
diameter. The dilated loops extend to a ventral hernia over the
right lower quadrant which contains several small bowel loops. There
is an abrupt caliber transition within the hernia sac likely the
site of obstruction. Mild air within the colon as the rectosigmoid
colon is decompressed.

Vascular/Lymphatic: Subtle calcified plaque at the aortic
bifurcation. No evidence of adenopathy.

Reproductive: Mild prominence of the uterus with ill-defined
endometrium compatible recent C-section. Ovaries are within normal.

Other: No significant free fluid. Subcutaneous edema over the lower
anterior abdominal wall and anterior pelvic wall.

Musculoskeletal: Within normal.
IMPRESSION: Evidence of distal small bowel obstruction with site of obstruction
within a moderate size ventral hernia sac over the right lower
quadrant.

Mild cholelithiasis.

## 2018-05-07 ENCOUNTER — Other Ambulatory Visit: Payer: Self-pay

## 2018-05-07 ENCOUNTER — Encounter: Payer: Self-pay | Admitting: Emergency Medicine

## 2018-05-07 ENCOUNTER — Emergency Department
Admission: EM | Admit: 2018-05-07 | Discharge: 2018-05-08 | Disposition: A | Discharge status: Home / Self Care | Payer: Medicaid Other | Attending: Emergency Medicine | Admitting: Emergency Medicine

## 2018-05-07 DIAGNOSIS — F17210 Nicotine dependence, cigarettes, uncomplicated: Secondary | ICD-10-CM | POA: Diagnosis not present

## 2018-05-07 DIAGNOSIS — N814 Uterovaginal prolapse, unspecified: Secondary | ICD-10-CM | POA: Insufficient documentation

## 2018-05-07 DIAGNOSIS — N898 Other specified noninflammatory disorders of vagina: Secondary | ICD-10-CM | POA: Diagnosis present

## 2018-05-07 DIAGNOSIS — Z79899 Other long term (current) drug therapy: Secondary | ICD-10-CM | POA: Diagnosis not present

## 2018-05-07 DIAGNOSIS — N760 Acute vaginitis: Secondary | ICD-10-CM | POA: Insufficient documentation

## 2018-05-07 DIAGNOSIS — B9689 Other specified bacterial agents as the cause of diseases classified elsewhere: Secondary | ICD-10-CM

## 2018-05-07 LAB — WET PREP, GENITAL
SPERM: NONE SEEN
Trich, Wet Prep: NONE SEEN
Yeast Wet Prep HPF POC: NONE SEEN

## 2018-05-07 LAB — POCT PREGNANCY, URINE: Preg Test, Ur: NEGATIVE

## 2018-05-07 LAB — URINALYSIS, COMPLETE (UACMP) WITH MICROSCOPIC
Bacteria, UA: NONE SEEN
Bilirubin Urine: NEGATIVE
GLUCOSE, UA: NEGATIVE mg/dL
KETONES UR: NEGATIVE mg/dL
Leukocytes, UA: NEGATIVE
NITRITE: NEGATIVE
PH: 6 (ref 5.0–8.0)
Protein, ur: NEGATIVE mg/dL
SQUAMOUS EPITHELIAL / LPF: NONE SEEN (ref 0–5)
Specific Gravity, Urine: 1.001 — ABNORMAL LOW (ref 1.005–1.030)

## 2018-05-07 NOTE — ED Provider Notes (Signed)
[facility] Emergency Department Provider Note   ____________________________________________   First MD Initiated Contact with Patient 05/07/18 2307     (approximate)  I have reviewed the triage vital signs and the nursing notes.   HISTORY  Chief Complaint Vaginal Discharge    HPI [name] is a 32 y.o. female who presents to the ED from home with a chief complaint of vaginal discharge, pelvic pressure and feeling "something coming out of my vagina".  Patient reports symptoms times today.  Last menstrual.  Approximately 1 month ago.  Has noticed some vaginal spotting.  States when she urinated and wiped she felt like something was coming out of her vagina.  Denies placing tampons that she only uses maxipads.  Last sexual intercourse 2 weeks ago.  Denies STD concern or exposure.  Denies recent fever, chills, chest pain, shortness of breath, abdominal pain, nausea, vomiting, dysuria.  Denies recent travel or trauma.   Past Medical History:  Diagnosis Date  . Abdominal pain affecting pregnancy, antepartum 02/28/2016  . Anemia    LAST HGB 04-03-17 12.5  . Anxiety   . Depression   . Gallstones 2018  . Incisional ventral hernia w obstruction   . Obesity 09/13/2013   Last Assessment & Plan:  Obesity in pregnancy is associated with maternal and obstetric complications including increased risk of gestational DM, gestational HTN, preeclampsia, thromboembolism, failed induction of labor, Cesarean section, excessive gestational weight gain and post-partum weight retention. Obesity is also associated with fetal complications including prematurity, stillbirth, congen  . Post-operative state 06/13/2016  . SIDS (sudden infant death syndrome) 10/06/2013   Last Assessment & Plan:  [name]'s third son died at three months of age from SIDS. I reviewed with [name] that the cause of SIDS is unknown. I did review with her that placing infants to sleep on their backs  without soft bedding and tobacco cessation are potentially modifiable risk factors to decrease her risk of SIDS.  Marland Kitchen. Small bowel obstruction (HCC)   . Supervision of high risk pregnancy in third trimester 10/03/2013   Last Assessment & Plan:  [name] [facility] She reports having an appointment tomorrow 12/3. I encouraged her to get a flu and TDAP vaccination. She is due for her CBC, RPR, third trimester HIV and glucola.   . Tobacco abuse 09/16/2013   Last Assessment & Plan:  I discussed with [name] that the risks of smoking in pregnancy include spontaneous pregnancy loss, placental abruption, preterm premature rupture of membranes (PPROM), placenta previa, preterm labor and delivery, and low birth weight (LBW) as well as SIDS. I discussed with her that her tobacco use is a modifiable risk factor for risk of placental abruption and SIDS, b    Patient Active Problem List   Diagnosis Date Noted  . Calculus of bile duct without cholecystitis with obstruction   . Encounter for fitting and adjustment of other gastrointestinal appliance and device   . Jaundice   . Obstruction of bile duct   . Choledocholithiasis 06/05/2017  . Incisional hernia 05/07/2017  . Incisional ventral hernia w obstruction   . Small bowel obstruction (HCC)   . Post-operative state 06/13/2016  . Abdominal pain affecting pregnancy, antepartum 02/28/2016  . IUFD (intrauterine fetal death) 2013/07/23  . Obesity 2013/07/23  . SIDS (sudden infant death syndrome) 2013/07/23  . Supervision of high risk pregnancy in third trimester 2013/07/23  . Tobacco abuse 2013/07/23  Past Surgical History:  Procedure Laterality Date  . CESAREAN SECTION N/A 06/12/2016   Procedure: STAT CESAREAN SECTION WITH BILATERAL STERILIZATION;  Surgeon: Suzy Bouchard, MD;  Location: [facility] ORS;  Service: Obstetrics;  Laterality: N/A;  Baby Girl @ 2246 Weight 5 lb 10 oz Apgars 8/9  . ENDOSCOPIC RETROGRADE  CHOLANGIOPANCREATOGRAPHY (ERCP) WITH PROPOFOL N/A 06/06/2017   Procedure: ENDOSCOPIC RETROGRADE CHOLANGIOPANCREATOGRAPHY (ERCP) WITH PROPOFOL;  Surgeon: Midge Minium, MD;  Location: [facility] ENDOSCOPY;  Service: Endoscopy;  Laterality: N/A;  . ERCP N/A 10/30/2017   Procedure: ENDOSCOPIC RETROGRADE CHOLANGIOPANCREATOGRAPHY (ERCP) STENT REMOVAL;  Surgeon: Midge Minium, MD;  Location: [facility] ENDOSCOPY;  Service: Endoscopy;  Laterality: N/A;  . INCISIONAL HERNIA REPAIR N/A 05/07/2017   Procedure: LAPAROSCOPIC INCISIONAL HERNIA;  Surgeon: Henrene Dodge, MD;  Location: [facility] ORS;  Service: General;  Laterality: N/A;  . TUBAL LIGATION    . VENTRAL HERNIA REPAIR N/A 06/19/2016   Procedure: HERNIA REPAIR VENTRAL ADULT;  Surgeon: Lattie Haw, MD;  Location: [facility] ORS;  Service: General;  Laterality: N/A;    Prior to Admission medications   Medication Sig Start Date End Date Taking? Authorizing Provider  amoxicillin-clavulanate (AUGMENTIN) 875-125 MG tablet Take 1 tablet by mouth 2 (two) times daily. Patient not taking: Reported on 08/07/2017 06/07/17   Lattie Haw, MD  Hydrocodone-Acetaminophen (VICODIN) 5-300 MG TABS Take 1 tablet by mouth every 4 (four) hours as needed. Patient not taking: Reported on 08/07/2017 06/07/17   Lattie Haw, MD  ibuprofen (ADVIL,MOTRIN) 600 MG tablet Take 1 tablet (600 mg total) by mouth every 8 (eight) hours as needed for fever or mild pain. Patient not taking: Reported on 08/07/2017 05/08/17   Henrene Dodge, MD  metroNIDAZOLE (FLAGYL) 500 MG tablet Take 1 tablet (500 mg total) by mouth 2 (two) times daily. 05/08/18   Irean Hong, MD    Allergies Amoxicillin and Vicodin [hydrocodone-acetaminophen]  Family History  Problem Relation Age of Onset  . Diabetes Mother   . Asthma Sister   . Hypertension Maternal Aunt   . Hypertension Maternal Uncle     Social History Social History   Tobacco Use  . Smoking status: Current Every Day Smoker    Packs/day: 1.00     Years: 10.00    Pack years: 10.00    Types: Cigarettes  . Smokeless tobacco: Never Used  Substance Use Topics  . Alcohol use: Yes    Alcohol/week: 4.2 oz    Types: 7 Cans of beer per week    Comment: 2 40oz/day  . Drug use: No    Review of Systems  Constitutional: No fever/chills Eyes: No visual changes. ENT: No sore throat. Cardiovascular: Denies chest pain. Respiratory: Denies shortness of breath. Gastrointestinal: No abdominal pain.  No nausea, no vomiting.  No diarrhea.  No constipation. Genitourinary: Positive for vaginal discharge, "something coming out of the vagina".  Negative for dysuria. Musculoskeletal: Negative for back pain. Skin: Negative for rash. Neurological: Negative for headaches, focal weakness or numbness.   ____________________________________________   PHYSICAL EXAM:  VITAL SIGNS: ED Triage Vitals  Enc Vitals Group     BP 05/07/18 2223 118/65     Pulse Rate 05/07/18 2223 100     Resp 05/07/18 2223 18     Temp 05/07/18 2223 98.4 F (36.9 C)     Temp Source 05/07/18 2223 Oral     SpO2 05/07/18 2223 97 %     Weight 05/07/18 2222 287 lb (130.2 kg)     Height 05/07/18  2222 5\' 4"  (1.626 m)     Head Circumference --      Peak Flow --      Pain Score 05/07/18 2222 0     Pain Loc --      Pain Edu? --      Excl. in [place]? --     Constitutional: Alert and oriented. Well appearing and in no acute distress. Eyes: Conjunctivae are normal. PERRL. EOMI. Head: Atraumatic. Nose: No congestion/rhinnorhea. Mouth/Throat: Mucous membranes are moist.  Oropharynx non-erythematous. Neck: No stridor.   Cardiovascular: Normal rate, regular rhythm. Grossly normal heart sounds.  Good peripheral circulation. Respiratory: Normal respiratory effort.  No retractions. Lungs CTAB. Gastrointestinal: Soft and nontender to light or deep palpation. No distention. No abdominal bruits. No CVA tenderness. Musculoskeletal: No lower extremity tenderness nor edema.  No joint  effusions. Neurologic:  Normal speech and language. No gross focal neurologic deficits are appreciated. No gait instability. Skin:  Skin is warm, dry and intact. No rash noted. Psychiatric: Mood and affect are normal. Speech and behavior are normal.  ____________________________________________   LABS (all labs ordered are listed, but only abnormal results are displayed)  Labs Reviewed  WET PREP, GENITAL - Abnormal; Notable for the following components:      Result Value   Clue Cells Wet Prep HPF POC PRESENT (*)    WBC, Wet Prep HPF POC MODERATE (*)    All other components within normal limits  URINALYSIS, COMPLETE (UACMP) WITH MICROSCOPIC - Abnormal; Notable for the following components:   Color, Urine COLORLESS (*)    APPearance CLEAR (*)    Specific Gravity, Urine 1.001 (*)    Hgb urine dipstick SMALL (*)    All other components within normal limits  CHLAMYDIA/NGC RT PCR ([facility] ONLY)  POCT PREGNANCY, URINE   ____________________________________________  EKG  None ____________________________________________  RADIOLOGY  ED MD interpretation: None  Official radiology report(s): No results found.  ____________________________________________   PROCEDURES  Procedure(s) performed:   Pelvic exam: External exam within normal limits without rashes, lesions or vesicles.  Speculum exam reveals trace vaginal bleeding without clots.  Cervical os is closed.  Bimanual exam reveals no adnexal tenderness.  On patient bearing down, uterine floor felt on palpation.  Procedures  Critical Care performed: No  ____________________________________________   INITIAL IMPRESSION / ASSESSMENT AND PLAN / ED COURSE  As part of my medical decision making, I reviewed the following data within the electronic MEDICAL RECORD NUMBER Nursing notes reviewed and incorporated, Labs reviewed, Old chart reviewed and Notes from prior ED visits   32 year old female who presents with vaginal  discharge, pelvic pressure and "something coming out of my vagina". Differential diagnosis includes, but is not limited to, ovarian cyst, ovarian torsion, acute appendicitis, diverticulitis, urinary tract infection/pyelonephritis, endometriosis, bowel obstruction, colitis, renal colic, gastroenteritis, hernia, fibroids, endometriosis, pregnancy related pain including ectopic pregnancy, etc.  UPT and urinalysis negative.  On exam patient has very mild uterine prolapse.  Awaiting results of wet prep.  Advised patient to start [place] exercises and will refer her to GYN for outpatient follow-up.  Clinical Course as of May 08 40  Wed May 08, 2018  0010 Updated patient on wet prep results.  Will discharge home with prescription for Flagyl and patient will follow-up with GYN.  Strict return precautions given.  Patient verbalizes understanding and agrees with plan of care.   [JS]    Clinical Course User Index [JS] Irean Hong, MD     ____________________________________________  FINAL CLINICAL IMPRESSION(S) / ED DIAGNOSES  Final diagnoses:  Uterine prolapse  BV (bacterial vaginosis)     ED Discharge Orders        Ordered    metroNIDAZOLE (FLAGYL) 500 MG tablet  2 times daily     05/08/18 0011       Note:  This document was prepared using Dragon voice recognition software and may include unintentional dictation errors.    Irean Hong, MD 05/08/18 [phone]

## 2018-05-07 NOTE — ED Triage Notes (Addendum)
Patient ambulatory to triage with steady gait, without difficulty or distress noted; pt reports having clear vag discharge, pelvic pressure, "something coming out of vagina"; denies pain

## 2018-05-07 NOTE — ED Notes (Signed)
Pt stated that she has been having a lot of pressure when she has to urinate and she feels like something is in her vagina but she is unsure what it could be. Pt denies any pain. Pt stated that she had her tubes tied in Septmeber 4,2017.

## 2018-05-07 NOTE — ED Notes (Signed)
In to assist with pelvic exam

## 2018-05-08 LAB — CHLAMYDIA/NGC RT PCR (ARMC ONLY)
Chlamydia Tr: NOT DETECTED
N gonorrhoeae: NOT DETECTED

## 2018-05-08 MED ORDER — METRONIDAZOLE 500 MG PO TABS: 500.0000 mg | ORAL_TABLET | Freq: Two times a day (BID) | ORAL | 0 refills | Status: DC

## 2018-05-08 NOTE — Discharge Instructions (Addendum)
1.  Start antibiotic as prescribed (Flagyl 500 mg twice daily x7 days). 2.  Return to the ER for worsening symptoms, persistent vomiting, difficulty breathing or other concerns.

## 2018-05-17 ENCOUNTER — Ambulatory Visit
Admission: RE | Admit: 2018-05-17 | Discharge: 2018-05-17 | Disposition: A | Discharge status: Home / Self Care | Payer: Disability Insurance | Source: Ambulatory Visit | Attending: Ophthalmology | Admitting: Ophthalmology

## 2018-05-17 ENCOUNTER — Other Ambulatory Visit: Payer: Self-pay | Admitting: Ophthalmology

## 2018-05-17 DIAGNOSIS — M7989 Other specified soft tissue disorders: Secondary | ICD-10-CM

## 2018-05-17 DIAGNOSIS — M545 Low back pain: Secondary | ICD-10-CM

## 2018-05-17 DIAGNOSIS — M2392 Unspecified internal derangement of left knee: Secondary | ICD-10-CM | POA: Diagnosis not present

## 2019-05-30 ENCOUNTER — Emergency Department
Admission: EM | Admit: 2019-05-30 | Discharge: 2019-05-30 | Disposition: A | Discharge status: Home / Self Care | Payer: Medicaid Other | Attending: Student | Admitting: Student

## 2019-05-30 ENCOUNTER — Other Ambulatory Visit: Payer: Self-pay

## 2019-05-30 DIAGNOSIS — N390 Urinary tract infection, site not specified: Secondary | ICD-10-CM | POA: Insufficient documentation

## 2019-05-30 DIAGNOSIS — F17210 Nicotine dependence, cigarettes, uncomplicated: Secondary | ICD-10-CM | POA: Diagnosis not present

## 2019-05-30 DIAGNOSIS — R109 Unspecified abdominal pain: Secondary | ICD-10-CM | POA: Diagnosis present

## 2019-05-30 LAB — COMPREHENSIVE METABOLIC PANEL
ALT: 49 U/L — ABNORMAL HIGH (ref 0–44)
AST: 57 U/L — ABNORMAL HIGH (ref 15–41)
Albumin: 3.8 g/dL (ref 3.5–5.0)
Alkaline Phosphatase: 69 U/L (ref 38–126)
Anion gap: 7 (ref 5–15)
BUN: 6 mg/dL (ref 6–20)
CO2: 25 mmol/L (ref 22–32)
Calcium: 9.2 mg/dL (ref 8.9–10.3)
Chloride: 104 mmol/L (ref 98–111)
Creatinine, Ser: 0.45 mg/dL (ref 0.44–1.00)
GFR calc Af Amer: >60 mL/min (ref >60)
GFR calc non Af Amer: >60 mL/min (ref >60)
Glucose, Bld: 95 mg/dL (ref 70–99)
Potassium: 3.4 mmol/L — ABNORMAL LOW (ref 3.5–5.1)
Sodium: 136 mmol/L (ref 135–145)
Total Bilirubin: 0.5 mg/dL (ref 0.3–1.2)
Total Protein: 7.6 g/dL (ref 6.5–8.1)

## 2019-05-30 LAB — URINALYSIS, COMPLETE (UACMP) WITH MICROSCOPIC
Bacteria, UA: NONE SEEN
Glucose, UA: NEGATIVE mg/dL
Ketones, ur: NEGATIVE mg/dL
Nitrite: NEGATIVE
Protein, ur: 30 mg/dL — AB
Specific Gravity, Urine: 1.024 (ref 1.005–1.030)
WBC, UA: >50 WBC/hpf — ABNORMAL HIGH (ref 0–5)
pH: 6 (ref 5.0–8.0)

## 2019-05-30 LAB — POCT PREGNANCY, URINE: Preg Test, Ur: NEGATIVE

## 2019-05-30 LAB — CBC
HCT: 39.3 % (ref 36.0–46.0)
Hemoglobin: 13.1 g/dL (ref 12.0–15.0)
MCH: 28.2 pg (ref 26.0–34.0)
MCHC: 33.3 g/dL (ref 30.0–36.0)
MCV: 84.7 fL (ref 80.0–100.0)
Platelets: 330 10*3/uL (ref 150–400)
RBC: 4.64 MIL/uL (ref 3.87–5.11)
RDW: 14 % (ref 11.5–15.5)
WBC: 8.2 10*3/uL (ref 4.0–10.5)
nRBC: 0 % (ref 0.0–0.2)

## 2019-05-30 LAB — WET PREP, GENITAL
Clue Cells Wet Prep HPF POC: NONE SEEN
Sperm: NONE SEEN
Trich, Wet Prep: NONE SEEN
Yeast Wet Prep HPF POC: NONE SEEN

## 2019-05-30 LAB — HCG, QUANTITATIVE, PREGNANCY: hCG, Beta Chain, Quant, S: 1 m[IU]/mL (ref <5)

## 2019-05-30 LAB — LIPASE, BLOOD: Lipase: 26 U/L (ref 11–51)

## 2019-05-30 MED ORDER — SODIUM CHLORIDE 0.9% FLUSH: 3.0000 mL | Freq: Once | INTRAVENOUS | Status: DC

## 2019-05-30 MED ORDER — SULFAMETHOXAZOLE-TRIMETHOPRIM 800-160 MG PO TABS: 1.0000 | ORAL_TABLET | Freq: Two times a day (BID) | ORAL | 0 refills | Status: DC

## 2019-05-30 NOTE — ED Provider Notes (Signed)
[facility] Emergency Department Provider Note   ____________________________________________   First MD Initiated Contact with Patient 05/30/19 1216     (approximate)  I have reviewed the triage vital signs and the nursing notes.   HISTORY  Chief Complaint No chief complaint on file.   HPI [name] is a 33 y.o. female presents to the ED with complaint of abdominal pain.  Patient states that this been going on off and on for approximately a month and that she feels like "something is moving around in her belly and she thinks she is pregnant".  Patient states that she had a tubal ligation but continues to have periods.  She currently is not speaking to her partner and also reports that there has been some vaginal discharge.  She has had intermittent vomiting after eating.  She denies any fever, chills, shortness of breath or changes in smell or taste.  She also is unaware of any exposure to COVID.  She rates her pain as 6 out of 10.      Past Medical History:  Diagnosis Date  . Abdominal pain affecting pregnancy, antepartum 02/28/2016  . Anemia    LAST HGB 04-03-17 12.5  . Anxiety   . Depression   . Gallstones 2018  . Incisional ventral hernia w obstruction   . Obesity 09/25/2013   Last Assessment & Plan:  Obesity in pregnancy is associated with maternal and obstetric complications including increased risk of gestational DM, gestational HTN, preeclampsia, thromboembolism, failed induction of labor, Cesarean section, excessive gestational weight gain and post-partum weight retention. Obesity is also associated with fetal complications including prematurity, stillbirth, congen  . Post-operative state 06/13/2016  . SIDS (sudden infant death syndrome) 09/29/2013   Last Assessment & Plan:  [name]'s third son died at three months of age from SIDS. I reviewed with [name] that the cause of SIDS is unknown. I did review with her that placing infants to  sleep on their backs without soft bedding and tobacco cessation are potentially modifiable risk factors to decrease her risk of SIDS.  Marland Kitchen. Small bowel obstruction (HCC)   . Supervision of high risk pregnancy in third trimester 09/12/2013   Last Assessment & Plan:  [name] [facility] She reports having an appointment tomorrow 12/3. I encouraged her to get a flu and TDAP vaccination. She is due for her CBC, RPR, third trimester HIV and glucola.   . Tobacco abuse 10/01/2013   Last Assessment & Plan:  I discussed with [name] that the risks of smoking in pregnancy include spontaneous pregnancy loss, placental abruption, preterm premature rupture of membranes (PPROM), placenta previa, preterm labor and delivery, and low birth weight (LBW) as well as SIDS. I discussed with her that her tobacco use is a modifiable risk factor for risk of placental abruption and SIDS, b    Patient Active Problem List   Diagnosis Date Noted  . Calculus of bile duct without cholecystitis with obstruction   . Encounter for fitting and adjustment of other gastrointestinal appliance and device   . Jaundice   . Obstruction of bile duct   . Choledocholithiasis 06/05/2017  . Incisional hernia 05/07/2017  . Incisional ventral hernia w obstruction   . Small bowel obstruction (HCC)   . Post-operative state 06/13/2016  . Abdominal pain affecting pregnancy, antepartum 02/28/2016  . IUFD (intrauterine fetal death) March 26, 2013  . Obesity March 26, 2013  . SIDS (sudden infant death syndrome) March 26, 2013  .  Supervision of high risk pregnancy in third trimester 2013-10-11  . Tobacco abuse October 11, 2013    Past Surgical History:  Procedure Laterality Date  . CESAREAN SECTION N/A 06/12/2016   Procedure: STAT CESAREAN SECTION WITH BILATERAL STERILIZATION;  Surgeon: Boykin Nearing, MD;  Location: [facility] ORS;  Service: Obstetrics;  Laterality: N/A;  Baby Girl @ 2246 Weight 5 lb 10 oz Apgars 8/9  .  ENDOSCOPIC RETROGRADE CHOLANGIOPANCREATOGRAPHY (ERCP) WITH PROPOFOL N/A 06/06/2017   Procedure: ENDOSCOPIC RETROGRADE CHOLANGIOPANCREATOGRAPHY (ERCP) WITH PROPOFOL;  Surgeon: Lucilla Lame, MD;  Location: [facility] ENDOSCOPY;  Service: Endoscopy;  Laterality: N/A;  . ERCP N/A 10/30/2017   Procedure: ENDOSCOPIC RETROGRADE CHOLANGIOPANCREATOGRAPHY (ERCP) STENT REMOVAL;  Surgeon: Lucilla Lame, MD;  Location: [facility] ENDOSCOPY;  Service: Endoscopy;  Laterality: N/A;  . INCISIONAL HERNIA REPAIR N/A 05/07/2017   Procedure: LAPAROSCOPIC INCISIONAL HERNIA;  Surgeon: Olean Ree, MD;  Location: [facility] ORS;  Service: General;  Laterality: N/A;  . TUBAL LIGATION    . VENTRAL HERNIA REPAIR N/A 06/19/2016   Procedure: HERNIA REPAIR VENTRAL ADULT;  Surgeon: Florene Glen, MD;  Location: [facility] ORS;  Service: General;  Laterality: N/A;    Prior to Admission medications   Medication Sig Start Date End Date Taking? Authorizing Provider  sulfamethoxazole-trimethoprim (BACTRIM DS) 800-160 MG tablet Take 1 tablet by mouth 2 (two) times daily. 05/30/19   Johnn Hai, PA-C    Allergies Amoxicillin and Vicodin [hydrocodone-acetaminophen]  Family History  Problem Relation Age of Onset  . Diabetes Mother   . Asthma Sister   . Hypertension Maternal Aunt   . Hypertension Maternal Uncle     Social History Social History   Tobacco Use  . Smoking status: Current Every Day Smoker    Packs/day: 1.00    Years: 10.00    Pack years: 10.00    Types: Cigarettes  . Smokeless tobacco: Never Used  Substance Use Topics  . Alcohol use: Yes    Alcohol/week: 7.0 standard drinks    Types: 7 Cans of beer per week    Comment: 2 40oz/day  . Drug use: No    Review of Systems Constitutional: No fever/chills ENT: No sore throat. Cardiovascular: Denies chest pain. Respiratory: Denies shortness of breath. Gastrointestinal: No abdominal pain.  No nausea, positive vomiting.  No diarrhea.  No constipation. Genitourinary:  Negative for dysuria.  Positive vaginal discharge. Musculoskeletal: Negative for back pain. Skin: Negative for rash. Neurological: Negative for headaches, focal weakness or numbness. ____________________________________________   PHYSICAL EXAM:  VITAL SIGNS: ED Triage Vitals  Enc Vitals Group     BP 05/30/19 1008 (!) 150/93     Pulse Rate 05/30/19 1008 100     Resp 05/30/19 1008 18     Temp 05/30/19 1008 98.4 F (36.9 C)     Temp src --      SpO2 05/30/19 1008 99 %     Weight 05/30/19 1009 295 lb (133.8 kg)     Height 05/30/19 1009 5\' 3"  (1.6 m)     Head Circumference --      Peak Flow --      Pain Score 05/30/19 1009 6     Pain Loc --      Pain Edu? --      Excl. in [place]? --     Constitutional: Alert and oriented. Well appearing and in no acute distress.  Morbidly obese. Eyes: Conjunctivae are normal.  Head: Atraumatic. Nose: No congestion/rhinnorhea. Neck: No stridor.   Cardiovascular: Normal rate, regular rhythm. Grossly  normal heart sounds.  Good peripheral circulation. Respiratory: Normal respiratory effort.  No retractions. Lungs CTAB. Gastrointestinal: Soft and nontender. No distention.  No CVA tenderness. Genitourinary: External genitalia is unremarkable.  Normal vaginal exam with no adnexal masses or tenderness noted. Musculoskeletal: No lower extremity tenderness nor edema.  No joint effusions. Neurologic:  Normal speech and language. No gross focal neurologic deficits are appreciated. No gait instability. Skin:  Skin is warm, dry and intact. No rash noted. Psychiatric: Mood and affect are normal. Speech and behavior are normal.  ____________________________________________   LABS (all labs ordered are listed, but only abnormal results are displayed)  Labs Reviewed  WET PREP, GENITAL - Abnormal; Notable for the following components:      Result Value   WBC, Wet Prep HPF POC MODERATE (*)    All other components within normal limits  COMPREHENSIVE METABOLIC  PANEL - Abnormal; Notable for the following components:   Potassium 3.4 (*)    AST 57 (*)    ALT 49 (*)    All other components within normal limits  URINALYSIS, COMPLETE (UACMP) WITH MICROSCOPIC - Abnormal; Notable for the following components:   Color, Urine AMBER (*)    APPearance TURBID (*)    Hgb urine dipstick SMALL (*)    Bilirubin Urine SMALL (*)    Protein, ur 30 (*)    Leukocytes,Ua MODERATE (*)    WBC, UA >50 (*)    All other components within normal limits  GC/CHLAMYDIA PROBE AMP  LIPASE, BLOOD  CBC  HCG, QUANTITATIVE, PREGNANCY  POC URINE PREG, ED  POCT PREGNANCY, URINE    PROCEDURES  Procedure(s) performed (including Critical Care):  Procedures   ____________________________________________   INITIAL IMPRESSION / ASSESSMENT AND PLAN / ED COURSE  As part of my medical decision making, I reviewed the following data within the electronic MEDICAL RECORD NUMBER Notes from prior ED visits and [place] Controlled Substance Database  33 year old female presents to the ED with complaint of abdominal pain and sensation that she is pregnant.  Urinalysis was consistent with an acute UTI.  Urine pregnancy and beta hCG confirmed that patient is not pregnant.  Meaning lab work was unremarkable.  Patient wet prep also unremarkable.  Patient is aware that her gonorrhea and Chlamydia test will result in 1 to 2 days and she will be called if positive.  Patient was treated with prescription for Bactrim DS twice daily for the next 10 days.  Patient is encouraged to increase fluids.  She is to [facility] if any continued problems and also if any continued abdominal problems.  ____________________________________________   FINAL CLINICAL IMPRESSION(S) / ED DIAGNOSES  Final diagnoses:  Acute urinary tract infection     ED Discharge Orders         Ordered    sulfamethoxazole-trimethoprim (BACTRIM DS) 800-160 MG tablet  2 times daily,   Status:  Discontinued      05/30/19 1445    sulfamethoxazole-trimethoprim (BACTRIM DS) 800-160 MG tablet  2 times daily     05/30/19 1459           Note:  This document was prepared using Dragon voice recognition software and may include unintentional dictation errors.    Tommi RumpsSummers, Jahmeir Geisen L, PA-C 05/30/19 1501    Miguel AschoffMonks, Sarah L., MD 05/30/19 [phone]

## 2019-05-30 NOTE — ED Notes (Signed)
Pelvic cart at bedside. 

## 2019-05-30 NOTE — ED Triage Notes (Signed)
Pt comes via POV from home with c/o abdominal pain. Pt states this has been going on for about a month. Pt states she thinks she could be pregnant. Pt states she feels like something is moving around in her belly.  Pt also states some vomiting for 2 weeks.

## 2019-05-30 NOTE — ED Notes (Signed)
Pelvic completed. Specimen sent to lab

## 2019-05-30 NOTE — Discharge Instructions (Addendum)
Call make an appointment with your primary care doctor [place] if any continued problems or concerns.  Begin taking Bactrim DS twice daily for the next 10 days.  Also increase fluids.  Decrease fried foods and foods containing milk at this time.  The culture for gonorrhea and chlamydia will be received in 1 to 2 days and you will be called if this is positive.

## 2019-06-04 LAB — GC/CHLAMYDIA PROBE AMP
Chlamydia trachomatis, NAA: NEGATIVE
Neisseria Gonorrhoeae by PCR: NEGATIVE

## 2019-06-26 ENCOUNTER — Other Ambulatory Visit
Admission: RE | Admit: 2019-06-26 | Discharge: 2019-06-26 | Disposition: A | Discharge status: Home / Self Care | Payer: Medicaid Other | Attending: Gastroenterology | Admitting: Gastroenterology

## 2019-06-26 ENCOUNTER — Other Ambulatory Visit: Payer: Self-pay

## 2019-06-26 ENCOUNTER — Ambulatory Visit (INDEPENDENT_AMBULATORY_CARE_PROVIDER_SITE_OTHER): Payer: Medicaid Other | Admitting: Gastroenterology

## 2019-06-26 ENCOUNTER — Encounter: Payer: Self-pay | Admitting: Gastroenterology

## 2019-06-26 VITALS — BP 112/79 | HR 92 | Temp 97.3°F | Ht 63.0 in | Wt 295.4 lb

## 2019-06-26 DIAGNOSIS — K831 Obstruction of bile duct: Secondary | ICD-10-CM

## 2019-06-26 DIAGNOSIS — R748 Abnormal levels of other serum enzymes: Secondary | ICD-10-CM

## 2019-06-26 NOTE — Progress Notes (Signed)
Primary Care Physician: [facility]  Primary Gastroenterologist:  Dr. Lucilla Lame  Chief Complaint  Patient presents with  . Abdominal Pain    HPI: [name] is a 33 y.o. female here for follow-up after being emergency department.  The patient was in the emergency department abdominal pain which she reports had lasted approximately 1 month at that time.  The patient was seen in the ER again August.  She reports that she feels like something is moving around in her abdomen and she thought she was pregnant.  The patient's pregnancy test was negative and her liver enzymes were found to be elevated.  The patient was also found to have urinary tract infection and she was started on treatment for that.  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 The patient reports that the abdominal pain is worse when she moves around.  It is not associated with eating.  She does know that she has some gallstones in her gallbladder and she was wondering if the gallstones are causing her problems.  No current outpatient medications on file.   No current facility-administered medications for this visit.     Allergies as of 06/26/2019 - Review Complete 06/26/2019  Allergen Reaction Noted  . Amoxicillin  05/07/2018  . Vicodin [hydrocodone-acetaminophen]  05/07/2018    ROS:  General: Negative for anorexia, weight loss, fever, chills, fatigue, weakness. ENT: Negative for hoarseness, difficulty swallowing , nasal congestion. CV: Negative for chest pain, angina, palpitations, dyspnea on exertion, peripheral edema.  Respiratory: Negative for dyspnea at rest, dyspnea on exertion, cough, sputum, wheezing.  GI: See history of present illness. GU:  Negative for dysuria, hematuria, urinary incontinence, urinary frequency, nocturnal urination.  Endo: Negative for unusual weight change.    Physical  Examination:   BP 112/79   Pulse 92   Temp (!) 97.3 F (36.3 C) (Temporal)   Ht 5\' 3"  (1.6 m)   Wt 295 lb 6.4 oz (134 kg)   BMI 52.33 kg/m   General: Well-nourished, well-developed in no acute distress.  Eyes: No icterus. Conjunctivae pink. Mouth: Oropharyngeal mucosa moist and pink , no lesions erythema or exudate. Lungs: Clear to auscultation bilaterally. Non-labored. Heart: Regular rate and rhythm, no murmurs rubs or gallops.  Abdomen: Bowel sounds are normal, positive tenderness to one finger palpation while flexing the abdominal wall muscles in the right middle quadrant.  The gallbladder is not tender, nondistended, no hepatosplenomegaly or masses, no abdominal bruits or hernia , no rebound or guarding.   Extremities: No lower extremity edema. No clubbing or deformities. Neuro: Alert and oriented x 3.  Grossly intact. Skin: Warm and dry, no jaundice.   Psych: Alert and cooperative, normal mood and affect.  Labs:    Imaging Studies: No results found.  Assessment and Plan:   [name] is a 33 y.o. y/o female who is morbidly obese to has a history of abnormal liver enzymes and will be set up for blood work to look for other possible causes of abnormal liver.  The patient bilirubin is normal and a biliary stricture is unlikely to have reoccurred with the patient's bilirubin being normal.  The patient has also been told that her abdominal pain is consistent with musculoskeletal pain and not gallbladder pain.  The patient reports that she also suffers from back pain which is commonly seen in patients with abdominal wall pain.  The patient has told to treat the abdominal wall pain conservatively with heating pads and anti-inflammatories.    Katherine Miniumarren Latasha Buczkowski, MD. Clementeen GrahamFACG   Note: This dictation was prepared with Dragon dictation along with smaller phrase technology. Any transcriptional errors that result from this process are unintentional.

## 2019-08-01 ENCOUNTER — Other Ambulatory Visit: Payer: Self-pay

## 2019-08-01 ENCOUNTER — Encounter: Payer: Self-pay | Admitting: Emergency Medicine

## 2019-08-01 ENCOUNTER — Emergency Department
Admission: EM | Admit: 2019-08-01 | Discharge: 2019-08-03 | Disposition: A | Discharge status: Other Acute Inpatient Hospital | Payer: Medicaid Other | Attending: Emergency Medicine | Admitting: Emergency Medicine

## 2019-08-01 DIAGNOSIS — F22 Delusional disorders: Secondary | ICD-10-CM | POA: Insufficient documentation

## 2019-08-01 DIAGNOSIS — F309 Manic episode, unspecified: Secondary | ICD-10-CM | POA: Diagnosis present

## 2019-08-01 DIAGNOSIS — F17210 Nicotine dependence, cigarettes, uncomplicated: Secondary | ICD-10-CM | POA: Diagnosis not present

## 2019-08-01 DIAGNOSIS — Z20828 Contact with and (suspected) exposure to other viral communicable diseases: Secondary | ICD-10-CM | POA: Insufficient documentation

## 2019-08-01 DIAGNOSIS — F29 Unspecified psychosis not due to a substance or known physiological condition: Secondary | ICD-10-CM | POA: Insufficient documentation

## 2019-08-01 LAB — CBC
HCT: 44.8 % (ref 36.0–46.0)
Hemoglobin: 15.1 g/dL — ABNORMAL HIGH (ref 12.0–15.0)
MCH: 27.6 pg (ref 26.0–34.0)
MCHC: 33.7 g/dL (ref 30.0–36.0)
MCV: 81.8 fL (ref 80.0–100.0)
Platelets: 408 10*3/uL — ABNORMAL HIGH (ref 150–400)
RBC: 5.48 MIL/uL — ABNORMAL HIGH (ref 3.87–5.11)
RDW: 13.6 % (ref 11.5–15.5)
WBC: 14.3 10*3/uL — ABNORMAL HIGH (ref 4.0–10.5)
nRBC: 0 % (ref 0.0–0.2)

## 2019-08-01 LAB — COMPREHENSIVE METABOLIC PANEL
ALT: 34 U/L (ref 0–44)
AST: 36 U/L (ref 15–41)
Albumin: 4.4 g/dL (ref 3.5–5.0)
Alkaline Phosphatase: 65 U/L (ref 38–126)
Anion gap: 13 (ref 5–15)
BUN: <5 mg/dL — ABNORMAL LOW (ref 6–20)
CO2: 26 mmol/L (ref 22–32)
Calcium: 10 mg/dL (ref 8.9–10.3)
Chloride: 100 mmol/L (ref 98–111)
Creatinine, Ser: 0.7 mg/dL (ref 0.44–1.00)
GFR calc Af Amer: >60 mL/min (ref >60)
GFR calc non Af Amer: >60 mL/min (ref >60)
Glucose, Bld: 121 mg/dL — ABNORMAL HIGH (ref 70–99)
Potassium: 2.7 mmol/L — CL (ref 3.5–5.1)
Sodium: 139 mmol/L (ref 135–145)
Total Bilirubin: 1.3 mg/dL — ABNORMAL HIGH (ref 0.3–1.2)
Total Protein: 9.2 g/dL — ABNORMAL HIGH (ref 6.5–8.1)

## 2019-08-01 LAB — SARS CORONAVIRUS 2 BY RT PCR (HOSPITAL ORDER, PERFORMED IN ~~LOC~~ HOSPITAL LAB): SARS Coronavirus 2: NEGATIVE

## 2019-08-01 LAB — MAGNESIUM: Magnesium: 2 mg/dL (ref 1.7–2.4)

## 2019-08-01 LAB — SALICYLATE LEVEL: Salicylate Lvl: <7 mg/dL (ref 2.8–30.0)

## 2019-08-01 LAB — ACETAMINOPHEN LEVEL: Acetaminophen (Tylenol), Serum: <10 ug/mL — ABNORMAL LOW (ref 10–30)

## 2019-08-01 LAB — ETHANOL: Alcohol, Ethyl (B): <10 mg/dL (ref <10)

## 2019-08-01 MED ORDER — POTASSIUM CHLORIDE CRYS ER 20 MEQ PO TBCR: 40.0000 meq | EXTENDED_RELEASE_TABLET | Freq: Every day | ORAL | Status: DC

## 2019-08-01 MED ORDER — POTASSIUM CHLORIDE CRYS ER 20 MEQ PO TBCR
60.0000 meq | EXTENDED_RELEASE_TABLET | Freq: Once | ORAL | Status: AC
  Administered 2019-08-01: 60 meq via ORAL
  Filled 2019-08-01: qty 3

## 2019-08-01 MED ORDER — LORAZEPAM 2 MG PO TABS
2.0000 mg | ORAL_TABLET | Freq: Once | ORAL | Status: AC
  Administered 2019-08-01: 2 mg via ORAL
  Filled 2019-08-01: qty 1

## 2019-08-01 MED ORDER — POTASSIUM CHLORIDE 20 MEQ/15ML (10%) PO SOLN
40.0000 meq | Freq: Once | ORAL | Status: DC
  Filled 2019-08-01: qty 30

## 2019-08-01 NOTE — ED Notes (Signed)
Pt to dress out into hospital scrubs. Pt Given personal belongings bag. Personal items of pt include: Black and white shirt, black pants and nike slides.

## 2019-08-01 NOTE — ED Triage Notes (Signed)
Pt here via [employer].  Pt reports "I'm delusional.  Like I don't know" reports hearing voices and "staying up for days at a time", reports persecution "the police and social services put something in my house to record me", pt speech is disjointed and focus is poor, reports "I imagine I have super powers"  Reports dx of depression and denies further  regular ETOH and recent "pill"use  Reports positive COVID test approx 4 weeks prior at [facility]

## 2019-08-01 NOTE — ED Notes (Signed)
Patient to ED via [facility] for voluntary psych assessment.  Officer Hudson reports family stated that patient has been declining over the past month, that she has been thinking someone is going to kill her, that she has small children and that family don't feel comfortable leaving children with her, also report AH.  Per patient she tested positive for COVID at [facility] last week.

## 2019-08-01 NOTE — ED Provider Notes (Signed)
[facility] Emergency Department Provider Note  ____________________________________________   First MD Initiated Contact with Patient 08/01/19 2217     (approximate)  I have reviewed the triage vital signs and the nursing notes.   HISTORY  Chief Complaint Manic Behavior and Delusional    HPI [name] is a 33 y.o. female who presents with [place] [employer] for voluntary psych assessment.  Essentially per family she has been declining over the past month but she thinks that someone is going to kill her.  Patient did test positive for coronavirus at to clinic last week per triage note.  However when I discussed with patient she says that she tested positive a month ago.  Patient is not sure why she is here.  She denies any HI, SI, auditory visual hallucinations.  She is not wanting to give any other additional information.  History is limited due to patient not knowing exactly why she is here and unable to give additional information.          Past Medical History:  Diagnosis Date  . Abdominal pain affecting pregnancy, antepartum 02/28/2016  . Anemia    LAST HGB 04-03-17 12.5  . Anxiety   . Depression   . Gallstones 2018  . Incisional ventral hernia w obstruction   . Obesity 2013/09/13   Last Assessment & Plan:  Obesity in pregnancy is associated with maternal and obstetric complications including increased risk of gestational DM, gestational HTN, preeclampsia, thromboembolism, failed induction of labor, Cesarean section, excessive gestational weight gain and post-partum weight retention. Obesity is also associated with fetal complications including prematurity, stillbirth, congen  . Post-operative state 06/15/2016  . SIDS (sudden infant death syndrome) Sep 13, 2013   Last Assessment & Plan:  Ms. Walls third son died at three months of age from [place]. I reviewed with Ms. Greenhouse that the cause of SIDS is unknown. I did review with her that placing  infants to sleep on their backs without soft bedding and tobacco cessation are potentially modifiable risk factors to decrease her risk of SIDS.  Marland Kitchen Small bowel obstruction ([place])   . Supervision of high risk pregnancy in third trimester Sep 13, 2013   Last Assessment & Plan:  Ms. Hew is receiving her care at [facility]. She reports having an appointment tomorrow 12/3. I encouraged her to get a flu and TDAP vaccination. She is due for her CBC, RPR, third trimester HIV and glucola.   . Tobacco abuse September 13, 2013   Last Assessment & Plan:  I discussed with Ms. Spells that the risks of smoking in pregnancy include spontaneous pregnancy loss, placental abruption, preterm premature rupture of membranes (PPROM), placenta previa, preterm labor and delivery, and low birth weight (LBW) as well as SIDS. I discussed with her that her tobacco use is a modifiable risk factor for risk of placental abruption and SIDS, b    Patient Active Problem List   Diagnosis Date Noted  . Calculus of bile duct without cholecystitis with obstruction   . Encounter for fitting and adjustment of other gastrointestinal appliance and device   . Jaundice   . Obstruction of bile duct   . Choledocholithiasis 06/05/2017  . Incisional hernia 05/07/2017  . Incisional ventral hernia w obstruction   . Small bowel obstruction ([place])   . Post-operative state Jun 15, 2016  . Abdominal pain affecting pregnancy, antepartum 02/28/2016  . IUFD (intrauterine fetal death) Sep 13, 2013  . Obesity 2013-09-13  . SIDS (sudden infant death syndrome) 09-13-2013  . Supervision of  high risk pregnancy in third trimester 05-29-13  . Tobacco abuse 05-29-13    Past Surgical History:  Procedure Laterality Date  . CESAREAN SECTION N/A 06/12/2016   Procedure: STAT CESAREAN SECTION WITH BILATERAL STERILIZATION;  Surgeon: Suzy Bouchardhomas J Schermerhorn, MD;  Location: [facility] ORS;  Service: Obstetrics;  Laterality: N/A;  Baby Girl @ 2246 Weight 5 lb 10 oz Apgars  8/9  . ENDOSCOPIC RETROGRADE CHOLANGIOPANCREATOGRAPHY (ERCP) WITH PROPOFOL N/A 06/06/2017   Procedure: ENDOSCOPIC RETROGRADE CHOLANGIOPANCREATOGRAPHY (ERCP) WITH PROPOFOL;  Surgeon: Midge MiniumWohl, Darren, MD;  Location: [facility] ENDOSCOPY;  Service: Endoscopy;  Laterality: N/A;  . ERCP N/A 10/30/2017   Procedure: ENDOSCOPIC RETROGRADE CHOLANGIOPANCREATOGRAPHY (ERCP) STENT REMOVAL;  Surgeon: Midge MiniumWohl, Darren, MD;  Location: [facility] ENDOSCOPY;  Service: Endoscopy;  Laterality: N/A;  . INCISIONAL HERNIA REPAIR N/A 05/07/2017   Procedure: LAPAROSCOPIC INCISIONAL HERNIA;  Surgeon: Henrene DodgePiscoya, Jose, MD;  Location: [facility] ORS;  Service: General;  Laterality: N/A;  . TUBAL LIGATION    . VENTRAL HERNIA REPAIR N/A 06/19/2016   Procedure: HERNIA REPAIR VENTRAL ADULT;  Surgeon: Lattie Hawichard E Cooper, MD;  Location: [facility] ORS;  Service: General;  Laterality: N/A;    Prior to Admission medications   Not on File    Allergies Amoxicillin and Vicodin [hydrocodone-acetaminophen]  Family History  Problem Relation Age of Onset  . Diabetes Mother   . Asthma Sister   . Hypertension Maternal Aunt   . Hypertension Maternal Uncle     Social History Social History   Tobacco Use  . Smoking status: Current Every Day Smoker    Packs/day: 1.50    Years: 10.00    Pack years: 15.00    Types: Cigarettes  . Smokeless tobacco: Never Used  Substance Use Topics  . Alcohol use: Yes    Alcohol/week: 7.0 standard drinks    Types: 7 Cans of beer per week    Comment: 2 40oz/day  . Drug use: Yes    Comment: "pills"      Review of Systems Constitutional: No fever/chills Eyes: No visual changes. ENT: No sore throat. Cardiovascular: Denies chest pain. Respiratory: Denies shortness of breath. Gastrointestinal: No abdominal pain.  No nausea, no vomiting.  No diarrhea.  No constipation. Genitourinary: Negative for dysuria. Musculoskeletal: Negative for back pain. Skin: Negative for rash. Neurological: Negative for headaches, focal weakness  or numbness. All other ROS negative ____________________________________________   PHYSICAL EXAM:  VITAL SIGNS: ED Triage Vitals  Enc Vitals Group     BP 08/01/19 2130 (!) 147/99     Pulse Rate 08/01/19 2130 91     Resp 08/01/19 2130 18     Temp 08/01/19 2130 99 F (37.2 C)     Temp Source 08/01/19 2130 Oral     SpO2 08/01/19 2130 97 %     Weight 08/01/19 2133 (!) 305 lb (138.3 kg)     Height 08/01/19 2133 5\' 4"  (1.626 m)     Head Circumference --      Peak Flow --      Pain Score 08/01/19 2133 0     Pain Loc --      Pain Edu? --      Excl. in GC? --     Constitutional: Alert and oriented. Well appearing and in no acute distress. Eyes: Conjunctivae are normal. EOMI. Head: Atraumatic. Nose: No congestion/rhinnorhea. Mouth/Throat: Mucous membranes are moist.   Neck: No stridor. Trachea Midline. FROM Cardiovascular: Normal rate, regular rhythm. Grossly normal heart sounds.  Good peripheral circulation. Respiratory: Normal respiratory effort.  No  retractions. Lungs CTAB. Gastrointestinal: Soft and nontender. No distention. No abdominal bruits.  Musculoskeletal: No lower extremity tenderness nor edema.  No joint effusions. Neurologic:  Normal speech and language. No gross focal neurologic deficits are appreciated.  Skin:  Skin is warm, dry and intact. No rash noted. Psychiatric: Flat affect denies SI, HI, auditory or visual hallucination GU: Deferred   ____________________________________________   LABS (all labs ordered are listed, but only abnormal results are displayed)  Labs Reviewed  COMPREHENSIVE METABOLIC PANEL - Abnormal; Notable for the following components:      Result Value   Potassium 2.7 (*)    Glucose, Bld 121 (*)    BUN <5 (*)    Total Protein 9.2 (*)    Total Bilirubin 1.3 (*)    All other components within normal limits  ACETAMINOPHEN LEVEL - Abnormal; Notable for the following components:   Acetaminophen (Tylenol), Serum <10 (*)    All other  components within normal limits  CBC - Abnormal; Notable for the following components:   WBC 14.3 (*)    RBC 5.48 (*)    Hemoglobin 15.1 (*)    Platelets 408 (*)    All other components within normal limits  SARS CORONAVIRUS 2 BY RT PCR (HOSPITAL ORDER, PERFORMED IN [facility])  ETHANOL  SALICYLATE LEVEL  URINE DRUG SCREEN, QUALITATIVE ([facility] ONLY)  MAGNESIUM  POC URINE PREG, ED   ____________________________________________   INITIAL IMPRESSION / ASSESSMENT AND PLAN / ED COURSE  [name] was evaluated in Emergency Department on 08/01/2019 for the symptoms described in the history of present illness. She was evaluated in the context of the global COVID-19 pandemic, which necessitated consideration that the patient might be at risk for infection with the SARS-CoV-2 virus that causes COVID-19. Institutional protocols and algorithms that pertain to the evaluation of patients at risk for COVID-19 are in a state of rapid change based on information released by regulatory bodies including the CDC and federal and state organizations. These policies and algorithms were followed during the patient's care in the ED.    Patient is a 33 year old who is never been seen here for psychiatric illness but has had 1 month of some paranoia and mania behavior per family.  Patient also with report of baby's being recently tested positive for coronavirus.  Will get Covid testing.  Patient will need additional information from TTS to understand the onset of this to see if this is in conjunction with the diagnosis of coronavirus.  If patient still coronavirus positive.  She may need additional work-up for corona encephalitis.  If coronavirus is positive and with additional information from family this does not seem to be the same time.  Of the positive coronavirus then it may be a new psychiatric illness.  Will get labs to evaluate for alcohol intoxication, AKI, drug use.  K was noted to be 2.7.   Patient given oral repletion.  Patient handed off pending coronavirus test and evaluation by TTS.   ____________________________________________   FINAL CLINICAL IMPRESSION(S) / ED DIAGNOSES   Final diagnoses:  Paranoia (HCC)      MEDICATIONS GIVEN DURING THIS VISIT:  Medications  potassium chloride SA (KLOR-CON) CR tablet 60 mEq (has no administration in time range)  LORazepam (ATIVAN) tablet 2 mg (has no administration in time range)     ED Discharge Orders    None       Note:  This document was prepared using Dragon voice recognition software and  may include unintentional dictation errors.   Concha Se, MD 08/01/19 2337

## 2019-08-01 NOTE — ED Notes (Addendum)
Assumed care of patient, patient with flat affect unable to express what brings her to ed tonight.  Reports she is here because of the unclear stuff and its due to covid. Patient awaiting medical clearance and psych eval. Denied HV/SI/SI.

## 2019-08-02 DIAGNOSIS — F309 Manic episode, unspecified: Secondary | ICD-10-CM

## 2019-08-02 DIAGNOSIS — F22 Delusional disorders: Secondary | ICD-10-CM

## 2019-08-02 LAB — URINE DRUG SCREEN, QUALITATIVE (ARMC ONLY)
Amphetamines, Ur Screen: NOT DETECTED
Barbiturates, Ur Screen: NOT DETECTED
Benzodiazepine, Ur Scrn: NOT DETECTED
Cannabinoid 50 Ng, Ur ~~LOC~~: NOT DETECTED
Cocaine Metabolite,Ur ~~LOC~~: NOT DETECTED
MDMA (Ecstasy)Ur Screen: NOT DETECTED
Methadone Scn, Ur: NOT DETECTED
Opiate, Ur Screen: NOT DETECTED
Phencyclidine (PCP) Ur S: NOT DETECTED
Tricyclic, Ur Screen: NOT DETECTED

## 2019-08-02 LAB — POCT PREGNANCY, URINE: Preg Test, Ur: NEGATIVE

## 2019-08-02 MED ORDER — POTASSIUM CHLORIDE CRYS ER 20 MEQ PO TBCR
40.0000 meq | EXTENDED_RELEASE_TABLET | Freq: Two times a day (BID) | ORAL | Status: DC
  Administered 2019-08-02 – 2019-08-03 (×3): 40 meq via ORAL
  Filled 2019-08-02 (×3): qty 2

## 2019-08-02 MED ORDER — HALOPERIDOL 2 MG PO TABS
2.0000 mg | ORAL_TABLET | Freq: Two times a day (BID) | ORAL | Status: DC
  Administered 2019-08-02 – 2019-08-03 (×3): 2 mg via ORAL
  Filled 2019-08-02: qty 4
  Filled 2019-08-02: qty 1
  Filled 2019-08-02 (×2): qty 4

## 2019-08-02 NOTE — ED Notes (Signed)
Pt. Laying on bed watching tv.  Pt. States she is feeling fine.  Pt. Awaiting placement.

## 2019-08-02 NOTE — ED Notes (Signed)
Pt. Given meal tray and drink upon request. 

## 2019-08-02 NOTE — ED Notes (Signed)
Pt. Up using bathroom. 

## 2019-08-02 NOTE — BH Assessment (Signed)
Clinician spoke to pt's nurse, [place] RN, regarding pt's ability to complete their [place] Assessment at this time. Pt is currently sleeping and had a difficult time settling in, so it was determined that pt should be permitted to sleep for as long as possible prior to be woken up. Matt agreed to call if pt wakes up prior to 0700 meds.

## 2019-08-02 NOTE — Consult Note (Addendum)
[facility] Face-to-Face Psychiatry Consult   Reason for Consult:  Psychosis  Referring Physician:  EDP Patient Identification: [name] MRN:  [id number] Principal Diagnosis: Psychosis Diagnosis:  Active Problems:   Mania (HCC)   Psychosis, paranoid (HCC)  Total Time spent with patient: 1 hour  Subjective:   [name] is a 33 y.o. female patient admitted with psychosis.  "When I go to sleep, my spirit leaves my body."    Patient seen and evaluated in person by this provider.  Some tangential thought processes and positive for delusions.  Family reports that she was hearing voices at but patient does not answer to this question.  Paranoid about people recording her and being after her.  She does report lack of sleep prior to admission and medications given upon arrival to the emergency department.  No past history in the chart and negative for substance abuse.  Needs psychiatric admission at this time.  HPI per ED notes:  Pt here via [employer].  Pt reports "I'm delusional.  Like I don't know" reports hearing voices and "staying up for days at a time", reports persecution "the police and social services put something in my house to record me", pt speech is disjointed and focus is poor, reports "I imagine I have super powers"  Past Psychiatric History:   Depression and anxiety  Risk to Self:  yes Risk to Others:  none Prior Inpatient Therapy:  none Prior Outpatient Therapy:  none  Past Medical History:  Past Medical History:  Diagnosis Date  . Abdominal pain affecting pregnancy, antepartum 02/28/2016  . Anemia    LAST HGB 04-03-17 12.5  . Anxiety   . Depression   . Gallstones 2018  . Incisional ventral hernia w obstruction   . Obesity 10/03/13   Last Assessment & Plan:  Obesity in pregnancy is associated with maternal and obstetric complications including increased risk of gestational DM, gestational HTN, preeclampsia, thromboembolism, failed induction of labor, Cesarean section,  excessive gestational weight gain and post-partum weight retention. Obesity is also associated with fetal complications including prematurity, stillbirth, congen  . Post-operative state 2016/07/05  . SIDS (sudden infant death syndrome) 10-03-2013   Last Assessment & Plan:  Ms. Orebaugh third son died at three months of age from SIDS. I reviewed with Ms. Vanvorst that the cause of SIDS is unknown. I did review with her that placing infants to sleep on their backs without soft bedding and tobacco cessation are potentially modifiable risk factors to decrease her risk of SIDS.  Marland Kitchen Small bowel obstruction (HCC)   . Supervision of high risk pregnancy in third trimester October 03, 2013   Last Assessment & Plan:  Ms. Rowlands is receiving her [facility] at [facility]. She reports having an appointment tomorrow 12/3. I encouraged her to get a flu and TDAP vaccination. She is due for her CBC, RPR, third trimester HIV and glucola.   . Tobacco abuse 03-Oct-2013   Last Assessment & Plan:  I discussed with Ms. Zapf that the risks of smoking in pregnancy include spontaneous pregnancy loss, placental abruption, preterm premature rupture of membranes (PPROM), placenta previa, preterm labor and delivery, and low birth weight (LBW) as well as SIDS. I discussed with her that her tobacco use is a modifiable risk factor for risk of placental abruption and SIDS, b    Past Surgical History:  Procedure Laterality Date  . CESAREAN SECTION N/A 06/12/2016   Procedure: STAT CESAREAN SECTION WITH BILATERAL STERILIZATION;  Surgeon: Suzy Bouchard, MD;  Location: [facility] ORS;  Service: Obstetrics;  Laterality: N/A;  Baby Girl @ 2246 Weight 5 lb 10 oz Apgars 8/9  . ENDOSCOPIC RETROGRADE CHOLANGIOPANCREATOGRAPHY (ERCP) WITH PROPOFOL N/A 06/06/2017   Procedure: ENDOSCOPIC RETROGRADE CHOLANGIOPANCREATOGRAPHY (ERCP) WITH PROPOFOL;  Surgeon: Midge MiniumWohl, Darren, MD;  Location: [facility] ENDOSCOPY;  Service: Endoscopy;  Laterality: N/A;  . ERCP N/A 10/30/2017    Procedure: ENDOSCOPIC RETROGRADE CHOLANGIOPANCREATOGRAPHY (ERCP) STENT REMOVAL;  Surgeon: Midge MiniumWohl, Darren, MD;  Location: [facility] ENDOSCOPY;  Service: Endoscopy;  Laterality: N/A;  . INCISIONAL HERNIA REPAIR N/A 05/07/2017   Procedure: LAPAROSCOPIC INCISIONAL HERNIA;  Surgeon: Henrene DodgePiscoya, Jose, MD;  Location: [facility] ORS;  Service: General;  Laterality: N/A;  . TUBAL LIGATION    . VENTRAL HERNIA REPAIR N/A 06/19/2016   Procedure: HERNIA REPAIR VENTRAL ADULT;  Surgeon: Lattie Hawichard E Cooper, MD;  Location: [facility] ORS;  Service: General;  Laterality: N/A;   Family History:  Family History  Problem Relation Age of Onset  . Diabetes Mother   . Asthma Sister   . Hypertension Maternal Aunt   . Hypertension Maternal Uncle    Family Psychiatric  History: none Social History:  Social History   Substance and Sexual Activity  Alcohol Use Yes  . Alcohol/week: 7.0 standard drinks  . Types: 7 Cans of beer per week   Comment: 2 40oz/day     Social History   Substance and Sexual Activity  Drug Use Yes   Comment: "pills"    Social History   Socioeconomic History  . Marital status: Single    Spouse name: Not on file  . Number of children: Not on file  . Years of education: Not on file  . Highest education level: Not on file  Occupational History  . Not on file  Social Needs  . Financial resource strain: Not on file  . Food insecurity    Worry: Not on file    Inability: Not on file  . Transportation needs    Medical: Not on file    Non-medical: Not on file  Tobacco Use  . Smoking status: Current Every Day Smoker    Packs/day: 1.50    Years: 10.00    Pack years: 15.00    Types: Cigarettes  . Smokeless tobacco: Never Used  Substance and Sexual Activity  . Alcohol use: Yes    Alcohol/week: 7.0 standard drinks    Types: 7 Cans of beer per week    Comment: 2 40oz/day  . Drug use: Yes    Comment: "pills"  . Sexual activity: Never  Lifestyle  . Physical activity    Days per week: Not on file     Minutes per session: Not on file  . Stress: Not on file  Relationships  . Social Musicianconnections    Talks on phone: Not on file    Gets together: Not on file    Attends religious service: Not on file    Active member of club or organization: Not on file    Attends meetings of clubs or organizations: Not on file    Relationship status: Not on file  Other Topics Concern  . Not on file  Social History Narrative  . Not on file   Additional Social History:    Allergies:   Allergies  Allergen Reactions  . Amoxicillin   . Vicodin [Hydrocodone-Acetaminophen]     Labs:  Results for orders placed or performed during the hospital encounter of 08/01/19 (from the past 48 hour(s))  Comprehensive metabolic panel  Status: Abnormal   Collection Time: 08/01/19  9:40 PM  Result Value Ref Range   Sodium 139 135 - 145 mmol/L   Potassium 2.7 (LL) 3.5 - 5.1 mmol/L    Comment: CRITICAL RESULT CALLED TO, READ BACK BY AND VERIFIED WITH JEANETTE PEREZ ON 08/01/19 AT [address] [facility]    Chloride 100 98 - 111 mmol/L   CO2 26 22 - 32 mmol/L   Glucose, Bld 121 (H) 70 - 99 mg/dL   BUN <5 (L) 6 - 20 mg/dL   Creatinine, Ser 0.70 0.44 - 1.00 mg/dL   Calcium 10.0 8.9 - 10.3 mg/dL   Total Protein 9.2 (H) 6.5 - 8.1 g/dL   Albumin 4.4 3.5 - 5.0 g/dL   AST 36 15 - 41 U/L   ALT 34 0 - 44 U/L   Alkaline Phosphatase 65 38 - 126 U/L   Total Bilirubin 1.3 (H) 0.3 - 1.2 mg/dL   GFR calc non Af Amer >60 >60 mL/min   GFR calc Af Amer >60 >60 mL/min   Anion gap 13 5 - 15    Comment: Performed at [facility], [address] [place], [place] [address]  Ethanol     Status: None   Collection Time: 08/01/19  9:40 PM  Result Value Ref Range   Alcohol, Ethyl (B) <10 <10 mg/dL    Comment: (NOTE) Lowest detectable limit for serum alcohol is 10 mg/dL. For medical purposes only. Performed at [facility], [address] [place], [place] [address]   Salicylate level     Status: None   Collection  Time: 08/01/19  9:40 PM  Result Value Ref Range   Salicylate Lvl <2.9 2.8 - 30.0 mg/dL    Comment: Performed at [facility], [place], [place] [address]  Acetaminophen level     Status: Abnormal   Collection Time: 08/01/19  9:40 PM  Result Value Ref Range   Acetaminophen (Tylenol), Serum <10 (L) 10 - 30 ug/mL    Comment: (NOTE) Therapeutic concentrations vary significantly. A range of 10-30 ug/mL  may be an effective concentration for many patients. However, some  are best treated at concentrations outside of this range. Acetaminophen concentrations >150 ug/mL at 4 hours after ingestion  and >50 ug/mL at 12 hours after ingestion are often associated with  toxic reactions. Performed at [facility], [address] [place], [place] [address]   cbc     Status: Abnormal   Collection Time: 08/01/19  9:40 PM  Result Value Ref Range   WBC 14.3 (H) 4.0 - 10.5 K/uL   RBC 5.48 (H) 3.87 - 5.11 MIL/uL   Hemoglobin 15.1 (H) 12.0 - 15.0 g/dL   HCT 44.8 36.0 - 46.0 %   MCV 81.8 80.0 - 100.0 fL   MCH 27.6 26.0 - 34.0 pg   MCHC 33.7 30.0 - 36.0 g/dL   RDW 13.6 11.5 - 15.5 %   Platelets 408 (H) 150 - 400 K/uL   nRBC 0.0 0.0 - 0.2 %    Comment: Performed at [facility], [address], [place], [place] [address]  Urine Drug Screen, Qualitative     Status: None   Collection Time: 08/01/19  9:40 PM  Result Value Ref Range   Tricyclic, Ur Screen NONE DETECTED NONE DETECTED   Amphetamines, Ur Screen NONE DETECTED NONE DETECTED   MDMA (Ecstasy)Ur Screen NONE DETECTED NONE DETECTED   Cocaine Metabolite,Ur [place] NONE DETECTED NONE DETECTED   Opiate,  Ur Screen NONE DETECTED NONE DETECTED   Phencyclidine (PCP) Ur S NONE DETECTED NONE DETECTED   Cannabinoid 50 Ng, Ur Rew NONE DETECTED NONE DETECTED   Barbiturates, Ur Screen NONE DETECTED NONE DETECTED   Benzodiazepine, Ur Scrn NONE DETECTED NONE DETECTED   Methadone Scn, Ur NONE DETECTED NONE DETECTED     Comment: (NOTE) Tricyclics + metabolites, urine    Cutoff 1000 ng/mL Amphetamines + metabolites, urine  Cutoff 1000 ng/mL MDMA (Ecstasy), urine              Cutoff 500 ng/mL Cocaine Metabolite, urine          Cutoff 300 ng/mL Opiate + metabolites, urine        Cutoff 300 ng/mL Phencyclidine (PCP), urine         Cutoff 25 ng/mL Cannabinoid, urine                 Cutoff 50 ng/mL Barbiturates + metabolites, urine  Cutoff 200 ng/mL Benzodiazepine, urine              Cutoff 200 ng/mL Methadone, urine                   Cutoff 300 ng/mL The urine drug screen provides only a preliminary, unconfirmed analytical test result and should not be used for non-medical purposes. Clinical consideration and professional judgment should be applied to any positive drug screen result due to possible interfering substances. A more specific alternate chemical method must be used in order to obtain a confirmed analytical result. Gas chromatography / mass spectrometry (GC/MS) is the preferred confirmat ory method. Performed at [facility], [address] [place], [place] [address]   Magnesium     Status: None   Collection Time: 08/01/19  9:40 PM  Result Value Ref Range   Magnesium 2.0 1.7 - 2.4 mg/dL    Comment: Performed at [facility], [address] [place], [place] [address]  SARS Coronavirus 2 by RT PCR (hospital order, performed in [facility] hospital lab) Nasopharyngeal Nasopharyngeal Swab     Status: None   Collection Time: 08/01/19 10:40 PM   Specimen: Nasopharyngeal Swab  Result Value Ref Range   SARS Coronavirus 2 NEGATIVE NEGATIVE    Comment: (NOTE) If result is NEGATIVE SARS-CoV-2 target nucleic acids are NOT DETECTED. The SARS-CoV-2 RNA is generally detectable in upper and lower  respiratory specimens during the acute phase of infection. The lowest  concentration of SARS-CoV-2 viral copies this assay can detect is 250  copies / mL. A negative result does not  preclude SARS-CoV-2 infection  and should not be used as the sole basis for treatment or other  patient management decisions.  A negative result may occur with  improper specimen collection / handling, submission of specimen other  than nasopharyngeal swab, presence of viral mutation(s) within the  areas targeted by this assay, and inadequate number of viral copies  (<250 copies / mL). A negative result must be combined with clinical  observations, patient history, and epidemiological information. If result is POSITIVE SARS-CoV-2 target nucleic acids are DETECTED. The SARS-CoV-2 RNA is generally detectable in upper and lower  respiratory specimens dur ing the acute phase of infection.  Positive  results are indicative of active infection with SARS-CoV-2.  Clinical  correlation with patient history and other diagnostic information is  necessary to determine patient infection status.  Positive results do  not rule out bacterial infection or co-infection with other viruses. If result  is PRESUMPTIVE POSTIVE SARS-CoV-2 nucleic acids MAY BE PRESENT.   A presumptive positive result was obtained on the submitted specimen  and confirmed on repeat testing.  While 2019 novel coronavirus  (SARS-CoV-2) nucleic acids may be present in the submitted sample  additional confirmatory testing may be necessary for epidemiological  and / or clinical management purposes  to differentiate between  SARS-CoV-2 and other Sarbecovirus currently known to infect humans.  If clinically indicated additional testing with an alternate test  methodology [phone]) is advised. The SARS-CoV-2 RNA is generally  detectable in upper and lower respiratory sp ecimens during the acute  phase of infection. The expected result is Negative. Fact Sheet for Patients:  BoilerBrush.com.cy Fact Sheet for Healthcare Providers: https://pope.com/ This test is not yet approved or cleared by  the [place] FDA and has been authorized for detection and/or diagnosis of SARS-CoV-2 by FDA under an Emergency Use Authorization (EUA).  This EUA will remain in effect (meaning this test can be used) for the duration of the COVID-19 declaration under Section 564(b)(1) of the Act, 21 U.S.C. section 360bbb-3(b)(1), unless the authorization is terminated or revoked sooner. Performed at [facility], [address] [place], [place] [address]   Pregnancy, urine POC     Status: None   Collection Time: 08/02/19 12:52 AM  Result Value Ref Range   Preg Test, Ur NEGATIVE NEGATIVE    Comment:        THE SENSITIVITY OF THIS METHODOLOGY IS >24 mIU/mL     Current Facility-Administered Medications  Medication Dose Route Frequency Provider Last Rate Last Dose  . haloperidol (HALDOL) tablet 2 mg  2 mg Oral BID Charm Rings, NP       No current outpatient medications on file.    Musculoskeletal: Strength & Muscle Tone: within normal limits Gait & Station: normal Patient leans: N/A  Psychiatric Specialty Exam: Physical Exam  Nursing note and vitals reviewed. Constitutional: She is oriented to person, place, and time. She appears well-developed and well-nourished.  HENT:  Head: Normocephalic.  Neck: Normal range of motion.  Respiratory: Effort normal.  Musculoskeletal: Normal range of motion.  Neurological: She is alert and oriented to person, place, and time.  Psychiatric: Judgment normal. Her mood appears anxious. Her affect is blunt. Her speech is tangential. Thought content is paranoid and delusional. Cognition and memory are impaired. She is inattentive.    Review of Systems  Psychiatric/Behavioral: The patient is nervous/anxious and has insomnia.   All other systems reviewed and are negative.   Blood pressure (!) 147/99, pulse 91, temperature 99 F (37.2 C), temperature source Oral, resp. rate 18, height  (1.626 m), weight (!) 138.3 kg, SpO2 97 %.Body mass  index is 52.35 kg/m.  General Appearance: Disheveled  Eye Contact:  Fair  Speech:  Normal Rate  Volume:  Normal  Mood:  Anxious  Affect:  Blunt  Thought Process:  Descriptions of Associations: Tangential  Orientation:  Other:  person  Thought Content:  Delusions and Paranoid Ideation  Suicidal Thoughts:  No  Homicidal Thoughts:  No  Memory:  Immediate;   Poor Recent;   Poor Remote;   Fair  Judgement:  Impaired  Insight:  Lacking  Psychomotor Activity:  Decreased  Concentration:  Concentration: Fair and Attention Span: Poor  Recall:  Poor  Fund of Knowledge:  Fair  Language:  Fair  Akathisia:  No  Handed:  Right  AIMS (if indicated):     Assets:  Intimacy Physical Health  Resilience Social Support  ADL's:  Intact  Cognition:  Impaired,  Mild  Sleep:        Treatment Plan Summary: Daily contact with patient to assess and evaluate symptoms and progress in treatment, Medication management and Plan Psychosis, paranoid:  -Started Haldol 2 mg twice daily -Recommend inpatient hospitalization for stabilization  Disposition: Recommend psychiatric Inpatient admission when medically cleared.  Nanine Means, NP 08/02/2019 10:15 AM\  Case discussed and plan agreed upon as outlined above.

## 2019-08-02 NOTE — BH Assessment (Signed)
Pt was just settling down and getting to sleep after being agitated and being given 2mg  of Ativan, per Catalina Antigua, Therapist, sports. It was requested to wait until pt wakes on her own or until morning when pt is less agitated to complete assessment. Clinician will check in later to determine if pt is alert and able to participate in completing the assessment.

## 2019-08-02 NOTE — ED Provider Notes (Signed)
-----------------------------------------   6:05 AM on 08/02/2019 -----------------------------------------   Blood pressure (!) 147/99, pulse 91, temperature 99 F (37.2 C), temperature source Oral, resp. rate 18, height 5\' 4"  (1.626 m), weight (!) 138.3 kg, SpO2 97 %.  The patient is sleeping at this time.  There have been no acute events since the last update.  Awaiting disposition plan from Behavioral Medicine and/or Social Work team(s).   Paulette Blanch, MD 08/02/19 [phone]

## 2019-08-02 NOTE — ED Notes (Signed)
Hourly rounding reveals patient sleeping in room. No complaints, stable, in no acute distress. Q15 minute rounds and monitoring via Security Cameras to continue. 

## 2019-08-02 NOTE — ED Notes (Signed)
Patient talking to TTS 

## 2019-08-02 NOTE — ED Notes (Signed)
Patient mother called, ED tech informed mother we will have patient call her back when she wakes up

## 2019-08-02 NOTE — BH Assessment (Addendum)
Referral information for Psychiatric Hospitalization faxed to;   [facility] ([phone]), No beds  . [facility] [phone]),  . [facility] [phone])  . [facility] [phone]),  . [facility] [phone], [phone], [phone] or [phone]),   . [place] [facility] [phone] or [phone])  . [facility] [phone]),   . [facility] [place] [phone] -[phone]), patient information is been reviewed. Requesting updated Potassium Level  . [facility] ([phone]) patient information is been reviewed. Requesting updated Potassium Level

## 2019-08-02 NOTE — ED Notes (Signed)
Pt. Introduced to unit.  It took several attempts to direct patient to her room.  Pt. Shown where bathroom was, but patient continued to try to get into shower room.  Pt. Redirected to bathroom, where patient blew nose.  Pt. Needed assistance to get back to room.  Pt. In room laying on bed with light on.

## 2019-08-02 NOTE — BH Assessment (Signed)
Assessment Note  [name] is an 33 y.o. female who presents to the ER due to her family having concerns about her current mental and emotional state. Per the patient's mother ([name]), she noticed a slight change with the patient approximately two years ago when she starting having trouble with her reproductive system. She had to have two surgeries and other procedures. Since then she's been depressed, distant and withdrawn. However, she was able to manage and function without any problem. Mother further reports, approximately a month ago, she declined more. She became more withdrawn and guarded. She thought people were trying to hurt her and "do evil against her."  She now saying people are trying to kill her and she responding to internal stimuli. When her mother and sister tried speaking with her about their concerns, she told them she didn't have any family and they were lying to her about being related. Thus, her mother and sister called law enforcement for help. The mother voiced her concerned about not knowing what the patient may do to herself or her children because of her mental state. Patient have three children, ages; three, 36 and 59 years old. Children are safe with the mother and patient's sister.  During the interview, the patient was pleasant and cooperative. She attempted to answer questions but her answers did not relate the questions. She shared, when she was sleeping her spirit leaves her body but she's not sleep.  Patient have no history of aggression or violence. She has no involvement with the legal system. She denies the use of mind-altering substances and her labs reflect the same.   Diagnosis: Psychosis Unspecified   Past Medical History:  Past Medical History:  Diagnosis Date  . Abdominal pain affecting pregnancy, antepartum 02/28/2016  . Anemia    LAST HGB 04-03-17 12.5  . Anxiety   . Depression   . Gallstones 2018  . Incisional ventral hernia  w obstruction   . Obesity 09/15/13   Last Assessment & Plan:  Obesity in pregnancy is associated with maternal and obstetric complications including increased risk of gestational DM, gestational HTN, preeclampsia, thromboembolism, failed induction of labor, Cesarean section, excessive gestational weight gain and post-partum weight retention. Obesity is also associated with fetal complications including prematurity, stillbirth, congen  . Post-operative state 06-17-2016  . SIDS (sudden infant death syndrome) 2013-09-15   Last Assessment & Plan:  Ms. [name] third son died at three months of age from SIDS. I reviewed with Ms. [name] that the cause of SIDS is unknown. I did review with her that placing infants to sleep on their backs without soft bedding and tobacco cessation are potentially modifiable risk factors to decrease her risk of SIDS.  Marland Kitchen Small bowel obstruction (HCC)   . Supervision of high risk pregnancy in third trimester 09/15/2013   Last Assessment & Plan:  Ms. [name] is receiving her care at [facility]. She reports having an appointment tomorrow 12/3. I encouraged her to get a flu and TDAP vaccination. She is due for her CBC, RPR, third trimester HIV and glucola.   . Tobacco abuse Sep 15, 2013   Last Assessment & Plan:  I discussed with Ms. [name] that the risks of smoking in pregnancy include spontaneous pregnancy loss, placental abruption, preterm premature rupture of membranes (PPROM), placenta previa, preterm labor and delivery, and low birth weight (LBW) as well as SIDS. I discussed with her that her tobacco use is a modifiable risk factor for risk of placental abruption and SIDS,  b    Past Surgical History:  Procedure Laterality Date  . CESAREAN SECTION N/A 06/12/2016   Procedure: STAT CESAREAN SECTION WITH BILATERAL STERILIZATION;  Surgeon: Suzy Bouchard, MD;  Location: [facility] ORS;  Service: Obstetrics;  Laterality: N/A;  Baby Girl @ 2246 Weight 5 lb 10 oz Apgars 8/9  .  ENDOSCOPIC RETROGRADE CHOLANGIOPANCREATOGRAPHY (ERCP) WITH PROPOFOL N/A 06/06/2017   Procedure: ENDOSCOPIC RETROGRADE CHOLANGIOPANCREATOGRAPHY (ERCP) WITH PROPOFOL;  Surgeon: Midge Minium, MD;  Location: [facility] ENDOSCOPY;  Service: Endoscopy;  Laterality: N/A;  . ERCP N/A 10/30/2017   Procedure: ENDOSCOPIC RETROGRADE CHOLANGIOPANCREATOGRAPHY (ERCP) STENT REMOVAL;  Surgeon: Midge Minium, MD;  Location: [facility] ENDOSCOPY;  Service: Endoscopy;  Laterality: N/A;  . INCISIONAL HERNIA REPAIR N/A 05/07/2017   Procedure: LAPAROSCOPIC INCISIONAL HERNIA;  Surgeon: Henrene Dodge, MD;  Location: [facility] ORS;  Service: General;  Laterality: N/A;  . TUBAL LIGATION    . VENTRAL HERNIA REPAIR N/A 06/19/2016   Procedure: HERNIA REPAIR VENTRAL ADULT;  Surgeon: Lattie Haw, MD;  Location: [facility] ORS;  Service: General;  Laterality: N/A;    Family History:  Family History  Problem Relation Age of Onset  . Diabetes Mother   . Asthma Sister   . Hypertension Maternal Aunt   . Hypertension Maternal Uncle     Social History:  reports that she has been smoking cigarettes. She has a 15.00 pack-year smoking history. She has never used smokeless tobacco. She reports current alcohol use of about 7.0 standard drinks of alcohol per week. She reports current drug use.  Additional Social History:  Alcohol / Drug Use Pain Medications: See PTA Prescriptions: See PTA Over the Counter: See PTA History of alcohol / drug use?: No history of alcohol / drug abuse Longest period of sobriety (when/how long): Reports of none  CIWA: CIWA-Ar BP: (!) 147/99 Pulse Rate: 91 COWS:    Allergies:  Allergies  Allergen Reactions  . Amoxicillin   . Vicodin [Hydrocodone-Acetaminophen]     Home Medications: (Not in a hospital admission)   OB/GYN Status:  No LMP recorded (lmp unknown).  General Assessment Data Assessment unable to be completed: Yes Reason for not completing assessment: Pt is currently sleeping and had a difficult time  settling in, so it was determined that pt should be permitted to sleep for as long as possible prior to be woken up. [facility] of Assessment: [facility] ED TTS Assessment: In system Is this a Tele or Face-to-Face Assessment?: Face-to-Face Is this an Initial Assessment or a Re-assessment for this encounter?: Initial Assessment Patient Accompanied by:: N/A Language Other than English: No Living Arrangements: Other (Comment)(Private Home) What gender do you identify as?: Female Marital status: Single Pregnancy Status: No Living Arrangements: Children(Three children live in the home with her) Can pt return to current living arrangement?: Yes Admission Status: Voluntary Is patient capable of signing voluntary admission?: Yes Referral Source: Self/Family/Friend Insurance type: Medicaid  Medical Screening Exam [facility] Walk-in ONLY) Medical Exam completed: Yes  Crisis Care Plan Living Arrangements: Children(Three children live in the home with her) Legal Guardian: Other:(Self) Name of Psychiatrist: Reports of none Name of Therapist: Reports of none  Education Status Is patient currently in school?: No Is the patient employed, unemployed or receiving disability?: Unemployed  Risk to self with the past 6 months Suicidal Ideation: No Has patient been a risk to self within the past 6 months prior to admission? : No Suicidal Intent: No Has patient had any suicidal intent within the past 6 months prior to admission? : No  Is patient at risk for suicide?: No Suicidal Plan?: No Has patient had any suicidal plan within the past 6 months prior to admission? : No Access to Means: No What has been your use of drugs/alcohol within the last 12 months?: Reports of none Previous Attempts/Gestures: No Other Self Harm Risks: Reports of none Triggers for Past Attempts: None known Intentional Self Injurious Behavior: None Family Suicide History: No Recent stressful life event(s): Other (Comment), Loss  (Comment) Persecutory voices/beliefs?: No Depression: Yes Depression Symptoms: Tearfulness, Isolating, Fatigue, Loss of interest in usual pleasures, Feeling worthless/self pity Substance abuse history and/or treatment for substance abuse?: No Suicide prevention information given to non-admitted patients: Not applicable  Risk to Others within the past 6 months Homicidal Ideation: No Does patient have any lifetime risk of violence toward others beyond the six months prior to admission? : No Thoughts of Harm to Others: No Current Homicidal Intent: No Current Homicidal Plan: No Access to Homicidal Means: No Identified Victim: Reports of none History of harm to others?: No Assessment of Violence: None Noted Violent Behavior Description: Reports of none Does patient have access to weapons?: No Does patient have a court date: No Is patient on probation?: No  Psychosis Hallucinations: Auditory(Per patient's mother) Delusions: Grandiose, Persecutory  Mental Status Report Appearance/Hygiene: Unremarkable, In scrubs Eye Contact: Fair Motor Activity: Freedom of movement, Unremarkable Speech: Pressured, Soft, Unremarkable Level of Consciousness: Alert Mood: Anxious, Preoccupied, Pleasant Affect: Anxious, Appropriate to circumstance, Sad, Depressed Anxiety Level: Moderate Thought Processes: Irrelevant, Coherent Judgement: Partial Orientation: Person, Place, Time, Situation, Appropriate for developmental age Obsessive Compulsive Thoughts/Behaviors: Minimal  Cognitive Functioning Concentration: Normal Memory: Remote Intact, Recent Impaired Is patient IDD: No Insight: Poor Impulse Control: Fair Appetite: Good Have you had any weight changes? : No Change Sleep: Unable to Assess Vegetative Symptoms: None  ADLScreening [facility]) Patient's cognitive ability adequate to safely complete daily activities?: Yes Patient able to express need for assistance with ADLs?:  Yes Independently performs ADLs?: Yes (appropriate for developmental age)  Prior Inpatient Therapy Prior Inpatient Therapy: No  Prior Outpatient Therapy Prior Outpatient Therapy: No Does patient have an ACCT team?: No Does patient have Intensive In-House Services?  : No Does patient have [facility] services? : No Does patient have P4CC services?: No  ADL Screening (condition at time of admission) Patient's cognitive ability adequate to safely complete daily activities?: Yes Is the patient deaf or have difficulty hearing?: No Does the patient have difficulty seeing, even when wearing glasses/contacts?: No Does the patient have difficulty concentrating, remembering, or making decisions?: No Patient able to express need for assistance with ADLs?: Yes Does the patient have difficulty dressing or bathing?: No Independently performs ADLs?: Yes (appropriate for developmental age) Does the patient have difficulty walking or climbing stairs?: No Weakness of Legs: None Weakness of Arms/Hands: None  Home Assistive Devices/Equipment Home Assistive Devices/Equipment: None  Therapy Consults (therapy consults require a physician order) PT Evaluation Needed: No OT Evalulation Needed: No SLP Evaluation Needed: No Abuse/Neglect Assessment (Assessment to be complete while patient is alone) Abuse/Neglect Assessment Can Be Completed: Yes Physical Abuse: Denies Verbal Abuse: Denies Sexual Abuse: Denies Exploitation of patient/patient's resources: Denies Self-Neglect: Denies Values / Beliefs Cultural Requests During Hospitalization: None Spiritual Requests During Hospitalization: None Consults Spiritual Care Consult Needed: No Social Work Consult Needed: No Merchant navy officerAdvance Directives (For Healthcare) Does Patient Have a Medical Advance Directive?: No Would patient like information on creating a medical advance directive?: No - Patient declined  Child/Adolescent Assessment Running Away Risk:  Denies(Patient is an adult)  Disposition:  Disposition Initial Assessment Completed for this Encounter: Yes  On Site Evaluation by:   Reviewed with Physician:    Gunnar Fusi MS, LCAS, [facility], [place] Therapeutic Triage Specialist 08/02/2019 11:45 AM

## 2019-08-02 NOTE — BH Assessment (Signed)
Writer spoke with patient's nurse, Donneta Romberg and informed her two hospitals are reviewing patient but have concerns about her potassium level. She will talk with the ER MD and get it resolved.

## 2019-08-03 MED ORDER — NICOTINE 21 MG/24HR TD PT24
21.0000 mg | MEDICATED_PATCH | Freq: Once | TRANSDERMAL | Status: DC
  Administered 2019-08-03: 21 mg via TRANSDERMAL
  Filled 2019-08-03: qty 1

## 2019-08-03 MED ORDER — IBUPROFEN 600 MG PO TABS
600.0000 mg | ORAL_TABLET | Freq: Once | ORAL | Status: AC
  Administered 2019-08-03: 04:00:00 600 mg via ORAL
  Filled 2019-08-03: qty 1

## 2019-08-03 MED ORDER — IBUPROFEN 600 MG PO TABS
600.0000 mg | ORAL_TABLET | Freq: Once | ORAL | Status: AC
  Administered 2019-08-03: 600 mg via ORAL
  Filled 2019-08-03: qty 1

## 2019-08-03 NOTE — ED Notes (Signed)
Called [facility] for transport to [place]

## 2019-08-03 NOTE — ED Notes (Signed)
BEHAVIORAL HEALTH ROUNDING Patient sleeping: No. Patient alert and oriented: yes Behavior appropriate: Yes.  ; If no, describe:  Nutrition and fluids offered: yes Toileting and hygiene offered: Yes  Sitter present: q15 minute observations and security camera monitoring   

## 2019-08-03 NOTE — ED Provider Notes (Signed)
-----------------------------------------   6:49 AM on 08/03/2019 -----------------------------------------   Blood pressure 112/68, pulse 82, temperature 98 F (36.7 C), temperature source Oral, resp. rate 18, height 5\' 4"  (1.626 m), weight (!) 138.3 kg, SpO2 99 %.  The patient is calm and cooperative at this time.  There have been no acute events since the last update.  Awaiting disposition plan from Behavioral Medicine and/or Social Work team(s).    Harvest Dark, MD 08/03/19 [id number]

## 2019-08-03 NOTE — ED Notes (Signed)

## 2019-08-03 NOTE — ED Notes (Signed)
Patient observed lying in bed with eyes closed  Even, unlabored respirations observed   NAD pt appears to be sleeping  I will continue to monitor along with every 15 minute visual observations and ongoing security camera monitoring    

## 2019-08-03 NOTE — ED Notes (Signed)
emtala reviewed by this RN 

## 2019-08-03 NOTE — ED Notes (Signed)
Patient has been accepted to [facility].  Patient assigned to room [place] Accepting physician is Dr. Reece Levy  Call report to [phone].  Representative was [place].   ER Staff is aware of it:  ER Secretary  Dr. ER MD  [facility]  Patient's Nurse     Pt can be transferred anytime AFTER 12 She is to report to the [facility] building to complete Temperature check

## 2019-08-03 NOTE — ED Notes (Signed)
ED B[facility] [place] Is the patient under IVC or is there intent for IVC: Yes.   Is the patient medically cleared: Yes.   Is there vacancy in the ED BHU: Yes.   Is the population mix appropriate for patient: Yes.   Is the patient awaiting placement in inpatient or outpatient setting: Yes.  She is pending [facility]  Has the patient had a psychiatric consult: Yes.   Survey of unit performed for contraband, proper placement and condition of furniture, tampering with fixtures in bathroom, shower, and each patient room: Yes.  ; Findings:  APPEARANCE/BEHAVIOR Calm and cooperative NEURO ASSESSMENT Orientation: alert and oriented to self place and time    Hallucinations: No.None noted (Hallucinations) denies at this time  Speech: Normal Gait: normal RESPIRATORY ASSESSMENT Even  Unlabored respirations  CARDIOVASCULAR ASSESSMENT Pulses equal   regular rate  Skin warm and dry   GASTROINTESTINAL ASSESSMENT no GI complaint EXTREMITIES Full ROM  PLAN OF CARE Provide calm/safe environment. Vital signs assessed twice daily. ED BHU Assessment once each 12-hour shift. Collaborate with TTS daily or as condition indicates. Assure the ED provider has rounded once each shift. Provide and encourage hygiene. Provide redirection as needed. Assess for escalating behavior; address immediately and inform ED provider.  Assess family dynamic and appropriateness for visitation as needed: Yes.  ; If necessary, describe findings:  Educate the patient/family about BHU procedures/visitation: Yes.  ; If necessary, describe findings:

## 2019-08-03 NOTE — ED Notes (Signed)
Pt. Woke and requested to use bathroom.  Pt. Also indicated her back was sore.  MD notified orders issued.

## 2019-08-03 NOTE — ED Notes (Signed)
BEHAVIORAL HEALTH ROUNDING Patient sleeping: Yes.   Patient alert and oriented: eyes closed  Appears asleep Behavior appropriate: Yes.  ; If no, describe:  Nutrition and fluids offered: Yes  Toileting and hygiene offered: sleeping Sitter present: q 15 minute observations and security camera monitoring  

## 2019-08-22 ENCOUNTER — Other Ambulatory Visit: Payer: Self-pay

## 2019-08-22 DIAGNOSIS — F17210 Nicotine dependence, cigarettes, uncomplicated: Secondary | ICD-10-CM | POA: Insufficient documentation

## 2019-08-22 DIAGNOSIS — F250 Schizoaffective disorder, bipolar type: Secondary | ICD-10-CM | POA: Diagnosis not present

## 2019-08-22 DIAGNOSIS — R440 Auditory hallucinations: Secondary | ICD-10-CM | POA: Diagnosis present

## 2019-08-22 NOTE — ED Triage Notes (Signed)
Pt presents via [facility] with IVC. Reports hearing voices and having SI. + psych hx. [facility] at bedside.

## 2019-08-23 ENCOUNTER — Emergency Department
Admission: EM | Admit: 2019-08-23 | Discharge: 2019-08-23 | Disposition: A | Discharge status: Home / Self Care | Payer: Medicaid Other | Attending: Emergency Medicine | Admitting: Emergency Medicine

## 2019-08-23 DIAGNOSIS — F23 Brief psychotic disorder: Secondary | ICD-10-CM

## 2019-08-23 DIAGNOSIS — F250 Schizoaffective disorder, bipolar type: Secondary | ICD-10-CM | POA: Diagnosis present

## 2019-08-23 LAB — CBC
HCT: 41.8 % (ref 36.0–46.0)
Hemoglobin: 14.5 g/dL (ref 12.0–15.0)
MCH: 27.2 pg (ref 26.0–34.0)
MCHC: 34.7 g/dL (ref 30.0–36.0)
MCV: 78.4 fL — ABNORMAL LOW (ref 80.0–100.0)
Platelets: 503 10*3/uL — ABNORMAL HIGH (ref 150–400)
RBC: 5.33 MIL/uL — ABNORMAL HIGH (ref 3.87–5.11)
RDW: 13.7 % (ref 11.5–15.5)
WBC: 14.5 10*3/uL — ABNORMAL HIGH (ref 4.0–10.5)
nRBC: 0 % (ref 0.0–0.2)

## 2019-08-23 LAB — COMPREHENSIVE METABOLIC PANEL
ALT: 37 U/L (ref 0–44)
AST: 58 U/L — ABNORMAL HIGH (ref 15–41)
Albumin: 4 g/dL (ref 3.5–5.0)
Alkaline Phosphatase: 73 U/L (ref 38–126)
Anion gap: 13 (ref 5–15)
BUN: <5 mg/dL — ABNORMAL LOW (ref 6–20)
CO2: 20 mmol/L — ABNORMAL LOW (ref 22–32)
Calcium: 9.1 mg/dL (ref 8.9–10.3)
Chloride: 104 mmol/L (ref 98–111)
Creatinine, Ser: 0.52 mg/dL (ref 0.44–1.00)
GFR calc Af Amer: >60 mL/min (ref >60)
GFR calc non Af Amer: >60 mL/min (ref >60)
Glucose, Bld: 123 mg/dL — ABNORMAL HIGH (ref 70–99)
Potassium: 3.3 mmol/L — ABNORMAL LOW (ref 3.5–5.1)
Sodium: 137 mmol/L (ref 135–145)
Total Bilirubin: 0.4 mg/dL (ref 0.3–1.2)
Total Protein: 8.9 g/dL — ABNORMAL HIGH (ref 6.5–8.1)

## 2019-08-23 LAB — URINE DRUG SCREEN, QUALITATIVE (ARMC ONLY)
Amphetamines, Ur Screen: NOT DETECTED
Barbiturates, Ur Screen: NOT DETECTED
Benzodiazepine, Ur Scrn: NOT DETECTED
Cannabinoid 50 Ng, Ur ~~LOC~~: NOT DETECTED
Cocaine Metabolite,Ur ~~LOC~~: NOT DETECTED
MDMA (Ecstasy)Ur Screen: NOT DETECTED
Methadone Scn, Ur: NOT DETECTED
Opiate, Ur Screen: NOT DETECTED
Phencyclidine (PCP) Ur S: NOT DETECTED
Tricyclic, Ur Screen: NOT DETECTED

## 2019-08-23 LAB — POCT PREGNANCY, URINE: Preg Test, Ur: NEGATIVE

## 2019-08-23 LAB — ETHANOL: Alcohol, Ethyl (B): 187 mg/dL — ABNORMAL HIGH (ref <10)

## 2019-08-23 LAB — ACETAMINOPHEN LEVEL: Acetaminophen (Tylenol), Serum: <10 ug/mL — ABNORMAL LOW (ref 10–30)

## 2019-08-23 LAB — SALICYLATE LEVEL: Salicylate Lvl: <7 mg/dL (ref 2.8–30.0)

## 2019-08-23 MED ORDER — TRAZODONE HCL 50 MG PO TABS: 150.0000 mg | ORAL_TABLET | Freq: Every day | ORAL | Status: DC

## 2019-08-23 MED ORDER — RISPERIDONE 1 MG PO TABS: 1.0000 mg | ORAL_TABLET | Freq: Two times a day (BID) | ORAL | Status: DC

## 2019-08-23 NOTE — Consult Note (Addendum)
[facility]  08/23/2019 3:55 PM [name] Santiago  MRN:  [id number] Principal Problem: Schizoaffective disorder, bipolar type [facility](HCC) Discharge Diagnoses: Principal Problem:   Schizoaffective disorder, bipolar type (HCC)  Subjective: "I'm good now.  I was upset last night but not anymore.  I want to go stay with my mom who is keeping my kids."   Patient seen and evaluated in person by this provider.  She reports having some paranoia last night with some suicidal thoughts but no longer has any of the symptoms.  She request to leave and go stay with her mother who is currently taking care of her 3 children.  Denies past history of self-harm.  She does have some slight paranoia that her house may be haunted and will stay with her mother.  Her mother was, contacted and return the call to the nurse who stated she does not have any safety concerns for her daughter and would like for her to be discharged to her.  She was recently diagnosed with schizophrenia and was at [facility] and taking her meds selectively as she wanted to see what the side effects were and making sure they do not interfere with taking care of her children.  Encouraged her to take her medication as directed and to follow-up with her outpatient provider, psychiatrically cleared  HPI per TTS:  [name] [name] is an 33 y.o. female. Who presents Involuntarily to the ER with c/o SI and paranoia.Pt with mental health history as states below. Shealso report her current living enviorement ( black mold in home) as a stressor which trigger thoughts of paranoia. [facility]. She state that she has yet to follow up with an outpatient provider but does have medication that was provided at discharge. She self reports compliance most of the time. Pt denied the use of any illicit drugs but states taht she drinks alcohol daily (3-4 40oz beers) .Pt has a history of Schizophrenia  and  shares that  she is compliant most of the time. Pt endorses hearing auditory hallucinations She states that they are command in nature and often tell her to kill herself. Ptis able tocontract for safety outside of the hospital system and denied current SI. Reports that since arriving to the emergency department the symptoms have resolved. She now denies SI/HI or hallucinations. Patient denies any other medical complaints. A behavioral health assessment has been completed including evaluation of the patient, collecting collateral history:, reviewing available medical/clinic records, evaluating his unique risk and protective factors, and discussing treatment recommendations.  Total Time spent with patient: 1 hour  Past Psychiatric History: schizophrenia  Past Medical History:  Past Medical History:  Diagnosis Date  . Abdominal pain affecting pregnancy, antepartum 02/28/2016  . Anemia    LAST HGB 04-03-17 12.5  . Anxiety   . Depression   . Gallstones 2018  . Incisional ventral hernia w obstruction   . Obesity 10/05/2013   Last Assessment & Plan:  Obesity in pregnancy is associated with maternal and obstetric complications including increased risk of gestational DM, gestational HTN, preeclampsia, thromboembolism, failed induction of labor, Cesarean section, excessive gestational weight gain and post-partum weight retention. Obesity is also associated with fetal complications including prematurity, stillbirth, congen  . Post-operative state 06/13/2016  . SIDS (sudden infant death syndrome) 09/12/2013   Last Assessment & Plan:  [name]'s third son died at three months of age from SIDS. I reviewed with [name] that the  cause of SIDS is unknown. I did review with her that placing infants to sleep on their backs without soft bedding and tobacco cessation are potentially modifiable risk factors to decrease her risk of SIDS.  Marland Kitchen Small bowel obstruction (HCC)   . Supervision of high risk pregnancy in third trimester  Sep 25, 2013   Last Assessment & Plan:  [name] is receiving her care at [facility]. She reports having an appointment tomorrow 12/3. I encouraged her to get a flu and TDAP vaccination. She is due for her CBC, RPR, third trimester HIV and glucola.   . Tobacco abuse September 25, 2013   Last Assessment & Plan:  I discussed with [name] that the risks of smoking in pregnancy include spontaneous pregnancy loss, placental abruption, preterm premature rupture of membranes (PPROM), placenta previa, preterm labor and delivery, and low birth weight (LBW) as well as SIDS. I discussed with her that her tobacco use is a modifiable risk factor for risk of placental abruption and SIDS, b    Past Surgical History:  Procedure Laterality Date  . CESAREAN SECTION N/A 06/12/2016   Procedure: STAT CESAREAN SECTION WITH BILATERAL STERILIZATION;  Surgeon: Suzy Bouchard, MD;  Location: [facility] ORS;  Service: Obstetrics;  Laterality: N/A;  Baby Girl @ 2246 Weight 5 lb 10 oz Apgars 8/9  . ENDOSCOPIC RETROGRADE CHOLANGIOPANCREATOGRAPHY (ERCP) WITH PROPOFOL N/A 06/06/2017   Procedure: ENDOSCOPIC RETROGRADE CHOLANGIOPANCREATOGRAPHY (ERCP) WITH PROPOFOL;  Surgeon: Midge Minium, MD;  Location: [facility] ENDOSCOPY;  Service: Endoscopy;  Laterality: N/A;  . ERCP N/A 10/30/2017   Procedure: ENDOSCOPIC RETROGRADE CHOLANGIOPANCREATOGRAPHY (ERCP) STENT REMOVAL;  Surgeon: Midge Minium, MD;  Location: [facility] ENDOSCOPY;  Service: Endoscopy;  Laterality: N/A;  . INCISIONAL HERNIA REPAIR N/A 05/07/2017   Procedure: LAPAROSCOPIC INCISIONAL HERNIA;  Surgeon: Henrene Dodge, MD;  Location: [facility] ORS;  Service: General;  Laterality: N/A;  . TUBAL LIGATION    . VENTRAL HERNIA REPAIR N/A 06/19/2016   Procedure: HERNIA REPAIR VENTRAL ADULT;  Surgeon: Lattie Haw, MD;  Location: [facility] ORS;  Service: General;  Laterality: N/A;   Family History:  Family History  Problem Relation Age of Onset  . Diabetes Mother   . Asthma Sister   . Hypertension  Maternal Aunt   . Hypertension Maternal Uncle    Family Psychiatric  History: none Social History:  Social History   Substance and Sexual Activity  Alcohol Use Yes  . Alcohol/week: 7.0 standard drinks  . Types: 7 Cans of beer per week   Comment: 2 40oz/day     Social History   Substance and Sexual Activity  Drug Use Yes   Comment: "pills"    Social History   Socioeconomic History  . Marital status: Single    Spouse name: Not on file  . Number of children: Not on file  . Years of education: Not on file  . Highest education level: Not on file  Occupational History  . Not on file  Social Needs  . Financial resource strain: Not on file  . Food insecurity    Worry: Not on file    Inability: Not on file  . Transportation needs    Medical: Not on file    Non-medical: Not on file  Tobacco Use  . Smoking status: Current Every Day Smoker    Packs/day: 1.50    Years: 10.00    Pack years: 15.00    Types: Cigarettes  . Smokeless tobacco: Never Used  Substance and Sexual Activity  . Alcohol use: Yes  Alcohol/week: 7.0 standard drinks    Types: 7 Cans of beer per week    Comment: 2 40oz/day  . Drug use: Yes    Comment: "pills"  . Sexual activity: Never  Lifestyle  . Physical activity    Days per week: Not on file    Minutes per session: Not on file  . Stress: Not on file  Relationships  . Social Musician on phone: Not on file    Gets together: Not on file    Attends religious service: Not on file    Active member of club or organization: Not on file    Attends meetings of clubs or organizations: Not on file    Relationship status: Not on file  Other Topics Concern  . Not on file  Social History Narrative  . Not on file    Has this patient used any form of tobacco in the last 30 days? (Cigarettes, Smokeless Tobacco, Cigars, and/or Pipes) NA  Current Medications: No current facility-administered medications for this encounter.    Current  Outpatient Medications  Medication Sig Dispense Refill  . risperiDONE (RISPERDAL) 1 MG tablet Take 1 mg by mouth 2 (two) times daily.    . traZODone (DESYREL) 150 MG tablet Take 150 mg by mouth at bedtime.     PTA Medications: (Not in a hospital admission)   Musculoskeletal: Strength & Muscle Tone: within normal limits Gait & Station: normal Patient leans: N/A  Psychiatric Specialty Exam: Physical Exam  Nursing note and vitals reviewed. Constitutional: She is oriented to person, place, and time. She appears well-developed and well-nourished.  HENT:  Head: Normocephalic.  Neck: Normal range of motion.  Respiratory: Effort normal.  Musculoskeletal: Normal range of motion.  Neurological: She is alert and oriented to person, place, and time.  Psychiatric: Her speech is normal and behavior is normal. Judgment and thought content normal. Her mood appears anxious. Her affect is blunt. Cognition and memory are normal.    Review of Systems  Psychiatric/Behavioral: Positive for depression. The patient is nervous/anxious.   All other systems reviewed and are negative.   Blood pressure 139/83, pulse (!) 108, temperature 98.7 F (37.1 C), temperature source Oral, resp. rate 18, SpO2 97 %.There is no height or weight on file to calculate BMI.  General Appearance: Casual  Eye Contact:  Good  Speech:  Normal Rate  Volume:  Normal  Mood:  Anxious and Depressed  Affect:  Congruent  Thought Process:  Coherent and Descriptions of Associations: Intact  Orientation:  Full (Time, Place, and Person)  Thought Content:  WDL and Logical  Suicidal Thoughts:  No  Homicidal Thoughts:  No  Memory:  Immediate;   Good Recent;   Good Remote;   Good  Judgement:  Fair  Insight:  Fair  Psychomotor Activity:  Normal  Concentration:  Concentration: Good and Attention Span: Good  Recall:  Good  Fund of Knowledge:  Fair  Language:  Good  Akathisia:  No  Handed:  Right  AIMS (if indicated):     Assets:   Housing Leisure Time Physical Health Resilience Social Support  ADL's:  Intact  Cognition:  WNL  Sleep:        Demographic Factors:  NA  Loss Factors: NA  Historical Factors: NA  Risk Reduction Factors:   Responsible for children under 35 years of age, Sense of responsibility to family, Living with another person, especially a relative, Positive social support and Positive therapeutic relationship  Continued  Clinical Symptoms:  Depression and anxiety, mild  Cognitive Features That Contribute To Risk:  None    Suicide Risk:  Minimal: No identifiable suicidal ideation.  Patients presenting with no risk factors but with morbid ruminations; may be classified as minimal risk based on the severity of the depressive symptoms   Plan Of Care/Follow-up recommendations:  Schizophrenia, paranoid type: -Continue Risperdal 1 mg BID  Insomnia: -Continue Trazodone 150 mg at bedtime PRN Activity:  as tolerated Diet:  heart healthy diet  Disposition: discharge home Waylan Boga, NP 08/23/2019, 3:55 PM   Case discussed and plan agreed upon as outlined above.

## 2019-08-23 NOTE — ED Notes (Signed)
Pt given meal tray.

## 2019-08-23 NOTE — BH Assessment (Signed)
TTS attempted to reach [facility]/[phone] without success.  TTS attempted the second/mobile phone number [phone].  The phone rang at least t2 times with no voicemail.

## 2019-08-23 NOTE — BH Assessment (Signed)
Assessment Note  [name] is an 33 y.o. female. Who presents Involuntarily to the ER with c/o SI and paranoia. Pt with  mental health history as states below. She also report her current living enviorement ( black mold in home) as a stressor which trigger thoughts of paranoia. Pt recently discharged from [facility] due to similar complaints. She state that she has yet to follow up with an outpatient provider but does have medication that was provided at discharge. She self reports compliance most of the time. Pt denied the use of any illicit drugs but states taht she drinks alcohol daily (3-4 40oz beers) . Pt has a history of Schizophrenia  and  shares that she is compliant most of the time. Pt endorses hearing auditory hallucinations She states that they are command in nature and often tell her to kill herself. Pt is able to contract for safety outside of the hospital system and denied current SI. Reports that since arriving to the emergency department the symptoms have resolved. She now denies SI/HI or hallucinations. Patient denies any other medical complaints. A behavioral health assessment has been completed including evaluation of the patient, collecting collateral history:, reviewing available medical/clinic records, evaluating his unique risk and protective factors, and discussing treatment recommendations.  Diagnosis: Schizophrenia   Past Medical History:  Past Medical History:  Diagnosis Date  . Abdominal pain affecting pregnancy, antepartum 02/28/2016  . Anemia    LAST HGB 04-03-17 12.5  . Anxiety   . Depression   . Gallstones 2018  . Incisional ventral hernia w obstruction   . Obesity 10/08/13   Last Assessment & Plan:  Obesity in pregnancy is associated with maternal and obstetric complications including increased risk of gestational DM, gestational HTN, preeclampsia, thromboembolism, failed induction of labor, Cesarean section, excessive gestational weight gain and post-partum weight  retention. Obesity is also associated with fetal complications including prematurity, stillbirth, congen  . Post-operative state 10-Jul-2016  . SIDS (sudden infant death syndrome) 10-08-13   Last Assessment & Plan:  Ms. Strauch third son died at three months of age from [place]. I reviewed with Ms. Cathy that the cause of SIDS is unknown. I did review with her that placing infants to sleep on their backs without soft bedding and tobacco cessation are potentially modifiable risk factors to decrease her risk of SIDS.  Marland Kitchen Small bowel obstruction ([place])   . Supervision of high risk pregnancy in third trimester 08-Oct-2013   Last Assessment & Plan:  Ms. Person is receiving her care at [facility]. She reports having an appointment tomorrow 12/3. I encouraged her to get a flu and TDAP vaccination. She is due for her CBC, RPR, third trimester HIV and glucola.   . Tobacco abuse 2013/10/08   Last Assessment & Plan:  I discussed with Ms. Foley that the risks of smoking in pregnancy include spontaneous pregnancy loss, placental abruption, preterm premature rupture of membranes (PPROM), placenta previa, preterm labor and delivery, and low birth weight (LBW) as well as SIDS. I discussed with her that her tobacco use is a modifiable risk factor for risk of placental abruption and SIDS, b    Past Surgical History:  Procedure Laterality Date  . CESAREAN SECTION N/A 06/12/2016   Procedure: STAT CESAREAN SECTION WITH BILATERAL STERILIZATION;  Surgeon: Boykin Nearing, MD;  Location: [facility] ORS;  Service: Obstetrics;  Laterality: N/A;  Baby Girl @ 2246 Weight 5 lb 10 oz Apgars 8/9  . ENDOSCOPIC RETROGRADE CHOLANGIOPANCREATOGRAPHY (ERCP) WITH PROPOFOL N/A  06/06/2017   Procedure: ENDOSCOPIC RETROGRADE CHOLANGIOPANCREATOGRAPHY (ERCP) WITH PROPOFOL;  Surgeon: Midge MiniumWohl, Darren, MD;  [facility];  Service: Endoscopy;  Laterality: N/A;  . ERCP N/A 10/30/2017   Procedure: ENDOSCOPIC RETROGRADE  CHOLANGIOPANCREATOGRAPHY (ERCP) STENT REMOVAL;  Surgeon: Midge MiniumWohl, Darren, MD;  Location: [facility] ENDOSCOPY;  Service: Endoscopy;  Laterality: N/A;  . INCISIONAL HERNIA REPAIR N/A 05/07/2017   Procedure: LAPAROSCOPIC INCISIONAL HERNIA;  Surgeon: Henrene DodgePiscoya, Jose, MD;  Location: [facility] ORS;  Service: General;  Laterality: N/A;  . TUBAL LIGATION    . VENTRAL HERNIA REPAIR N/A 06/19/2016   Procedure: HERNIA REPAIR VENTRAL ADULT;  Surgeon: Lattie Hawichard E Cooper, MD;  Location: [facility] ORS;  Service: General;  Laterality: N/A;    Family History:  Family History  Problem Relation Age of Onset  . Diabetes Mother   . Asthma Sister   . Hypertension Maternal Aunt   . Hypertension Maternal Uncle     Social History:  reports that she has been smoking cigarettes. She has a 15.00 pack-year smoking history. She has never used smokeless tobacco. She reports current alcohol use of about 7.0 standard drinks of alcohol per week. She reports current drug use.  Additional Social History:  Alcohol / Drug Use Pain Medications: See PTA Prescriptions: See PTA Over the Counter: See PTA History of alcohol / drug use?: Yes Longest period of sobriety (when/how long): Reports of none Substance #1 Name of Substance 1: Alcohol 1 - Age of First Use: 20's 1 - Amount (size/oz): 2-3 40oz beers 1 - Frequency: daily 1 - Duration: ongoing 1 - Last Use / Amount: yesterday  CIWA: CIWA-Ar BP: (!) 147/94 Pulse Rate: 94 COWS:    Allergies:  Allergies  Allergen Reactions  . Amoxicillin   . Vicodin [Hydrocodone-Acetaminophen]     Home Medications: (Not in a hospital admission)   OB/GYN Status:  No LMP recorded (lmp unknown).  General Assessment Data [facility] Assessment: In system Is this a Tele or Face-to-Face Assessment?: Tele Assessment Is this an Initial Assessment or a Re-assessment for this encounter?: Initial Assessment Patient Accompanied by:: N/A Language Other than English: No Living  Arrangements: Other (Comment) What gender do you identify as?: Female Marital status: Single Pregnancy Status: No Living Arrangements: Children Can pt return to current living arrangement?: Yes Admission Status: Involuntary Is patient capable of signing voluntary admission?: No Referral Source: Other Insurance type: medicaid  Medical Screening Exam [facility]-in ONLY) Medical Exam completed: Yes  Crisis Care Plan Living Arrangements: Children Name of Psychiatrist: Reports of none Name of Therapist: Reports of none  Education Status Is patient currently in school?: No Is the patient employed, unemployed or receiving disability?: Unemployed  Risk to self with the past 6 months Suicidal Ideation: No Has patient been a risk to self within the past 6 months prior to admission? : No Suicidal Intent: No-Not Currently/Within Last 6 Months Has patient had any suicidal intent within the past 6 months prior to admission? : No Is patient at risk for suicide?: No, but patient needs Medical Clearance Suicidal Plan?: No Has patient had any suicidal plan within the past 6 months prior to admission? : No Access to Means: No What has been your use of drugs/alcohol within the last 12 months?: none Previous Attempts/Gestures: No Other Self Harm Risks: none Triggers for Past Attempts: None known Intentional Self Injurious Behavior: None Family Suicide History: No Persecutory voices/beliefs?: Yes Depression: Yes Depression Symptoms: Feeling worthless/self pity, Feeling angry/irritable, Insomnia Substance abuse history and/or treatment  for substance abuse?: No Suicide prevention information given to non-admitted patients: Not applicable  Risk to Others within the past 6 months Homicidal Ideation: No Does patient have any lifetime risk of violence toward others beyond the six months prior to admission? : No Thoughts of Harm to Others: No Current Homicidal Intent: No Current Homicidal Plan:  No Access to Homicidal Means: No Identified Victim: n History of harm to others?: No Assessment of Violence: None Noted Violent Behavior Description: none  Does patient have access to weapons?: No Criminal Charges Pending?: No Does patient have a court date: No Is patient on probation?: No  Psychosis Hallucinations: Visual, Auditory Delusions: Persecutory  Mental Status Report Appearance/Hygiene: Unremarkable, In scrubs Eye Contact: Poor Motor Activity: Freedom of movement Speech: Pressured, Soft, Unremarkable Level of Consciousness: Alert Mood: Anxious Affect: Anxious, Appropriate to circumstance, Sad, Depressed Anxiety Level: Minimal Thought Processes: Coherent Judgement: Partial Orientation: Person, Place, Time, Situation Obsessive Compulsive Thoughts/Behaviors: Minimal  Cognitive Functioning Concentration: Fair Memory: Remote Intact, Recent Intact Is patient IDD: No Insight: Poor Impulse Control: Fair Appetite: Good Have you had any weight changes? : No Change Sleep: No Change Total Hours of Sleep: 4 Vegetative Symptoms: None  ADLScreening [facility] Assessment Services) Patient's cognitive ability adequate to safely complete daily activities?: Yes Patient able to express need for assistance with ADLs?: Yes Independently performs ADLs?: Yes (appropriate for developmental age)  Prior Inpatient Therapy Prior Inpatient Therapy: Yes Prior Therapy Dates: 07/2019 Prior Therapy Facilty/Provider(s): [facility] Reason for Treatment: Paranoia   Prior Outpatient Therapy Prior Outpatient Therapy: No Does patient have an ACCT team?: No Does patient have Intensive In-House Services?  : No Does patient have [facility] services? : No Does patient have P4CC services?: No  ADL Screening (condition at time of admission) Patient's cognitive ability adequate to safely complete daily activities?: Yes Patient able to express need for assistance with ADLs?: Yes Independently performs  ADLs?: Yes (appropriate for developmental age)       Abuse/Neglect Assessment (Assessment to be complete while patient is alone) Abuse/Neglect Assessment Can Be Completed: Yes Physical Abuse: Denies Verbal Abuse: Denies Sexual Abuse: Denies Exploitation of patient/patient's resources: Denies Self-Neglect: Denies Values / Beliefs Cultural Requests During Hospitalization: None Spiritual Requests During Hospitalization: None   Advance Directives (For Healthcare) Does Patient Have a Medical Advance Directive?: No          Disposition:  Disposition Initial Assessment Completed for this Encounter: Yes Patient referred to: Other (Comment)(Consult with [facility] )  On Site Evaluation by:   Reviewed with Physician:    Asa Saunas 08/23/2019 4:15 AM

## 2019-08-23 NOTE — ED Provider Notes (Signed)
[facility] Emergency Department Provider Note  ____________________________________________  Time seen: Approximately 3:15 AM  I have reviewed the triage vital signs and the nursing notes.   HISTORY  Chief Complaint Psychiatric Evaluation    HPI [name] is a 33 y.o. female with a history of schizophrenia who is brought to the ED under involuntary commitment due to hearing voices today telling her to kill herself.  This caused her to feel suicidal.  Reports that since arriving to the emergency department the symptoms have resolved.  She now denies SI HI or hallucinations.  Feels back to normal.  She does note that she has been taking her schizophrenia medicine only intermittently because she was worried about side effects while taking care of her 69-year-old and 41-year-old children.  Denies any acute illness or other acute symptoms.      Past Medical History:  Diagnosis Date  . Abdominal pain affecting pregnancy, antepartum 02/28/2016  . Anemia    LAST HGB 04-03-17 12.5  . Anxiety   . Depression   . Gallstones 2018  . Incisional ventral hernia w obstruction   . Obesity 2013-10-08   Last Assessment & Plan:  Obesity in pregnancy is associated with maternal and obstetric complications including increased risk of gestational DM, gestational HTN, preeclampsia, thromboembolism, failed induction of labor, Cesarean section, excessive gestational weight gain and post-partum weight retention. Obesity is also associated with fetal complications including prematurity, stillbirth, congen  . Post-operative state 07/10/16  . SIDS (sudden infant death syndrome) 08-Oct-2013   Last Assessment & Plan:  Ms. Brager third son died at three months of age from [place]. I reviewed with Ms. Fei that the cause of SIDS is unknown. I did review with her that placing infants to sleep on their backs without soft bedding and tobacco cessation are potentially modifiable risk factors to  decrease her risk of SIDS.  Marland Kitchen Small bowel obstruction ([place])   . Supervision of high risk pregnancy in third trimester 10/08/13   Last Assessment & Plan:  Ms. Armenteros is receiving her care at [place] [facility]. She reports having an appointment tomorrow 12/3. I encouraged her to get a flu and TDAP vaccination. She is due for her CBC, RPR, third trimester HIV and glucola.   . Tobacco abuse 2013-10-08   Last Assessment & Plan:  I discussed with Ms. Rumberger that the risks of smoking in pregnancy include spontaneous pregnancy loss, placental abruption, preterm premature rupture of membranes (PPROM), placenta previa, preterm labor and delivery, and low birth weight (LBW) as well as SIDS. I discussed with her that her tobacco use is a modifiable risk factor for risk of placental abruption and SIDS, b     Patient Active Problem List   Diagnosis Date Noted  . Mania ([place]) 08/02/2019  . Psychosis, paranoid ([place]) 08/02/2019  . Calculus of bile duct without cholecystitis with obstruction   . Encounter for fitting and adjustment of other gastrointestinal appliance and device   . Jaundice   . Obstruction of bile duct   . Choledocholithiasis 06/05/2017  . Incisional hernia 05/07/2017  . Incisional ventral hernia w obstruction   . Small bowel obstruction ([place])   . Post-operative state 10-Jul-2016  . Abdominal pain affecting pregnancy, antepartum 02/28/2016  . IUFD (intrauterine fetal death) Oct 08, 2013  . Obesity 10/08/13  . SIDS (sudden infant death syndrome) 10-08-2013  . Supervision of high risk pregnancy in third trimester 2013-10-08  . Tobacco abuse Oct 08, 2013     Past Surgical History:  Procedure Laterality Date  . CESAREAN SECTION N/A 06/12/2016   Procedure: STAT CESAREAN SECTION WITH BILATERAL STERILIZATION;  Surgeon: Suzy Bouchard, MD;  Location: [facility] ORS;  Service: Obstetrics;  Laterality: N/A;  Baby Girl @ 2246 Weight 5 lb 10 oz Apgars 8/9  . ENDOSCOPIC RETROGRADE  CHOLANGIOPANCREATOGRAPHY (ERCP) WITH PROPOFOL N/A 06/06/2017   Procedure: ENDOSCOPIC RETROGRADE CHOLANGIOPANCREATOGRAPHY (ERCP) WITH PROPOFOL;  Surgeon: Midge Minium, MD;  Location: [facility] ENDOSCOPY;  Service: Endoscopy;  Laterality: N/A;  . ERCP N/A 10/30/2017   Procedure: ENDOSCOPIC RETROGRADE CHOLANGIOPANCREATOGRAPHY (ERCP) STENT REMOVAL;  Surgeon: Midge Minium, MD;  Location: [facility] ENDOSCOPY;  Service: Endoscopy;  Laterality: N/A;  . INCISIONAL HERNIA REPAIR N/A 05/07/2017   Procedure: LAPAROSCOPIC INCISIONAL HERNIA;  Surgeon: Henrene Dodge, MD;  Location: [facility] ORS;  Service: General;  Laterality: N/A;  . TUBAL LIGATION    . VENTRAL HERNIA REPAIR N/A 06/19/2016   Procedure: HERNIA REPAIR VENTRAL ADULT;  Surgeon: Lattie Haw, MD;  Location: [facility] ORS;  Service: General;  Laterality: N/A;     Prior to Admission medications   Not on File     Allergies Amoxicillin and Vicodin [hydrocodone-acetaminophen]   Family History  Problem Relation Age of Onset  . Diabetes Mother   . Asthma Sister   . Hypertension Maternal Aunt   . Hypertension Maternal Uncle     Social History Social History   Tobacco Use  . Smoking status: Current Every Day Smoker    Packs/day: 1.50    Years: 10.00    Pack years: 15.00    Types: Cigarettes  . Smokeless tobacco: Never Used  Substance Use Topics  . Alcohol use: Yes    Alcohol/week: 7.0 standard drinks    Types: 7 Cans of beer per week    Comment: 2 40oz/day  . Drug use: Yes    Comment: "pills"    Review of Systems  Constitutional:   No fever or chills.  ENT:   No sore throat. No rhinorrhea. Cardiovascular:   No chest pain or syncope. Respiratory:   No dyspnea or cough. Gastrointestinal:   Negative for abdominal pain, vomiting and diarrhea.  Musculoskeletal:   Negative for focal pain or swelling All other systems reviewed and are negative except as documented above in ROS and HPI.  ____________________________________________   PHYSICAL  EXAM:  VITAL SIGNS: ED Triage Vitals  Enc Vitals Group     BP 08/22/19 2344 (!) 147/94     Pulse Rate 08/22/19 2344 94     Resp 08/22/19 2344 14     Temp 08/22/19 2344 98.9 F (37.2 C)     Temp Source 08/22/19 2344 Oral     SpO2 08/22/19 2344 97 %     Weight --      Height --      Head Circumference --      Peak Flow --      Pain Score 08/22/19 2345 0     Pain Loc --      Pain Edu? --      Excl. in GC? --     Vital signs reviewed, nursing assessments reviewed.   Constitutional:   Alert and oriented. Non-toxic appearance. Eyes:   Conjunctivae are normal. EOMI.  ENT      Head:   Normocephalic and atraumatic.      Nose:   Wearing a mask.      Mouth/Throat:   Wearing a mask.      Neck:   No meningismus. Full ROM. Hematological/Lymphatic/Immunilogical:  No cervical lymphadenopathy. Cardiovascular:   RRR. Symmetric bilateral radial and DP pulses.  No murmurs. Cap refill less than 2 seconds. Respiratory:   Normal respiratory effort without tachypnea/retractions. Breath sounds are clear and equal bilaterally. No wheezes/rales/rhonchi. Musculoskeletal:   Normal range of motion in all extremities. No joint effusions.  No lower extremity tenderness.  No edema. Neurologic:   Normal speech and language.  Motor grossly intact. No acute focal neurologic deficits are appreciated.  Skin:    Skin is warm, dry and intact. No rash noted.  No petechiae, purpura, or bullae.  ____________________________________________    LABS (pertinent positives/negatives) (all labs ordered are listed, but only abnormal results are displayed) Labs Reviewed  COMPREHENSIVE METABOLIC PANEL - Abnormal; Notable for the following components:      Result Value   Potassium 3.3 (*)    CO2 20 (*)    Glucose, Bld 123 (*)    BUN <5 (*)    Total Protein 8.9 (*)    AST 58 (*)    All other components within normal limits  ETHANOL - Abnormal; Notable for the following components:   Alcohol, Ethyl (B) 187 (*)     All other components within normal limits  ACETAMINOPHEN LEVEL - Abnormal; Notable for the following components:   Acetaminophen (Tylenol), Serum <10 (*)    All other components within normal limits  CBC - Abnormal; Notable for the following components:   WBC 14.5 (*)    RBC 5.33 (*)    MCV 78.4 (*)    Platelets 503 (*)    All other components within normal limits  SALICYLATE LEVEL  URINE DRUG SCREEN, QUALITATIVE ([facility] ONLY)  POC URINE PREG, ED  POCT PREGNANCY, URINE   ____________________________________________   EKG    ____________________________________________    RADIOLOGY  No results found.  ____________________________________________   PROCEDURES Procedures  ____________________________________________    CLINICAL IMPRESSION / ASSESSMENT AND PLAN / ED COURSE  Medications ordered in the ED: Medications - No data to display  Pertinent labs & imaging results that were available during my care of the patient were reviewed by me and considered in my medical decision making (see chart for details).  Coralee Northshley R Neth was evaluated in Emergency Department on 08/23/2019 for the symptoms described in the history of present illness. She was evaluated in the context of the global COVID-19 pandemic, which necessitated consideration that the patient might be at risk for infection with the SARS-CoV-2 virus that causes COVID-19. Institutional protocols and algorithms that pertain to the evaluation of patients at risk for COVID-19 are in a state of rapid change based on information released by regulatory bodies including the CDC and federal and state organizations. These policies and algorithms were followed during the patient's care in the ED.   Patient is nontoxic well-appearing, unremarkable vital signs.  Currently asymptomatic.  Brought to the ED under involuntary commitment due to concerns about acute psychosis with auditory hallucinations.  Will await psychiatry  evaluation for further recommendations.  She is medically stable.      ____________________________________________   FINAL CLINICAL IMPRESSION(S) / ED DIAGNOSES    Final diagnoses:  Acute psychosis [facility](HCC)     ED Discharge Orders    None      Portions of this note were generated with dragon dictation software. Dictation errors may occur despite best attempts at proofreading.   Sharman CheekStafford, Liesl Simons, MD 08/23/19 [phone]

## 2019-08-23 NOTE — ED Notes (Signed)
Pt taken to consult room to talk with TTS.

## 2019-08-23 NOTE — ED Notes (Signed)
This RN spoke to sister who was going to try to get in contact with mother. Mother or sister has not called back and now sister is not answering phone.

## 2019-08-23 NOTE — Discharge Instructions (Signed)
Managing Schizoaffective Disorder °If you have been diagnosed with schizoaffective disorder (ScAD), you may be relieved to know why you have felt or behaved a certain way. You may also feel overwhelmed about the treatment ahead, how to get the support you need, and how to deal with the condition day-to-day. With care and support, you can learn to manage your symptoms and live with ScAD. °ScAD is a chronic, lifelong condition that may occur in cycles. Periods of severe symptoms may be followed by periods of less severe symptoms or improvement. There are steps you can take to help manage ScAD and make your life better. °How to manage lifestyle changes °Managing stress °Stress is your body's reaction to life changes and events, both good and bad. For people with ScAD, stress can cause more severe symptoms to start (can be a trigger), so it is important to learn ways to deal with stress. Your health care provider, therapist, or counselor may suggest techniques such as: °· Meditation, muscle relaxation, and breathing exercises. °· Music therapy. This can include creating music or listening to music. °· Life skills training. This training is focused on work, self-care, money, house management, and social skills. °Other things you can do to manage stress include: °· Keeping a stress diary. This can help you learn what causes your stress to start and how you can control your response to those triggers. °· Exercising. Even a short daily walk can help. °· Getting enough sleep. °· Making a schedule to manage your time. Knowing what you will do from day to day helps you avoid feeling overwhelmed by tasks and deadlines. °· Spending time on hobbies you enjoy that help you relax. ° °Medicines °Your health care provider is likely to prescribe various types of medicine depending on your symptoms. These may include one or more of the following types: °· Antipsychotics. °· Mood stabilizers. °· Antidepressants. °Make sure you: °· Talk  with your pharmacist or health care provider about all medicines that you take, the possible side effects, and which medicines are safe to take together. °· Make it your goal to take part in all treatment decisions (shared decision-making). Ask about possible side effects of medicines that your health care provider recommends, and tell him or her how you feel about having those side effects. It is best if shared decision-making with your health care provider is part of your total treatment plan. °Relationships °Having the support of your family and friends can play a major role in the success of your treatment. The following steps can help you maintain healthy relationships: °· Think about going to couples therapy, family therapy, or family education classes. °· Create a written plan for your treatment, and include close family members and friends in the process. °· Consider bringing your partner or another family member or friend to the appointments you have with your health care provider. °How to recognize changes in your condition °If you find that your condition is getting worse, talk to your health care provider right away. Watch for these signs: °· Your mood becomes extreme with either emotional highs or the intense lows of depression. °· Your speech becomes unclear. °· You are disorganized, show the wrong social behaviors, or withdraw from social activities. °· You have racing thoughts and have trouble thinking clearly or staying focused. °· You hear, see, taste, and believe things that others do not. °· You have poor personal hygiene, weight gain or weight loss, or changes in how you are sleeping or eating. °  Follow these instructions at home:  Take over-the-counter and prescription medicines only as told by your health care provider. Do not start new medicines or stop taking medicines before you ask your health care provider if it is safe to make those changes.  Avoid caffeine, alcohol, and drugs. They  can affect how your medicine works and can make your symptoms worse.  Eat a healthy diet.  Look for support groups in your area so you can meet other people with your condition. You can learn new methods of managing ScAD by listening to others.  Keep all follow-up visits as told by your health care provider, therapist, or counselor. This is important. Where to find support Talking to others  Reach out to trusted friends or family members, explain your condition, and let them know that you are working with a health care team.  Consider giving educational materials to friends and family.  If you are having trouble telling your friends and family about your condition, keep in mind that honest and open communication can make these conversations easier. Finances Be sure to check with your insurance carrier to find out what treatment options are covered by your plan. You may also be able to find financial assistance through not-for-profit organizations or with local government-based resources. If you are taking medicines, you may be able to get the generic form, which may be less expensive than brand-name medicine. Some makers of prescription medicines also offer help to patients who cannot afford the medicines that they need. Therapy and support groups  Make sure you find a counselor or therapist who is familiar with ScAD. Meet with your counselor or therapist once a week or more often if needed.  Find support programs for people with ScAD. Where to find more information  [employer] on Mental Illness: www.nami.org Contact a health care provider if:  You are not able to take your medicines as prescribed.  Your symptoms get worse. Get help right away if:  You have serious thoughts about hurting yourself or others. If you ever feel like you may hurt yourself or others, or have thoughts about taking your own life, get help right away. You can go to your nearest emergency department or  call:  Your local emergency services (911 in the [place]  A suicide crisis helpline, such as the [facility] at 980-606-5863. This is open 24 hours a day. Summary  Schizoaffective disorder (ScAD) is a chronic, lifelong illness. It is best controlled with continuous treatment that includes medicine and therapy.  Learning ways to manage stress may help your treatment to work better.  Having the support of your family and friends can be a key to making your treatment a success.  If you find that your condition is getting worse, talk to your health care provider right away. This information is not intended to replace advice given to you by your health care provider. Make sure you discuss any questions you have with your health care provider. Document Released: 01/25/2017 Document Revised: 01/17/2019 Document Reviewed: 01/25/2017 Elsevier Patient Education  [place].

## 2019-08-23 NOTE — ED Notes (Signed)
Pt denies SI, HI, or voices.

## 2019-08-23 NOTE — ED Notes (Signed)
Jamie, NP at bedside.  

## 2019-08-23 NOTE — ED Notes (Addendum)
Pt given coke and meal tray

## 2019-08-23 NOTE — ED Notes (Addendum)
Pt given hospital scrubs and belongings bag. Mask in place. Belongings include:  Nike slides, pink and white shirt, yellow plaid pj pants.  Pt dressed out w/ this tech and TEFL teacher, [employer].

## 2019-08-23 NOTE — ED Notes (Signed)
This RN spoke to mother. Mother asked if she is concerned about pt going home. Mother states "no, not really." Mother asked if she would be worried about any safety issues if she went home. Mother states "no, I think she'll be alright." Mother willing to pick up patient when d/c.

## 2019-08-28 ENCOUNTER — Other Ambulatory Visit: Payer: Self-pay

## 2019-08-28 ENCOUNTER — Emergency Department
Admission: EM | Admit: 2019-08-28 | Discharge: 2019-08-29 | Disposition: A | Discharge status: Other Acute Inpatient Hospital | Payer: Medicaid Other | Attending: Emergency Medicine | Admitting: Emergency Medicine

## 2019-08-28 DIAGNOSIS — R443 Hallucinations, unspecified: Secondary | ICD-10-CM

## 2019-08-28 DIAGNOSIS — Z885 Allergy status to narcotic agent status: Secondary | ICD-10-CM | POA: Insufficient documentation

## 2019-08-28 DIAGNOSIS — R441 Visual hallucinations: Secondary | ICD-10-CM | POA: Insufficient documentation

## 2019-08-28 DIAGNOSIS — F209 Schizophrenia, unspecified: Secondary | ICD-10-CM | POA: Insufficient documentation

## 2019-08-28 DIAGNOSIS — Z880 Allergy status to penicillin: Secondary | ICD-10-CM | POA: Diagnosis not present

## 2019-08-28 DIAGNOSIS — R45851 Suicidal ideations: Secondary | ICD-10-CM | POA: Insufficient documentation

## 2019-08-28 DIAGNOSIS — Z20828 Contact with and (suspected) exposure to other viral communicable diseases: Secondary | ICD-10-CM | POA: Diagnosis not present

## 2019-08-28 DIAGNOSIS — R440 Auditory hallucinations: Secondary | ICD-10-CM | POA: Diagnosis present

## 2019-08-28 DIAGNOSIS — Z79899 Other long term (current) drug therapy: Secondary | ICD-10-CM | POA: Diagnosis not present

## 2019-08-28 DIAGNOSIS — F17210 Nicotine dependence, cigarettes, uncomplicated: Secondary | ICD-10-CM | POA: Diagnosis not present

## 2019-08-28 LAB — COMPREHENSIVE METABOLIC PANEL
ALT: 34 U/L (ref 0–44)
AST: 44 U/L — ABNORMAL HIGH (ref 15–41)
Albumin: 4.2 g/dL (ref 3.5–5.0)
Alkaline Phosphatase: 76 U/L (ref 38–126)
Anion gap: 15 (ref 5–15)
BUN: <5 mg/dL — ABNORMAL LOW (ref 6–20)
CO2: 19 mmol/L — ABNORMAL LOW (ref 22–32)
Calcium: 9.5 mg/dL (ref 8.9–10.3)
Chloride: 98 mmol/L (ref 98–111)
Creatinine, Ser: 0.54 mg/dL (ref 0.44–1.00)
GFR calc Af Amer: >60 mL/min (ref >60)
GFR calc non Af Amer: >60 mL/min (ref >60)
Glucose, Bld: 123 mg/dL — ABNORMAL HIGH (ref 70–99)
Potassium: 3 mmol/L — ABNORMAL LOW (ref 3.5–5.1)
Sodium: 132 mmol/L — ABNORMAL LOW (ref 135–145)
Total Bilirubin: 1.2 mg/dL (ref 0.3–1.2)
Total Protein: 8.8 g/dL — ABNORMAL HIGH (ref 6.5–8.1)

## 2019-08-28 LAB — URINALYSIS, COMPLETE (UACMP) WITH MICROSCOPIC
Bilirubin Urine: NEGATIVE
Glucose, UA: NEGATIVE mg/dL
Ketones, ur: NEGATIVE mg/dL
Leukocytes,Ua: NEGATIVE
Nitrite: NEGATIVE
Protein, ur: NEGATIVE mg/dL
Specific Gravity, Urine: 1 — ABNORMAL LOW (ref 1.005–1.030)
pH: 6 (ref 5.0–8.0)

## 2019-08-28 LAB — CBC
HCT: 40.6 % (ref 36.0–46.0)
Hemoglobin: 13.9 g/dL (ref 12.0–15.0)
MCH: 27.6 pg (ref 26.0–34.0)
MCHC: 34.2 g/dL (ref 30.0–36.0)
MCV: 80.7 fL (ref 80.0–100.0)
Platelets: 366 10*3/uL (ref 150–400)
RBC: 5.03 MIL/uL (ref 3.87–5.11)
RDW: 14 % (ref 11.5–15.5)
WBC: 12.9 10*3/uL — ABNORMAL HIGH (ref 4.0–10.5)
nRBC: 0 % (ref 0.0–0.2)

## 2019-08-28 LAB — POCT PREGNANCY, URINE: Preg Test, Ur: NEGATIVE

## 2019-08-28 LAB — URINE DRUG SCREEN, QUALITATIVE (ARMC ONLY)
Amphetamines, Ur Screen: NOT DETECTED
Barbiturates, Ur Screen: NOT DETECTED
Benzodiazepine, Ur Scrn: NOT DETECTED
Cannabinoid 50 Ng, Ur ~~LOC~~: NOT DETECTED
Cocaine Metabolite,Ur ~~LOC~~: NOT DETECTED
MDMA (Ecstasy)Ur Screen: NOT DETECTED
Methadone Scn, Ur: NOT DETECTED
Opiate, Ur Screen: NOT DETECTED
Phencyclidine (PCP) Ur S: NOT DETECTED
Tricyclic, Ur Screen: NOT DETECTED

## 2019-08-28 LAB — ETHANOL: Alcohol, Ethyl (B): 50 mg/dL — ABNORMAL HIGH (ref <10)

## 2019-08-28 NOTE — Consult Note (Signed)
[facility]   Reason for Consult:  Psych consult Referring Physician: Dr. Marisa SeverinSiadecki  Patient Identification: [name] MRN:  [id number] Principal Diagnosis: Suicidal thoughts Diagnosis:  Principal Problem:   Suicidal thoughts Active Problems:   Hallucinations, unspecified   Total Time spent with patient: 45 minutes  Subjective:   [name] is a 33 y.o. female patient admitted with reports of making suicidal threats.   HPI: [facility]. Her sister stated that the pt texted her making comments about wanting to kill herself and others.    Upon approach, pt is lying in bed curled up in a ball. Pt is dressed in hospital scrubs, appearance is disheveled.  Her speech is low and and pt appears to be thought blocking. Patient was slow to respond when writer begins assessment questions.  When asked what brings you here tonight? Patient stares blankly. Writers asked the question again,  patient continued to stare blankly and then stated her son said to come over here.  Writer asked pt why did her son want her to come here? pt appeared to be responding to internal stimuli and continued to stare with her blank expression. At this time, pt's mood is depressed and affect is flat.  Pt does not respond to questions appropriately.  Patient appears to be thought blocking.  When asked if pt patient wanted to kill or harm herself. Pt was slow to respond and when she responded, pt simply shrugged her shoulders.  When asked do you want to harm anyone else, patient stated that she didn't know if wanted to hurt anyone else.  Pt was struggling to get her thoughts together.   Writer asks if she knows the difference between reality and whats in her thoughts. Patient responded "no".  Writer asks "Did you use any drugs legal or illegal" pt responds "I dont know, I don't think so, I don't normally use drugs, but I don't know".  Do you see a therapist? " No" do you see a  psychiatrist? "No" but I saw someone a while ago when I was here.  Writer responds, you were here  4 days ago, pt responds "yeah".  At this time writer is recommending inpatient psychiatric hospitalization because  patient is actively psychotic AEB pt's inability to discern reality from AVH, delusional thinking and having homicidal and suicidal tendencies during this assessment.   Past Psychiatric History: yes   Risk to Self:   yes  Risk to Others:  yes  Prior Inpatient Therapy:  yes  Prior Outpatient Therapy:   yes Past Medical History:  Past Medical History:  Diagnosis Date  . Abdominal pain affecting pregnancy, antepartum 02/28/2016  . Anemia    LAST HGB 04-03-17 12.5  . Anxiety   . Depression   . Gallstones 2018  . Incisional ventral hernia w obstruction   . Obesity 09/15/2013   Last Assessment & Plan:  Obesity in pregnancy is associated with maternal and obstetric complications including increased risk of gestational DM, gestational HTN, preeclampsia, thromboembolism, failed induction of labor, Cesarean section, excessive gestational weight gain and post-partum weight retention. Obesity is also associated with fetal complications including prematurity, stillbirth, congen  . Post-operative state 06/13/2016  . SIDS (sudden infant death syndrome) 09/08/2013   Last Assessment & Plan:  [name]'s third son died at three months of age from SIDS. I reviewed with [name] that the cause of SIDS is unknown. I did review with her that placing infants  to sleep on their backs without soft bedding and tobacco cessation are potentially modifiable risk factors to decrease her risk of SIDS.  Marland Kitchen Small bowel obstruction (HCC)   . Supervision of high risk pregnancy in third trimester Sep 29, 2013   Last Assessment & Plan:  Ms. Keng is receiving her [facility]. She reports having an appointment tomorrow 12/3. I encouraged her to get a flu and TDAP vaccination. She is due for her CBC, RPR,  third trimester HIV and glucola.   . Tobacco abuse 09/29/13   Last Assessment & Plan:  I discussed with Ms. Kuennen that the risks of smoking in pregnancy include spontaneous pregnancy loss, placental abruption, preterm premature rupture of membranes (PPROM), placenta previa, preterm labor and delivery, and low birth weight (LBW) as well as SIDS. I discussed with her that her tobacco use is a modifiable risk factor for risk of placental abruption and SIDS, b    Past Surgical History:  Procedure Laterality Date  . CESAREAN SECTION N/A 06/12/2016   Procedure: STAT CESAREAN SECTION WITH BILATERAL STERILIZATION;  Surgeon: Suzy Bouchard, MD;  Location: [facility] ORS;  Service: Obstetrics;  Laterality: N/A;  Baby Girl @ 2246 Weight 5 lb 10 oz Apgars 8/9  . ENDOSCOPIC RETROGRADE CHOLANGIOPANCREATOGRAPHY (ERCP) WITH PROPOFOL N/A 06/06/2017   Procedure: ENDOSCOPIC RETROGRADE CHOLANGIOPANCREATOGRAPHY (ERCP) WITH PROPOFOL;  Surgeon: Midge Minium, MD;  Location: [facility] ENDOSCOPY;  Service: Endoscopy;  Laterality: N/A;  . ERCP N/A 10/30/2017   Procedure: ENDOSCOPIC RETROGRADE CHOLANGIOPANCREATOGRAPHY (ERCP) STENT REMOVAL;  Surgeon: Midge Minium, MD;  Location: [facility] ENDOSCOPY;  Service: Endoscopy;  Laterality: N/A;  . INCISIONAL HERNIA REPAIR N/A 05/07/2017   Procedure: LAPAROSCOPIC INCISIONAL HERNIA;  Surgeon: Henrene Dodge, MD;  Location: [facility] ORS;  Service: General;  Laterality: N/A;  . TUBAL LIGATION    . VENTRAL HERNIA REPAIR N/A 06/19/2016   Procedure: HERNIA REPAIR VENTRAL ADULT;  Surgeon: Lattie Haw, MD;  Location: [facility] ORS;  Service: General;  Laterality: N/A;   Family History:  Family History  Problem Relation Age of Onset  . Diabetes Mother   . Asthma Sister   . Hypertension Maternal Aunt   . Hypertension Maternal Uncle    Family Psychiatric  History: Unknown Social History:  Social History   Substance and Sexual Activity  Alcohol Use Yes  . Alcohol/week: 7.0 standard drinks  .  Types: 7 Cans of beer per week   Comment: 2 40oz/day     Social History   Substance and Sexual Activity  Drug Use Yes   Comment: "pills"    Social History   Socioeconomic History  . Marital status: Single    Spouse name: Not on file  . Number of children: Not on file  . Years of education: Not on file  . Highest education level: Not on file  Occupational History  . Not on file  Social Needs  . Financial resource strain: Not on file  . Food insecurity    Worry: Not on file    Inability: Not on file  . Transportation needs    Medical: Not on file    Non-medical: Not on file  Tobacco Use  . Smoking status: Current Every Day Smoker    Packs/day: 1.50    Years: 10.00    Pack years: 15.00    Types: Cigarettes  . Smokeless tobacco: Never Used  Substance and Sexual Activity  . Alcohol use: Yes    Alcohol/week: 7.0 standard drinks    Types: 7 Cans  of beer per week    Comment: 2 40oz/day  . Drug use: Yes    Comment: "pills"  . Sexual activity: Never  Lifestyle  . Physical activity    Days per week: Not on file    Minutes per session: Not on file  . Stress: Not on file  Relationships  . Social Musician on phone: Not on file    Gets together: Not on file    Attends religious service: Not on file    Active member of club or organization: Not on file    Attends meetings of clubs or organizations: Not on file    Relationship status: Not on file  Other Topics Concern  . Not on file  Social History Narrative  . Not on file   Additional Social History:    Allergies:   Allergies  Allergen Reactions  . Amoxicillin   . Vicodin [Hydrocodone-Acetaminophen]     Labs:  Results for orders placed or performed during the hospital encounter of 08/28/19 (from the past 48 hour(s))  CBC     Status: Abnormal   Collection Time: 08/28/19  7:31 PM  Result Value Ref Range   WBC 12.9 (H) 4.0 - 10.5 K/uL   RBC 5.03 3.87 - 5.11 MIL/uL   Hemoglobin 13.9 12.0 - 15.0  g/dL   HCT 16.1 09.6 - 04.5 %   MCV 80.7 80.0 - 100.0 fL   MCH 27.6 26.0 - 34.0 pg   MCHC 34.2 30.0 - 36.0 g/dL   RDW 40.9 81.1 - 91.4 %   Platelets 366 150 - 400 K/uL   nRBC 0.0 0.0 - 0.2 %    Comment: Performed at [facility], [address] [place], [place] [address]  Comprehensive metabolic panel     Status: Abnormal   Collection Time: 08/28/19  7:31 PM  Result Value Ref Range   Sodium 132 (L) 135 - 145 mmol/L   Potassium 3.0 (L) 3.5 - 5.1 mmol/L   Chloride 98 98 - 111 mmol/L   CO2 19 (L) 22 - 32 mmol/L   Glucose, Bld 123 (H) 70 - 99 mg/dL   BUN <5 (L) 6 - 20 mg/dL   Creatinine, Ser 6.21 0.44 - 1.00 mg/dL   Calcium 9.5 8.9 - 30.8 mg/dL   Total Protein 8.8 (H) 6.5 - 8.1 g/dL   Albumin 4.2 3.5 - 5.0 g/dL   AST 44 (H) 15 - 41 U/L   ALT 34 0 - 44 U/L   Alkaline Phosphatase 76 38 - 126 U/L   Total Bilirubin 1.2 0.3 - 1.2 mg/dL   GFR calc non Af Amer >60 >60 mL/min   GFR calc Af Amer >60 >60 mL/min   Anion gap 15 5 - 15    Comment: Performed at [facility], [address] [place], [place] [address]  Ethanol     Status: Abnormal   Collection Time: 08/28/19  7:31 PM  Result Value Ref Range   Alcohol, Ethyl (B) 50 (H) <10 mg/dL    Comment: (NOTE) Lowest detectable limit for serum alcohol is 10 mg/dL. For medical purposes only. Performed at [facility], [address] [place], [place] [address]   Urinalysis, Complete w Microscopic     Status: Abnormal   Collection Time: 08/28/19  7:31 PM  Result Value Ref Range   Color, Urine STRAW (A) YELLOW   APPearance CLEAR (A) CLEAR   Specific Gravity, Urine 1.000 (L) 1.005 - 1.030  pH 6.0 5.0 - 8.0   Glucose, UA NEGATIVE NEGATIVE mg/dL   Hgb urine dipstick SMALL (A) NEGATIVE   Bilirubin Urine NEGATIVE NEGATIVE   Ketones, ur NEGATIVE NEGATIVE mg/dL   Protein, ur NEGATIVE NEGATIVE mg/dL   Nitrite NEGATIVE NEGATIVE   Leukocytes,Ua NEGATIVE NEGATIVE   RBC / HPF 0-5 0 - 5 RBC/hpf   WBC, UA 0-5 0 -  5 WBC/hpf   Bacteria, UA RARE (A) NONE SEEN   Squamous Epithelial / LPF 0-5 0 - 5    Comment: Performed at [facility], [address], [place], [place] [address]  Urine Drug Screen, Qualitative ([facility] only)     Status: None   Collection Time: 08/28/19  7:31 PM  Result Value Ref Range   Tricyclic, Ur Screen NONE DETECTED NONE DETECTED   Amphetamines, Ur Screen NONE DETECTED NONE DETECTED   MDMA (Ecstasy)Ur Screen NONE DETECTED NONE DETECTED   Cocaine Metabolite,Ur Sun NONE DETECTED NONE DETECTED   Opiate, Ur Screen NONE DETECTED NONE DETECTED   Phencyclidine (PCP) Ur S NONE DETECTED NONE DETECTED   Cannabinoid 50 Ng, Ur  NONE DETECTED NONE DETECTED   Barbiturates, Ur Screen NONE DETECTED NONE DETECTED   Benzodiazepine, Ur Scrn NONE DETECTED NONE DETECTED   Methadone Scn, Ur NONE DETECTED NONE DETECTED    Comment: (NOTE) Tricyclics + metabolites, urine    Cutoff 1000 ng/mL Amphetamines + metabolites, urine  Cutoff 1000 ng/mL MDMA (Ecstasy), urine              Cutoff 500 ng/mL Cocaine Metabolite, urine          Cutoff 300 ng/mL Opiate + metabolites, urine        Cutoff 300 ng/mL Phencyclidine (PCP), urine         Cutoff 25 ng/mL Cannabinoid, urine                 Cutoff 50 ng/mL Barbiturates + metabolites, urine  Cutoff 200 ng/mL Benzodiazepine, urine              Cutoff 200 ng/mL Methadone, urine                   Cutoff 300 ng/mL The urine drug screen provides only a preliminary, unconfirmed analytical test result and should not be used for non-medical purposes. Clinical consideration and professional judgment should be applied to any positive drug screen result due to possible interfering substances. A more specific alternate chemical method must be used in order to obtain a confirmed analytical result. Gas chromatography / mass spectrometry (GC/MS) is the preferred confirmat ory method. Performed at [facility], [address] [place], [place]  [address]   Pregnancy, urine POC     Status: None   Collection Time: 08/28/19  7:50 PM  Result Value Ref Range   Preg Test, Ur NEGATIVE NEGATIVE    Comment:        THE SENSITIVITY OF THIS METHODOLOGY IS >24 mIU/mL     No current facility-administered medications for this encounter.    Current Outpatient Medications  Medication Sig Dispense Refill  . risperiDONE (RISPERDAL) 1 MG tablet Take 1 mg by mouth 2 (two) times daily.    . traZODone (DESYREL) 150 MG tablet Take 150 mg by mouth at bedtime.      Musculoskeletal: Strength & Muscle Tone: within normal limits Gait & Station: unable to assess due to patient lying in bed and not really moving. Patient leans: N/A  Psychiatric Specialty Exam: Physical  Exam  Nursing note and vitals reviewed. Constitutional: She appears well-developed.  HENT:  Head: Normocephalic.  Eyes: Pupils are equal, round, and reactive to light.  Neck: Normal range of motion.  Cardiovascular: Normal rate.  Respiratory: Effort normal.  Musculoskeletal: Normal range of motion.  Neurological: She is alert.  Skin: Skin is warm and dry.  Psychiatric: Her affect is inappropriate. She is slowed, withdrawn and actively hallucinating. Thought content is paranoid and delusional. Cognition and memory are impaired. She expresses inappropriate judgment. She expresses suicidal ideation. She is noncommunicative. She is inattentive.    Review of Systems  Psychiatric/Behavioral: Positive for hallucinations, memory loss and suicidal ideas.  All other systems reviewed and are negative.   Blood pressure 138/86, pulse (!) 107, temperature 99 F (37.2 C), temperature source Oral, resp. rate 20, height 5\' 4"  (1.626 m), weight 122.5 kg, last menstrual period 08/21/2019, SpO2 99 %.Body mass index is 46.35 kg/m.  General Appearance: Disheveled  Eye Contact:  Fair  Speech:  Blocked and Slow  Volume:  Increased  Mood:  Depressed and Dysphoric  Affect:  Blunt and Flat  Thought  Process:  Disorganized   Orientation:  Other:  AOx2. date and circumstance unknown.  Thought Content:  Illogical, Delusions and Hallucinations: Auditory Visual  Suicidal Thoughts:  Yes.  without intent/plan  Homicidal Thoughts:  Yes.  without intent/plan  Memory:  Immediate;   Poor  Judgement:  Poor  Insight:  Lacking  Psychomotor Activity:  NA  Concentration:  Concentration: Poor  Recall:  Poor  Fund of Knowledge:  Poor  Language:  Fair  Akathisia:  NA  Handed:  Right  AIMS (if indicated):     Assets:  Social Support  ADL's:  Impaired  Cognition:  WNL and Impaired,  Moderate  Sleep:        Treatment Plan Summary: Medication management and Plan admit patient to inpatient psychiatric unit.    Disposition: Recommend psychiatric Inpatient admission when medically cleared. Supportive therapy provided about ongoing stressors.  Deloria Lair, NP 08/28/2019 9:20 PM

## 2019-08-28 NOTE — ED Notes (Signed)
Patient informed this RN that it sounded like there was someone crawling in her ceiling. RN informed patient that there was no one there. RN further informed patient that she may be hearing the TV from the patient in the next room.

## 2019-08-28 NOTE — ED Provider Notes (Signed)
[facility] Emergency Department Provider Note ____________________________________________   First MD Initiated Contact with Patient 08/28/19 1937     (approximate)  I have reviewed the triage vital signs and the nursing notes.   HISTORY  Chief Complaint Psychiatric Evaluation  Level 5 caveat: History of present illness limited due to uncooperative behavior  HPI [name] is a 33 y.o. female with PMH as noted below including history of schizophrenia who presents under involuntary commitment after apparently expressing SI.  Per the IVC paperwork, the patient texted family member stated she wanted to kill herself.  The patient denies any acute complaints.  She states that she "was disoriented, and now I am not."  When asked specifically about the text messages, she states that was last week.  She shakes her head no when I ask about suicidal ideation currently, but she seems unwilling to give any other history.  Past Medical History:  Diagnosis Date  . Abdominal pain affecting pregnancy, antepartum 02/28/2016  . Anemia    LAST HGB 04-03-17 12.5  . Anxiety   . Depression   . Gallstones 2018  . Incisional ventral hernia w obstruction   . Obesity 09/16/2013   Last Assessment & Plan:  Obesity in pregnancy is associated with maternal and obstetric complications including increased risk of gestational DM, gestational HTN, preeclampsia, thromboembolism, failed induction of labor, Cesarean section, excessive gestational weight gain and post-partum weight retention. Obesity is also associated with fetal complications including prematurity, stillbirth, congen  . Post-operative state 06/13/2016  . SIDS (sudden infant death syndrome) 09/18/2013   Last Assessment & Plan:  [name]'s third son died at three months of age from SIDS. I reviewed with [name] that the cause of SIDS is unknown. I did review with her that placing infants to sleep on their backs without  soft bedding and tobacco cessation are potentially modifiable risk factors to decrease her risk of SIDS.  Marland Kitchen. Small bowel obstruction (HCC)   . Supervision of high risk pregnancy in third trimester 09/29/2013   Last Assessment & Plan:  [name] [facility] She reports having an appointment tomorrow 12/3. I encouraged her to get a flu and TDAP vaccination. She is due for her CBC, RPR, third trimester HIV and glucola.   . Tobacco abuse 09/16/2013   Last Assessment & Plan:  I discussed with [name] that the risks of smoking in pregnancy include spontaneous pregnancy loss, placental abruption, preterm premature rupture of membranes (PPROM), placenta previa, preterm labor and delivery, and low birth weight (LBW) as well as SIDS. I discussed with her that her tobacco use is a modifiable risk factor for risk of placental abruption and SIDS, b    Patient Active Problem List   Diagnosis Date Noted  . Hallucinations, unspecified 08/28/2019  . Suicidal thoughts 08/28/2019  . Schizoaffective disorder, bipolar type (HCC) 08/23/2019  . Psychosis, paranoid (HCC) 08/02/2019  . Calculus of bile duct without cholecystitis with obstruction   . Encounter for fitting and adjustment of other gastrointestinal appliance and device   . Jaundice   . Obstruction of bile duct   . Choledocholithiasis 06/05/2017  . Incisional hernia 05/07/2017  . Incisional ventral hernia w obstruction   . Small bowel obstruction (HCC)   . Post-operative state 06/13/2016  . Abdominal pain affecting pregnancy, antepartum 02/28/2016  . IUFD (intrauterine fetal death) 09/30/2013  . Obesity 09/30/2013  . SIDS (sudden infant death syndrome) 09/30/2013  .  Supervision of high risk pregnancy in third trimester 10/10/13  . Tobacco abuse 10-10-2013    Past Surgical History:  Procedure Laterality Date  . CESAREAN SECTION N/A 06/12/2016   Procedure: STAT CESAREAN SECTION WITH BILATERAL STERILIZATION;   Surgeon: Boykin Nearing, MD;  Location: [facility] ORS;  Service: Obstetrics;  Laterality: N/A;  Baby Girl @ 2246 Weight 5 lb 10 oz Apgars 8/9  . ENDOSCOPIC RETROGRADE CHOLANGIOPANCREATOGRAPHY (ERCP) WITH PROPOFOL N/A 06/06/2017   Procedure: ENDOSCOPIC RETROGRADE CHOLANGIOPANCREATOGRAPHY (ERCP) WITH PROPOFOL;  Surgeon: Lucilla Lame, MD;  Location: [facility] ENDOSCOPY;  Service: Endoscopy;  Laterality: N/A;  . ERCP N/A 10/30/2017   Procedure: ENDOSCOPIC RETROGRADE CHOLANGIOPANCREATOGRAPHY (ERCP) STENT REMOVAL;  Surgeon: Lucilla Lame, MD;  Location: [facility] ENDOSCOPY;  Service: Endoscopy;  Laterality: N/A;  . INCISIONAL HERNIA REPAIR N/A 05/07/2017   Procedure: LAPAROSCOPIC INCISIONAL HERNIA;  Surgeon: Olean Ree, MD;  Location: [facility] ORS;  Service: General;  Laterality: N/A;  . TUBAL LIGATION    . VENTRAL HERNIA REPAIR N/A 06/19/2016   Procedure: HERNIA REPAIR VENTRAL ADULT;  Surgeon: Florene Glen, MD;  Location: [facility] ORS;  Service: General;  Laterality: N/A;    Prior to Admission medications   Medication Sig Start Date End Date Taking? Authorizing Provider  risperiDONE (RISPERDAL) 1 MG tablet Take 1 mg by mouth 2 (two) times daily. 08/07/19   [provider]  traZODone (DESYREL) 150 MG tablet Take 150 mg by mouth at bedtime. 08/07/19   [provider]    Allergies Amoxicillin and Vicodin [hydrocodone-acetaminophen]  Family History  Problem Relation Age of Onset  . Diabetes Mother   . Asthma Sister   . Hypertension Maternal Aunt   . Hypertension Maternal Uncle     Social History Social History   Tobacco Use  . Smoking status: Current Every Day Smoker    Packs/day: 1.50    Years: 10.00    Pack years: 15.00    Types: Cigarettes  . Smokeless tobacco: Never Used  Substance Use Topics  . Alcohol use: Yes    Alcohol/week: 7.0 standard drinks    Types: 7 Cans of beer per week    Comment: 2 40oz/day  . Drug use: Yes    Comment: "pills"    Review of Systems Level  5 caveat: Unable to obtain review of systems due to uncooperative behavior    ____________________________________________   PHYSICAL EXAM:  VITAL SIGNS: ED Triage Vitals [08/28/19 1926]  Enc Vitals Group     BP 138/86     Pulse Rate (!) 107     Resp 20     Temp 99 F (37.2 C)     Temp Source Oral     SpO2 99 %     Weight 270 lb (122.5 kg)     Height 5\' 4"  (1.626 m)     Head Circumference      Peak Flow      Pain Score      Pain Loc      Pain Edu?      Excl. in [place]?     Constitutional: Alert and oriented.  Relatively well appearing and in no acute distress. Eyes: Conjunctivae are normal.  Head: Atraumatic. Nose: No congestion/rhinnorhea. Mouth/Throat: Mucous membranes are moist.   Neck: Normal range of motion.  Cardiovascular: Good peripheral circulation. Respiratory: Normal respiratory effort.  No retractions. Gastrointestinal: No distention.  Musculoskeletal: Extremities warm and well perfused.  Neurologic:  Normal speech and language. No gross focal neurologic deficits are appreciated.  Skin:  Skin is warm and dry. No rash noted. Psychiatric: Flat affect.  ____________________________________________   LABS (all labs ordered are listed, but only abnormal results are displayed)  Labs Reviewed  CBC - Abnormal; Notable for the following components:      Result Value   WBC 12.9 (*)    All other components within normal limits  COMPREHENSIVE METABOLIC PANEL - Abnormal; Notable for the following components:   Sodium 132 (*)    Potassium 3.0 (*)    CO2 19 (*)    Glucose, Bld 123 (*)    BUN <5 (*)    Total Protein 8.8 (*)    AST 44 (*)    All other components within normal limits  ETHANOL - Abnormal; Notable for the following components:   Alcohol, Ethyl (B) 50 (*)    All other components within normal limits  URINALYSIS, COMPLETE (UACMP) WITH MICROSCOPIC - Abnormal; Notable for the following components:   Color, Urine STRAW (*)    APPearance CLEAR (*)     Specific Gravity, Urine 1.000 (*)    Hgb urine dipstick SMALL (*)    Bacteria, UA RARE (*)    All other components within normal limits  SARS CORONAVIRUS 2 (TAT 6-24 HRS)  URINE DRUG SCREEN, QUALITATIVE ([facility] ONLY)  POC URINE PREG, ED  POCT PREGNANCY, URINE   ____________________________________________  EKG   ____________________________________________  RADIOLOGY    ____________________________________________   PROCEDURES  Procedure(s) performed: No  Procedures  Critical Care performed: No ____________________________________________   INITIAL IMPRESSION / ASSESSMENT AND PLAN / ED COURSE  Pertinent labs & imaging results that were available during my care of the patient were reviewed by me and considered in my medical decision making (see chart for details).  33 year old female with a history of schizophrenia and other PMH as noted above presents for evaluation of SI.  She is under involuntary commitment.  The patient admits to sending a text message saying she wanted to kill herself, but states that it was last week.  She states she "was disoriented" and is not any longer, but does not give any other history.  I reviewed the past medical records in Epic.  The patient was just seen in the ED on 08/23/2019 after hearing voices telling her to kill herself.  She was cleared by psychiatry at that time.  She was seen last month with paranoid delusions about someone trying to kill her.  On exam, patient is relatively comfortable appearing.  Her vital signs are normal except for borderline tachycardia when she arrived.  She has a flat affect and is mostly uncooperative with history.  We will obtain lab work-up for medical clearance, psychiatry consultation, and reassess.  ----------------------------------------- 10:17 PM on 08/28/2019 -----------------------------------------  Lab work-up is unremarkable and consistent with the patient's baseline.  She is pending  psychiatry evaluation.  ____________________________________________   FINAL CLINICAL IMPRESSION(S) / ED DIAGNOSES  Final diagnoses:  Suicidal ideation      NEW MEDICATIONS STARTED DURING THIS VISIT:  New Prescriptions   No medications on file     Note:  This document was prepared using Dragon voice recognition software and may include unintentional dictation errors.    Dionne Bucy, MD 08/28/19 2217

## 2019-08-28 NOTE — ED Notes (Signed)
Pt given hospital scrubs and belongings bag with this tech and Officer [place]. Mask in place.  Belongings include:  Black shirt, black pants, underwear, Nike slides, bra.

## 2019-08-28 NOTE — ED Notes (Signed)
Hourly rounding reveals patient in room. No complaints, stable, in no acute distress. Q15 minute rounds and monitoring via Rover and Officer to continue.   

## 2019-08-28 NOTE — ED Triage Notes (Addendum)
Pt here under IVC, texted sister comments about wanting to hurt herself and others. Pt denies any SI or HI at this time. Pt states she was tested for Covid a few weeks ago several times and had a negative and a positive result.

## 2019-08-28 NOTE — ED Notes (Signed)
Patient asked this RN if the jacket under the wheelchair was her daughter's. There was no jacket under the wheelchair. This RN informed patient that her daughter's clothing was not anywhere in this ED.

## 2019-08-28 NOTE — ED Notes (Signed)
Pt. Transferred from Triage to room 21 after dressing out and screening for contraband. Report to include Situation, Background, Assessment and Recommendations from [place]. Pt. Oriented to Quad including Q15 minute rounds as well as Engineer, drilling for their protection. Patient is alert and oriented, warm and dry in no acute distress. Patient denies SI, HI, and AVH. Pt. Encouraged to let me know if needs arise.

## 2019-08-29 ENCOUNTER — Other Ambulatory Visit: Payer: Self-pay

## 2019-08-29 ENCOUNTER — Encounter: Payer: Self-pay | Admitting: Psychiatry

## 2019-08-29 ENCOUNTER — Inpatient Hospital Stay
Admission: RE | Admit: 2019-08-29 | Discharge: 2019-09-04 | DRG: 885 | Disposition: A | Discharge status: Home / Self Care | Payer: Medicaid Other | Source: Intra-hospital | Attending: Psychiatry | Admitting: Psychiatry

## 2019-08-29 DIAGNOSIS — F17210 Nicotine dependence, cigarettes, uncomplicated: Secondary | ICD-10-CM | POA: Diagnosis present

## 2019-08-29 DIAGNOSIS — R45851 Suicidal ideations: Secondary | ICD-10-CM | POA: Diagnosis present

## 2019-08-29 DIAGNOSIS — F203 Undifferentiated schizophrenia: Secondary | ICD-10-CM | POA: Diagnosis not present

## 2019-08-29 DIAGNOSIS — Y902 Blood alcohol level of 40-59 mg/100 ml: Secondary | ICD-10-CM | POA: Diagnosis present

## 2019-08-29 DIAGNOSIS — F1010 Alcohol abuse, uncomplicated: Secondary | ICD-10-CM | POA: Diagnosis present

## 2019-08-29 DIAGNOSIS — Z20828 Contact with and (suspected) exposure to other viral communicable diseases: Secondary | ICD-10-CM | POA: Diagnosis present

## 2019-08-29 DIAGNOSIS — F209 Schizophrenia, unspecified: Principal | ICD-10-CM | POA: Diagnosis present

## 2019-08-29 LAB — SARS CORONAVIRUS 2 (TAT 6-24 HRS): SARS Coronavirus 2: NEGATIVE

## 2019-08-29 MED ORDER — ALUM & MAG HYDROXIDE-SIMETH 200-200-20 MG/5ML PO SUSP: 30.0000 mL | ORAL | Status: DC | PRN

## 2019-08-29 MED ORDER — ADULT MULTIVITAMIN W/MINERALS CH
1.0000 | ORAL_TABLET | Freq: Every day | ORAL | Status: DC
  Administered 2019-08-29 – 2019-09-03 (×6): 1 via ORAL
  Filled 2019-08-29 (×7): qty 1

## 2019-08-29 MED ORDER — HYDROXYZINE HCL 25 MG PO TABS
25.0000 mg | ORAL_TABLET | Freq: Four times a day (QID) | ORAL | Status: AC | PRN
  Administered 2019-08-30 – 2019-09-01 (×2): 25 mg via ORAL
  Filled 2019-08-29 (×2): qty 1

## 2019-08-29 MED ORDER — LOPERAMIDE HCL 2 MG PO CAPS: 2.0000 mg | ORAL_CAPSULE | ORAL | Status: AC | PRN

## 2019-08-29 MED ORDER — RISPERIDONE 1 MG PO TABS
1.0000 mg | ORAL_TABLET | Freq: Two times a day (BID) | ORAL | Status: DC
  Administered 2019-08-29: 09:00:00 1 mg via ORAL
  Filled 2019-08-29 (×2): qty 1

## 2019-08-29 MED ORDER — RISPERIDONE 1 MG PO TABS
1.0000 mg | ORAL_TABLET | Freq: Two times a day (BID) | ORAL | Status: DC
  Administered 2019-08-29: 18:00:00 1 mg via ORAL
  Filled 2019-08-29: qty 1

## 2019-08-29 MED ORDER — NICOTINE 21 MG/24HR TD PT24
21.0000 mg | MEDICATED_PATCH | Freq: Every day | TRANSDERMAL | Status: DC
  Administered 2019-08-30 – 2019-09-03 (×4): 21 mg via TRANSDERMAL
  Filled 2019-08-29 (×6): qty 1

## 2019-08-29 MED ORDER — ONDANSETRON 4 MG PO TBDP
4.0000 mg | ORAL_TABLET | Freq: Four times a day (QID) | ORAL | Status: AC | PRN
  Administered 2019-08-30: 22:00:00 4 mg via ORAL
  Filled 2019-08-29: qty 1

## 2019-08-29 MED ORDER — LORAZEPAM 1 MG PO TABS
1.0000 mg | ORAL_TABLET | Freq: Four times a day (QID) | ORAL | Status: AC | PRN
  Administered 2019-08-29 – 2019-08-31 (×3): 1 mg via ORAL
  Filled 2019-08-29 (×3): qty 1

## 2019-08-29 MED ORDER — THIAMINE HCL 100 MG/ML IJ SOLN
100.0000 mg | Freq: Once | INTRAMUSCULAR | Status: AC
  Administered 2019-08-29: 100 mg via INTRAMUSCULAR
  Filled 2019-08-29: qty 2

## 2019-08-29 MED ORDER — VITAMIN B-1 100 MG PO TABS
100.0000 mg | ORAL_TABLET | Freq: Every day | ORAL | Status: DC
  Administered 2019-08-29 – 2019-09-03 (×6): 100 mg via ORAL
  Filled 2019-08-29 (×7): qty 1

## 2019-08-29 MED ORDER — MAGNESIUM HYDROXIDE 400 MG/5ML PO SUSP
30.0000 mL | Freq: Every day | ORAL | Status: DC | PRN
  Administered 2019-09-03: 08:00:00 30 mL via ORAL
  Filled 2019-08-29: qty 30

## 2019-08-29 MED ORDER — TRAZODONE HCL 50 MG PO TABS: 150.0000 mg | ORAL_TABLET | Freq: Every day | ORAL | Status: DC

## 2019-08-29 NOTE — BH Assessment (Signed)
Clinician attempted to meet with pt to complete [facility] Assessment but pt is responding to internal stimuli and is unable to answer questions at this time.

## 2019-08-29 NOTE — ED Notes (Signed)
Pt told this writer she is bleeding from her rectum.  No blood noted on bed or patient's pants.  Pt told to not flush after she uses the bathroom for RN to see any blood present. EDP made aware.

## 2019-08-29 NOTE — ED Provider Notes (Signed)
-----------------------------------------   5:48 AM on 08/29/2019 -----------------------------------------   Blood pressure 138/86, pulse (!) 107, temperature 99 F (37.2 C), temperature source Oral, resp. rate 20, height 5\' 4"  (1.626 m), weight 122.5 kg, last menstrual period 08/21/2019, SpO2 99 %.  The patient is sleeping at this time.  There have been no acute events since the last update.  Awaiting disposition plan from Behavioral Medicine and/or Social Work team(s).   Paulette Blanch, MD 08/29/19 [phone]

## 2019-08-29 NOTE — BHH Suicide Risk Assessment (Signed)
[facility] Admission Suicide Risk Assessment   Nursing information obtained from:    Demographic factors:    Current Mental Status:    Loss Factors:    Historical Factors:    Risk Reduction Factors:     Total Time spent with patient: 1 hour Principal Problem: Schizophrenia ([place]) Diagnosis:  Principal Problem:   Schizophrenia ([place]) Active Problems:   Alcohol abuse  Subjective Data: Patient seen chart reviewed.  33 year old woman brought to the hospital under involuntary commitment that alleges she had texted suicidal statements.  Patient is  Continued Clinical Symptoms:    The "Alcohol Use Disorders Identification Test", Guidelines for Use in Primary Care, Second Edition.  World Pharmacologist [facility]). Score between 0-7:  no or low risk or alcohol related problems. Score between 8-15:  moderate risk of alcohol related problems. Score between 16-19:  high risk of alcohol related problems. Score 20 or above:  warrants further diagnostic evaluation for alcohol dependence and treatment.   CLINICAL FACTORS:   Alcohol/Substance Abuse/Dependencies Schizophrenia:   Less than 100 years old   Musculoskeletal: Strength & Muscle Tone: within normal limits Gait & Station: normal Patient leans: N/A  Psychiatric Specialty Exam: Physical Exam  Nursing note and vitals reviewed. Constitutional: She appears well-developed and well-nourished.  HENT:  Head: Normocephalic and atraumatic.  Eyes: Pupils are equal, round, and reactive to light. Conjunctivae are normal.  Neck: Normal range of motion.  Cardiovascular: Regular rhythm and normal heart sounds.  Respiratory: Effort normal. No respiratory distress.  GI: Soft.  Musculoskeletal: Normal range of motion.  Neurological: She is alert.  Skin: Skin is warm and dry.  Psychiatric: Her affect is blunt. Her speech is delayed and slurred. She is slowed. She is not agitated and not aggressive. Thought content is paranoid. Cognition and memory are  impaired. She expresses inappropriate judgment. She expresses no homicidal and no suicidal ideation.    Review of Systems  Constitutional: Negative.   HENT: Negative.   Eyes: Negative.   Respiratory: Negative.   Cardiovascular: Negative.   Gastrointestinal: Negative.   Musculoskeletal: Negative.   Skin: Negative.   Neurological: Negative.   Psychiatric/Behavioral: Positive for hallucinations, memory loss and substance abuse. Negative for depression and suicidal ideas. The patient is nervous/anxious and has insomnia.     Blood pressure (!) 110/96, pulse (!) 102, temperature 98.2 F (36.8 C), temperature source Oral, resp. rate 18, height 5\' 4"  (1.626 m), weight 122 kg, last menstrual period 08/21/2019, SpO2 99 %.Body mass index is 46.17 kg/m.  General Appearance: Disheveled  Eye Contact:  Minimal  Speech:  Slow  Volume:  Decreased  Mood:  Depressed and Dysphoric  Affect:  Constricted  Thought Process:  NA  Orientation:  Full (Time, Place, and Person)  Thought Content:  Illogical and Rumination  Suicidal Thoughts:  No  Homicidal Thoughts:  No  Memory:  Immediate;   Fair Recent;   Poor Remote;   Poor  Judgement:  Impaired  Insight:  Shallow  Psychomotor Activity:  Decreased  Concentration:  Concentration: Fair  Recall:  [employer] of Knowledge:  Fair  Language:  Fair  Akathisia:  No  Handed:  Right  AIMS (if indicated):     Assets:  Desire for Improvement Resilience  ADL's:  Impaired  Cognition:  Impaired,  Mild  Sleep:         COGNITIVE FEATURES THAT CONTRIBUTE TO RISK:  Closed-mindedness    SUICIDE RISK:   Mild:  Suicidal ideation of limited frequency, intensity, duration,  and specificity.  There are no identifiable plans, no associated intent, mild dysphoria and related symptoms, good self-control (both objective and subjective assessment), few other risk factors, and identifiable protective factors, including available and accessible social support.  PLAN OF  CARE: Continue 15-minute checks.  Continue outpatient psychiatric medicines as best we can determine.  Alcohol withdrawal detox orders.  Reassess once she has gotten some rest and is not having any withdrawal.  I certify that inpatient services furnished can reasonably be expected to improve the patient's condition.   Mordecai Rasmussen, MD 08/29/2019, 5:41 PM

## 2019-08-29 NOTE — Progress Notes (Signed)
D: Received patient from [facility] Emergency Department. Patient skin assessment completed with Gigi, RN, skin is intact, no contraband found with all unit prohibited items locked and stored away for discharge. Pt. Was admitted under the services of, Dr. Weber Cooks.   A: Patient oriented to unit/room/call light. Pt. Given extensive admissions education. Patient was encourage to participate in unit activities and continue with plan of care being put into place. Q x 15 minute observation checks were initiated for safety.   R: Patient is receptive to treatment  being put into place and safety to be maintained on unit per MD orders.

## 2019-08-29 NOTE — ED Notes (Signed)
Patient informed RN that something was dripping in her room. This RN accompanied patient to room. Patient pointed to ceiling and said blood was dripping from ceiling. This RN informed patient that there was no liquid or blood dripping from ceiling.

## 2019-08-29 NOTE — H&P (Signed)
Psychiatric Admission Assessment Adult  Patient Identification: [name] MRN:  [id number] Date of Evaluation:  08/29/2019 Chief Complaint:  schizophrenia Principal Diagnosis: Schizophrenia (HCC) Diagnosis:  Principal Problem:   Schizophrenia (HCC) Active Problems:   Alcohol abuse  History of Present Illness: Patient seen chart reviewed.  33 year old woman with a diagnosis of schizophrenia and alcohol abuse.  Commitment petition filed claiming that she had texted suicidal threats to her sister and to her children.  Patient's chief complaint is "I was whooping and hollering when I was drunk".  Patient says she drinks heavily every day but yesterday was drinking even more than usual.  When confronted with the allegation of texting suicidal statements however she denies it.  She denies any suicidal or homicidal thought.  Patient is not an easy historian.  Sleepy and mutters much of the time.  Not necessarily responding to internal stimuli but does not seem very forthcoming.  She says that sometimes she has hallucinations.  Will not describe any detail about them.  She says that usually she takes her medicines regularly but she cannot remember exactly whether she has been taking them lately.  Denies any drug use except alcohol.  Blood alcohol level was only about 50 on presentation to the emergency room.  Patient says she does not know whether she is ever had any symptoms of alcohol withdrawal Associated Signs/Symptoms: Depression Symptoms:  difficulty concentrating, impaired memory, suicidal thoughts without plan, (Hypo) Manic Symptoms:  Irritable Mood, Labiality of Mood, Anxiety Symptoms:  None specifically identified Psychotic Symptoms:  Hallucinations: Auditory Paranoia, PTSD Symptoms: Negative Total Time spent with patient: 1 hour  Past Psychiatric History: Patient has had previous hospitalizations at old [facility].  We do not have access to those records.  She has been seen in the  emergency room a couple times recently but has been released both of those other times.  She says she gets her outpatient psychiatric treatment from the [facility] clinic.  She is listed as being just on 1 mg of Risperdal and some trazodone.  She denies any history of suicide attempts or violence.  Denies any history of treatment for substance abuse.  She says she does not know whether she is ever had serious alcohol withdrawal symptoms  Is the patient at risk to self? Yes.    Has the patient been a risk to self in the past 6 months? Yes.    Has the patient been a risk to self within the distant past? Yes.    Is the patient a risk to others? No.  Has the patient been a risk to others in the past 6 months? No.  Has the patient been a risk to others within the distant past? No.   Prior Inpatient Therapy:   Prior Outpatient Therapy:    Alcohol Screening: 1. How often do you have a drink containing alcohol?: 4 or more times a week 2. How many drinks containing alcohol do you have on a typical day when you are drinking?: 7, 8, or 9 3. How often do you have six or more drinks on one occasion?: Daily or almost daily AUDIT-C Score: 11 4. How often during the last year have you found that you were not able to stop drinking once you had started?: Never 5. How often during the last year have you failed to do what was normally expected from you becasue of drinking?: Never 6. How often during the last year have you needed a first drink in the  morning to get yourself going after a heavy drinking session?: Never 7. How often during the last year have you had a feeling of guilt of remorse after drinking?: Less than monthly 8. How often during the last year have you been unable to remember what happened the night before because you had been drinking?: Less than monthly 9. Have you or someone else been injured as a result of your drinking?: No 10. Has a relative or friend or a doctor or another health worker  been concerned about your drinking or suggested you cut down?: Yes, during the last year Alcohol Use Disorder Identification Test Final Score (AUDIT): 17 Alcohol Brief Interventions/Follow-up: Alcohol Education, Continued Monitoring, Medication Offered/Prescribed Substance Abuse History in the last 12 months:  Yes.   Consequences of Substance Abuse: Medical Consequences:  Seems to have caused her to get into this condition at least Previous Psychotropic Medications: Yes  Psychological Evaluations: Yes  Past Medical History:  Past Medical History:  Diagnosis Date  . Abdominal pain affecting pregnancy, antepartum 02/28/2016  . Anemia    LAST HGB 04-03-17 12.5  . Anxiety   . Depression   . Gallstones 2018  . Incisional ventral hernia w obstruction   . Obesity 09/28/2013   Last Assessment & Plan:  Obesity in pregnancy is associated with maternal and obstetric complications including increased risk of gestational DM, gestational HTN, preeclampsia, thromboembolism, failed induction of labor, Cesarean section, excessive gestational weight gain and post-partum weight retention. Obesity is also associated with fetal complications including prematurity, stillbirth, congen  . Post-operative state 30-Jun-2016  . SIDS (sudden infant death syndrome) 2013-09-28   Last Assessment & Plan:  Ms. Fraley third son died at three months of age from [place]. I reviewed with Ms. Filosa that the cause of SIDS is unknown. I did review with her that placing infants to sleep on their backs without soft bedding and tobacco cessation are potentially modifiable risk factors to decrease her risk of SIDS.  Marland Kitchen Small bowel obstruction ([place])   . Supervision of high risk pregnancy in third trimester 09-28-13   Last Assessment & Plan:  Ms. Kessenich is receiving her care at [facility]. She reports having an appointment tomorrow 12/3. I encouraged her to get a flu and TDAP vaccination. She is due for her CBC, RPR, third trimester HIV  and glucola.   . Tobacco abuse September 28, 2013   Last Assessment & Plan:  I discussed with Ms. Hoeger that the risks of smoking in pregnancy include spontaneous pregnancy loss, placental abruption, preterm premature rupture of membranes (PPROM), placenta previa, preterm labor and delivery, and low birth weight (LBW) as well as SIDS. I discussed with her that her tobacco use is a modifiable risk factor for risk of placental abruption and SIDS, b    Past Surgical History:  Procedure Laterality Date  . CESAREAN SECTION N/A 06/12/2016   Procedure: STAT CESAREAN SECTION WITH BILATERAL STERILIZATION;  Surgeon: Boykin Nearing, MD;  Location: [facility] ORS;  Service: Obstetrics;  Laterality: N/A;  Baby Girl @ 2246 Weight 5 lb 10 oz Apgars 8/9  . ENDOSCOPIC RETROGRADE CHOLANGIOPANCREATOGRAPHY (ERCP) WITH PROPOFOL N/A 06/06/2017   Procedure: ENDOSCOPIC RETROGRADE CHOLANGIOPANCREATOGRAPHY (ERCP) WITH PROPOFOL;  Surgeon: Lucilla Lame, MD;  Location: [facility] ENDOSCOPY;  Service: Endoscopy;  Laterality: N/A;  . ERCP N/A 10/30/2017   Procedure: ENDOSCOPIC RETROGRADE CHOLANGIOPANCREATOGRAPHY (ERCP) STENT REMOVAL;  Surgeon: Lucilla Lame, MD;  Location: [facility] ENDOSCOPY;  Service: Endoscopy;  Laterality: N/A;  . INCISIONAL HERNIA REPAIR N/A 05/07/2017  Procedure: LAPAROSCOPIC INCISIONAL HERNIA;  Surgeon: Henrene DodgePiscoya, Jose, MD;  Location: [facility] ORS;  Service: General;  Laterality: N/A;  . TUBAL LIGATION    . VENTRAL HERNIA REPAIR N/A 06/19/2016   Procedure: HERNIA REPAIR VENTRAL ADULT;  Surgeon: Lattie Hawichard E Cooper, MD;  Location: [facility] ORS;  Service: General;  Laterality: N/A;   Family History:  Family History  Problem Relation Age of Onset  . Diabetes Mother   . Asthma Sister   . Hypertension Maternal Aunt   . Hypertension Maternal Uncle    Family Psychiatric  History: Does not know of any Tobacco Screening: Have you used any form of tobacco in the last 30 days? (Cigarettes, Smokeless Tobacco, Cigars, and/or Pipes):  Yes Tobacco use, Select all that apply: 5 or more cigarettes per day Are you interested in Tobacco Cessation Medications?: Yes, will notify MD for an order Counseled patient on smoking cessation including recognizing danger situations, developing coping skills and basic information about quitting provided: Yes Social History:  Social History   Substance and Sexual Activity  Alcohol Use Yes  . Alcohol/week: 7.0 standard drinks  . Types: 7 Cans of beer per week   Comment: 2 40oz/day     Social History   Substance and Sexual Activity  Drug Use Yes   Comment: "pills"    Additional Social History:                           Allergies:   Allergies  Allergen Reactions  . Amoxicillin   . Vicodin [Hydrocodone-Acetaminophen]    Lab Results:  Results for orders placed or performed during the hospital encounter of 08/28/19 (from the past 48 hour(s))  CBC     Status: Abnormal   Collection Time: 08/28/19  7:31 PM  Result Value Ref Range   WBC 12.9 (H) 4.0 - 10.5 K/uL   RBC 5.03 3.87 - 5.11 MIL/uL   Hemoglobin 13.9 12.0 - 15.0 g/dL   HCT 11.940.6 14.736.0 - 82.946.0 %   MCV 80.7 80.0 - 100.0 fL   MCH 27.6 26.0 - 34.0 pg   MCHC 34.2 30.0 - 36.0 g/dL   RDW 56.214.0 13.011.5 - 86.515.5 %   Platelets 366 150 - 400 K/uL   nRBC 0.0 0.0 - 0.2 %    Comment: Performed at [facility], [address] [place], [place] [id number]  Comprehensive metabolic panel     Status: Abnormal   Collection Time: 08/28/19  7:31 PM  Result Value Ref Range   Sodium 132 (L) 135 - 145 mmol/L   Potassium 3.0 (L) 3.5 - 5.1 mmol/L   Chloride 98 98 - 111 mmol/L   CO2 19 (L) 22 - 32 mmol/L   Glucose, Bld 123 (H) 70 - 99 mg/dL   BUN <5 (L) 6 - 20 mg/dL   Creatinine, Ser 6.290.54 0.44 - 1.00 mg/dL   Calcium 9.5 8.9 - 52.810.3 mg/dL   Total Protein 8.8 (H) 6.5 - 8.1 g/dL   Albumin 4.2 3.5 - 5.0 g/dL   AST 44 (H) 15 - 41 U/L   ALT 34 0 - 44 U/L   Alkaline Phosphatase 76 38 - 126 U/L   Total Bilirubin 1.2 0.3 - 1.2 mg/dL    GFR calc non Af Amer >60 >60 mL/min   GFR calc Af Amer >60 >60 mL/min   Anion gap 15 5 - 15    Comment: Performed at [facility], [address] [place],  [place] [address]  Ethanol     Status: Abnormal   Collection Time: 08/28/19  7:31 PM  Result Value Ref Range   Alcohol, Ethyl (B) 50 (H) <10 mg/dL    Comment: (NOTE) Lowest detectable limit for serum alcohol is 10 mg/dL. For medical purposes only. Performed at [facility], [address] [place], [place] [address]   Urinalysis, Complete w Microscopic     Status: Abnormal   Collection Time: 08/28/19  7:31 PM  Result Value Ref Range   Color, Urine STRAW (A) YELLOW   APPearance CLEAR (A) CLEAR   Specific Gravity, Urine 1.000 (L) 1.005 - 1.030   pH 6.0 5.0 - 8.0   Glucose, UA NEGATIVE NEGATIVE mg/dL   Hgb urine dipstick SMALL (A) NEGATIVE   Bilirubin Urine NEGATIVE NEGATIVE   Ketones, ur NEGATIVE NEGATIVE mg/dL   Protein, ur NEGATIVE NEGATIVE mg/dL   Nitrite NEGATIVE NEGATIVE   Leukocytes,Ua NEGATIVE NEGATIVE   RBC / HPF 0-5 0 - 5 RBC/hpf   WBC, UA 0-5 0 - 5 WBC/hpf   Bacteria, UA RARE (A) NONE SEEN   Squamous Epithelial / LPF 0-5 0 - 5    Comment: Performed at [facility], [address], [place], [place] [address]  Urine Drug Screen, Qualitative ([facility] only)     Status: None   Collection Time: 08/28/19  7:31 PM  Result Value Ref Range   Tricyclic, Ur Screen NONE DETECTED NONE DETECTED   Amphetamines, Ur Screen NONE DETECTED NONE DETECTED   MDMA (Ecstasy)Ur Screen NONE DETECTED NONE DETECTED   Cocaine Metabolite,Ur [place] NONE DETECTED NONE DETECTED   Opiate, Ur Screen NONE DETECTED NONE DETECTED   Phencyclidine (PCP) Ur S NONE DETECTED NONE DETECTED   Cannabinoid 50 Ng, Ur  NONE DETECTED NONE DETECTED   Barbiturates, Ur Screen NONE DETECTED NONE DETECTED   Benzodiazepine, Ur Scrn NONE DETECTED NONE DETECTED   Methadone Scn, Ur NONE DETECTED NONE DETECTED    Comment: (NOTE) Tricyclics +  metabolites, urine    Cutoff 1000 ng/mL Amphetamines + metabolites, urine  Cutoff 1000 ng/mL MDMA (Ecstasy), urine              Cutoff 500 ng/mL Cocaine Metabolite, urine          Cutoff 300 ng/mL Opiate + metabolites, urine        Cutoff 300 ng/mL Phencyclidine (PCP), urine         Cutoff 25 ng/mL Cannabinoid, urine                 Cutoff 50 ng/mL Barbiturates + metabolites, urine  Cutoff 200 ng/mL Benzodiazepine, urine              Cutoff 200 ng/mL Methadone, urine                   Cutoff 300 ng/mL The urine drug screen provides only a preliminary, unconfirmed analytical test result and should not be used for non-medical purposes. Clinical consideration and professional judgment should be applied to any positive drug screen result due to possible interfering substances. A more specific alternate chemical method must be used in order to obtain a confirmed analytical result. Gas chromatography / mass spectrometry (GC/MS) is the preferred confirmat ory method. Performed at [facility], [address] [place], [place] [address]   Pregnancy, urine POC     Status: None   Collection Time: 08/28/19  7:50 PM  Result Value Ref Range   Preg Test, Ur NEGATIVE  NEGATIVE    Comment:        THE SENSITIVITY OF THIS METHODOLOGY IS >24 mIU/mL   SARS CORONAVIRUS 2 (TAT 6-24 HRS) Nasopharyngeal Nasopharyngeal Swab     Status: None   Collection Time: 08/28/19  8:47 PM   Specimen: Nasopharyngeal Swab  Result Value Ref Range   SARS Coronavirus 2 NEGATIVE NEGATIVE    Comment: (NOTE) SARS-CoV-2 target nucleic acids are NOT DETECTED. The SARS-CoV-2 RNA is generally detectable in upper and lower respiratory specimens during the acute phase of infection. Negative results do not preclude SARS-CoV-2 infection, do not rule out co-infections with other pathogens, and should not be used as the sole basis for treatment or other patient management decisions. Negative results must be combined  with clinical observations, patient history, and epidemiological information. The expected result is Negative. Fact Sheet for Patients: HairSlick.no Fact Sheet for Healthcare Providers: quierodirigir.com This test is not yet approved or cleared by the [place] FDA and  has been authorized for detection and/or diagnosis of SARS-CoV-2 by FDA under an Emergency Use Authorization (EUA). This EUA will remain  in effect (meaning this test can be used) for the duration of the COVID-19 declaration under Section 56 4(b)(1) of the Act, 21 U.S.C. section 360bbb-3(b)(1), unless the authorization is terminated or revoked sooner. Performed at [facility], [address], [place], [place] [address]     Blood Alcohol level:  Lab Results  Component Value Date   ETH 50 (H) 08/28/2019   ETH 187 (H) 08/22/2019    Metabolic Disorder Labs:  No results found for: HGBA1C, MPG No results found for: PROLACTIN No results found for: CHOL, TRIG, HDL, CHOLHDL, VLDL, LDLCALC  Current Medications: Current Facility-Administered Medications  Medication Dose Route Frequency Provider Last Rate Last Dose  . alum & mag hydroxide-simeth (MAALOX/MYLANTA) 200-200-20 MG/5ML suspension 30 mL  30 mL Oral Q4H PRN Cristofano, Paul A, MD      . magnesium hydroxide (MILK OF MAGNESIA) suspension 30 mL  30 mL Oral Daily PRN Cristofano, Worthy Rancher, MD      . Melene Muller ON 08/30/2019] nicotine (NICODERM CQ - dosed in mg/24 hours) patch 21 mg  21 mg Transdermal Daily Clapacs, John T, MD      . risperiDONE (RISPERDAL) tablet 1 mg  1 mg Oral BID Cristofano, Worthy Rancher, MD       PTA Medications: Medications Prior to Admission  Medication Sig Dispense Refill Last Dose  . risperiDONE (RISPERDAL) 1 MG tablet Take 1 mg by mouth 2 (two) times daily.     . traZODone (DESYREL) 150 MG tablet Take 150 mg by mouth at bedtime.       Musculoskeletal: Strength & Muscle Tone: within  normal limits Gait & Station: normal Patient leans: N/A  Psychiatric Specialty Exam: Physical Exam  Nursing note and vitals reviewed. Constitutional: She appears well-developed and well-nourished.  HENT:  Head: Normocephalic and atraumatic.  Eyes: Pupils are equal, round, and reactive to light. Conjunctivae are normal.  Neck: Normal range of motion.  Cardiovascular: Regular rhythm and normal heart sounds.  Respiratory: Effort normal. No respiratory distress.  GI: Soft.  Musculoskeletal: Normal range of motion.  Neurological: She is alert.  Skin: Skin is warm and dry.  Psychiatric: Her affect is blunt. Her speech is delayed. She is slowed. Thought content is not paranoid. Cognition and memory are impaired. She expresses inappropriate judgment. She expresses no homicidal and no suicidal ideation.    Review of Systems  Constitutional: Negative.  HENT: Negative.   Eyes: Negative.   Respiratory: Negative.   Cardiovascular: Negative.   Gastrointestinal: Negative.   Musculoskeletal: Negative.   Skin: Negative.   Neurological: Negative.   Psychiatric/Behavioral: Positive for hallucinations, memory loss and substance abuse. Negative for depression and suicidal ideas. The patient is not nervous/anxious and does not have insomnia.     Blood pressure (!) 110/96, pulse (!) 102, temperature 98.2 F (36.8 C), temperature source Oral, resp. rate 18, height  (1.626 m), weight 122 kg, last menstrual period 08/21/2019, SpO2 99 %.Body mass index is 46.17 kg/m.  General Appearance: Disheveled  Eye Contact:  Minimal  Speech:  Slow  Volume:  Decreased  Mood:  Depressed  Affect:  Constricted  Thought Process:  Disorganized  Orientation:  Full (Time, Place, and Person)  Thought Content:  Illogical  Suicidal Thoughts:  No  Homicidal Thoughts:  No  Memory:  Immediate;   Fair Recent;   Fair Remote;   Poor  Judgement:  Impaired  Insight:  Lacking  Psychomotor Activity:  Decreased   Concentration:  Concentration: Fair  Recall:  [employer] of Knowledge:  Fair  Language:  Fair  Akathisia:  No  Handed:  Right  AIMS (if indicated):     Assets:  Desire for Improvement Resilience  ADL's:  Impaired  Cognition:  Impaired,  Mild  Sleep:       Treatment Plan Summary: Daily contact with patient to assess and evaluate symptoms and progress in treatment, Medication management and Plan 33 year old woman who came to the hospital somewhat intoxicated with reports that she has been texting suicidal statements.  The patient is having done that but admits that she was intoxicated in public and making a scene.  Patient currently is not a good historian.  Tired.  Moderate is much of the time.  Has been cooperative with treatment coming into the hospital.  Although she reportedly has a diagnosis of schizophrenia the whole history and diagnosis still seem a little unclear.  I will continue her Risperdal and trazodone.  Continue 15-minute checks.  Alcohol withdrawal orders will be placed if needed.  Patient can be reassessed after she gets a little rest.  Observation Level/Precautions:  15 minute checks  Laboratory:  Chemistry Profile  Psychotherapy:    Medications:    Consultations:    Discharge Concerns:    Estimated LOS:  Other:     Physician Treatment Plan for Primary Diagnosis: Schizophrenia (HCC) Long Term Goal(s): Improvement in symptoms so as ready for discharge  Short Term Goals: Ability to identify changes in lifestyle to reduce recurrence of condition will improve, Ability to verbalize feelings will improve and Ability to disclose and discuss suicidal ideas  Physician Treatment Plan for Secondary Diagnosis: Principal Problem:   Schizophrenia (HCC) Active Problems:   Alcohol abuse  Long Term Goal(s): Improvement in symptoms so as ready for discharge  Short Term Goals: Compliance with prescribed medications will improve and Ability to identify triggers associated with  substance abuse/mental health issues will improve  I certify that inpatient services furnished can reasonably be expected to improve the patient's condition.    Mordecai Rasmussen, MD 11/20/20206:02 PM

## 2019-08-29 NOTE — ED Notes (Addendum)
Pt discharged under IVC to BMU. VS stable. All belongings sent with patient.  Report given to [place], Therapist, sports. Pt cooperative with transfer.

## 2019-08-29 NOTE — Tx Team (Signed)
Initial Treatment Plan 08/29/2019 6:10 PM [name] [id number]    PATIENT STRESSORS: Medication change or noncompliance Substance abuse Other: suicidal thoughts, homicidal thoughts, psychosis   PATIENT STRENGTHS: Ability for insight Supportive family/friends   PATIENT IDENTIFIED PROBLEMS: Medication non-compliance 08/29/2019  Psychosis 08/29/2019  Suicidal thoughts 08/29/2019  Homicidal thoughts 08/29/2019  Substance 08/29/2019             DISCHARGE CRITERIA:  Improved stabilization in mood, thinking, and/or behavior Motivation to continue treatment in a less acute level of care Need for constant or close observation no longer present Verbal commitment to aftercare and medication compliance Withdrawal symptoms are absent or subacute and managed without 24-hour nursing intervention  PRELIMINARY DISCHARGE PLAN: Outpatient therapy Return to previous living arrangement Return to previous work or school arrangements  PATIENT/FAMILY INVOLVEMENT: This treatment plan has been presented to and reviewed with the patient, [name].The patient has been given the opportunity to ask questions and make suggestions.  Reyes Ivan, RN 08/29/2019, 6:10 PM

## 2019-08-29 NOTE — ED Provider Notes (Signed)
-----------------------------------------   1:31 PM on 08/29/2019 -----------------------------------------  Patient has been seen and evaluated by psychiatry they will be admitting to their service once a bed becomes available.   Harvest Dark, MD 08/29/19 1331

## 2019-08-29 NOTE — BH Assessment (Signed)
Patient is to be admitted to [facility] BMU by Dr. Ainsley Spinner.  Attending Physician will be Dr. Weber Cooks.   Patient has been assigned to room 306, by [place] Nurse [place].   ER staff is aware of the admission:  [facility] ER Secretary    Dr. Kerman Passey, ER MD   Amy B Patient's Nurse   Elberta Fortis Patient Access.

## 2019-08-29 NOTE — Plan of Care (Signed)
Patient just recently admitted to the unit. Patient has not had sufficient time to show progressions at this time. Will continue to monitor for progressions.   Problem: Education: Goal: Knowledge of [facility] Education information/materials will improve Outcome: Not Progressing Goal: Emotional status will improve Outcome: Not Progressing Goal: Mental status will improve Outcome: Not Progressing   Problem: Health Behavior/Discharge Planning: Goal: Compliance with treatment plan for underlying cause of condition will improve Outcome: Not Progressing   Problem: Safety: Goal: Periods of time without injury will increase Outcome: Not Progressing   Problem: Education: Goal: Will be free of psychotic symptoms Outcome: Not Progressing   Problem: Health Behavior/Discharge Planning: Goal: Ability to remain free from injury will improve Outcome: Not Progressing

## 2019-08-29 NOTE — BH Assessment (Addendum)
Assessment Note  [name] is an 33 y.o. female presenting to the Emergency department due to active auditory hallucinations and delusions. Pt reports that she has been having AH "for months now" and reports that her voices are "talking in my ear" and "trying to poison me." Pt reports that she currently lives with family and has 3 children, reports only other support is her mother. Pt reports that she has dreams about killing other people, but was unable to recall if she has ever followed through with harming other people. Pt reports a previous suicide attempt "last month" via "pills and Clorox" and was admitted due to her attempt. During assessment pt had difficulty remembering things such as how long she has been hearing voices, it appeared that she had some thought blocking during times when she would stop talking and answer questions with "I don't know." Pt appearance was disheveled and had body odor.   Diagnosis: Schizophrenia  Past Medical History:  Past Medical History:  Diagnosis Date  . Abdominal pain affecting pregnancy, antepartum 02/28/2016  . Anemia    LAST HGB 04-03-17 12.5  . Anxiety   . Depression   . Gallstones 2018  . Incisional ventral hernia w obstruction   . Obesity 09/18/2013   Last Assessment & Plan:  Obesity in pregnancy is associated with maternal and obstetric complications including increased risk of gestational DM, gestational HTN, preeclampsia, thromboembolism, failed induction of labor, Cesarean section, excessive gestational weight gain and post-partum weight retention. Obesity is also associated with fetal complications including prematurity, stillbirth, congen  . Post-operative state 2016-06-20  . SIDS (sudden infant death syndrome) 09-18-13   Last Assessment & Plan:  Ms. Schaffer third son died at three months of age from SIDS. I reviewed with Ms. Wojdyla that the cause of SIDS is unknown. I did review with her that placing infants to sleep on their backs  without soft bedding and tobacco cessation are potentially modifiable risk factors to decrease her risk of SIDS.  Marland Kitchen Small bowel obstruction (HCC)   . Supervision of high risk pregnancy in third trimester 2013-09-18   Last Assessment & Plan:  Ms. Radloff is receiving her care at [facility]. She reports having an appointment tomorrow 12/3. I encouraged her to get a flu and TDAP vaccination. She is due for her CBC, RPR, third trimester HIV and glucola.   . Tobacco abuse 2013/09/18   Last Assessment & Plan:  I discussed with Ms. Holloran that the risks of smoking in pregnancy include spontaneous pregnancy loss, placental abruption, preterm premature rupture of membranes (PPROM), placenta previa, preterm labor and delivery, and low birth weight (LBW) as well as SIDS. I discussed with her that her tobacco use is a modifiable risk factor for risk of placental abruption and SIDS, b    Past Surgical History:  Procedure Laterality Date  . CESAREAN SECTION N/A 06/12/2016   Procedure: STAT CESAREAN SECTION WITH BILATERAL STERILIZATION;  Surgeon: Suzy Bouchard, MD;  Location: [facility] ORS;  Service: Obstetrics;  Laterality: N/A;  Baby Girl @ 2246 Weight 5 lb 10 oz Apgars 8/9  . ENDOSCOPIC RETROGRADE CHOLANGIOPANCREATOGRAPHY (ERCP) WITH PROPOFOL N/A 06/06/2017   Procedure: ENDOSCOPIC RETROGRADE CHOLANGIOPANCREATOGRAPHY (ERCP) WITH PROPOFOL;  Surgeon: Midge Minium, MD;  Location: [facility] ENDOSCOPY;  Service: Endoscopy;  Laterality: N/A;  . ERCP N/A 10/30/2017   Procedure: ENDOSCOPIC RETROGRADE CHOLANGIOPANCREATOGRAPHY (ERCP) STENT REMOVAL;  Surgeon: Midge Minium, MD;  Location: [facility] ENDOSCOPY;  Service: Endoscopy;  Laterality: N/A;  . INCISIONAL HERNIA REPAIR  N/A 05/07/2017   Procedure: LAPAROSCOPIC INCISIONAL HERNIA;  Surgeon: Olean Ree, MD;  Location: [facility] ORS;  Service: General;  Laterality: N/A;  . TUBAL LIGATION    . VENTRAL HERNIA REPAIR N/A 06/19/2016   Procedure: HERNIA REPAIR VENTRAL ADULT;   Surgeon: Florene Glen, MD;  Location: [facility] ORS;  Service: General;  Laterality: N/A;    Family History:  Family History  Problem Relation Age of Onset  . Diabetes Mother   . Asthma Sister   . Hypertension Maternal Aunt   . Hypertension Maternal Uncle     Social History:  reports that she has been smoking cigarettes. She has a 15.00 pack-year smoking history. She has never used smokeless tobacco. She reports current alcohol use of about 7.0 standard drinks of alcohol per week. She reports current drug use.  Additional Social History:  Alcohol / Drug Use Pain Medications: See PTA Prescriptions: See PTA Over the Counter: See PTA History of alcohol / drug use?: Yes Longest period of sobriety (when/how long): Unable to Quantify Substance #1 Name of Substance 1: Alcohol 1 - Age of First Use: "15 years ago" 33 years old 1 - Amount (size/oz): "3-4 72s" 1 - Frequency: "daily." 1 - Duration: "couple months." 1 - Last Use / Amount: "yesterday." 08/28/2019  CIWA: CIWA-Ar BP: (!) 147/75 Pulse Rate: 97 COWS:    Allergies:  Allergies  Allergen Reactions  . Amoxicillin   . Vicodin [Hydrocodone-Acetaminophen]     Home Medications: (Not in a hospital admission)   OB/GYN Status:  Patient's last menstrual period was 08/21/2019.  General Assessment Data Assessment unable to be completed: Yes Reason for not completing assessment: Pt is responding to internal stimuli and is unable to answer questions at this time. Location of Assessment: [facility] ED TTS Assessment: In system Is this a Tele or Face-to-Face Assessment?: Face-to-Face Is this an Initial Assessment or a Re-assessment for this encounter?: Initial Assessment Patient Accompanied by:: N/A Language Other than English: No Living Arrangements: (Private Home) What gender do you identify as?: Female Marital status: Single Pregnancy Status: No Living Arrangements: Children Can pt return to current living arrangement?:  Yes Admission Status: Involuntary Petitioner: ED Attending Is patient capable of signing voluntary admission?: No Referral Source: Self/Family/Friend Insurance type: Medicaid  Medical Screening Exam ([place]) Medical Exam completed: Yes  Crisis Care Plan Living Arrangements: Children Legal Guardian: Other:(Self) Name of Psychiatrist: None reported Name of Therapist: None Reported  Education Status Is patient currently in school?: No Is the patient employed, unemployed or receiving disability?: (Unknown)  Risk to self with the past 6 months Suicidal Ideation: No-Not Currently/Within Last 6 Months Has patient been a risk to self within the past 6 months prior to admission? : Yes Suicidal Intent: No-Not Currently/Within Last 6 Months Has patient had any suicidal intent within the past 6 months prior to admission? : Yes Is patient at risk for suicide?: No Suicidal Plan?: No Has patient had any suicidal plan within the past 6 months prior to admission? : Yes Access to Means: Yes Specify Access to Suicidal Means: Reports taking pills and clorox What has been your use of drugs/alcohol within the last 12 months?: Alcohol Previous Attempts/Gestures: Yes How many times?: 1 Other Self Harm Risks: Active addiction Triggers for Past Attempts: Hallucinations Intentional Self Injurious Behavior: None Family Suicide History: Unknown Recent stressful life event(s): Other (Comment)(Untreated Mental Illness) Persecutory voices/beliefs?: Yes Depression Symptoms: Isolating, Despondent Substance abuse history and/or treatment for substance abuse?: Yes  Risk to Others  within the past 6 months Homicidal Ideation: No-Not Currently/Within Last 6 Months Does patient have any lifetime risk of violence toward others beyond the six months prior to admission? : No Thoughts of Harm to Others: No-Not Currently Present/Within Last 6 Months Current Homicidal Intent: No-Not Currently/Within Last 6  Months Current Homicidal Plan: No-Not Currently/Within Last 6 Months Access to Homicidal Means: No Identified Victim: None History of harm to others?: No Assessment of Violence: None Noted Violent Behavior Description: None Does patient have access to weapons?: No Criminal Charges Pending?: No Does patient have a court date: No Is patient on probation?: No  Psychosis Hallucinations: Auditory Delusions: Persecutory, Grandiose  Mental Status Report Appearance/Hygiene: Disheveled, In scrubs, Body odor, Poor hygiene Eye Contact: Fair Motor Activity: Freedom of movement Speech: Pressured, Soft Level of Consciousness: Alert Mood: Pleasant, Preoccupied, Anxious Affect: Flat, Sad, Depressed, Appropriate to circumstance Anxiety Level: Moderate Thought Processes: Irrelevant, Thought Blocking, Coherent Judgement: Partial Orientation: Person, Appropriate for developmental age Obsessive Compulsive Thoughts/Behaviors: Minimal  Cognitive Functioning Concentration: Fair Memory: Recent Impaired, Remote Impaired Is patient IDD: No Insight: Poor Impulse Control: Fair Appetite: Good Have you had any weight changes? : No Change Sleep: Unable to Assess Vegetative Symptoms: None  ADLScreening [facility] Assessment Services) Patient's cognitive ability adequate to safely complete daily activities?: Yes Patient able to express need for assistance with ADLs?: Yes Independently performs ADLs?: Yes (appropriate for developmental age)  Prior Inpatient Therapy Prior Inpatient Therapy: Yes Prior Therapy Dates: 07/2019 [facility] Reason for Treatment: Paranoia   Prior Outpatient Therapy Prior Outpatient Therapy: No Does patient have an ACCT team?: No Does patient have Intensive In-House Services?  : No Does patient have [facility] services? : No Does patient have P4CC services?: No  ADL Screening (condition at time of  admission) Patient's cognitive ability adequate to safely complete daily activities?: Yes Is the patient deaf or have difficulty hearing?: No Does the patient have difficulty seeing, even when wearing glasses/contacts?: No Does the patient have difficulty concentrating, remembering, or making decisions?: No Patient able to express need for assistance with ADLs?: Yes Does the patient have difficulty dressing or bathing?: No Independently performs ADLs?: Yes (appropriate for developmental age) Does the patient have difficulty walking or climbing stairs?: No Weakness of Legs: None Weakness of Arms/Hands: None  Home Assistive Devices/Equipment Home Assistive Devices/Equipment: None  Therapy Consults (therapy consults require a physician order) PT Evaluation Needed: No OT Evalulation Needed: No SLP Evaluation Needed: No Abuse/Neglect Assessment (Assessment to be complete while patient is alone) Abuse/Neglect Assessment Can Be Completed: Unable to assess, patient is non-responsive or altered mental status Values / Beliefs Cultural Requests During Hospitalization: None Spiritual Requests During Hospitalization: None Consults Spiritual Care Consult Needed: No Social Work Consult Needed: No         Child/Adolescent Assessment Running Away Risk: Denies(Pt is an adult)  Disposition:  Disposition Initial Assessment Completed for this Encounter: Yes Disposition of Patient: Admit Type of inpatient treatment program: Adult Patient refused recommended treatment: No Mode of transportation if patient is discharged/movement?: Other (comment)(Wheelchair)  On Site Evaluation by:   Reviewed with Physician:    Benay PikeJamila A Mariavictoria Nottingham LCAS-A 08/29/2019 11:45 AM

## 2019-08-29 NOTE — BH Assessment (Signed)
Pt has been accepted to [facility] BMU Room 306 by [facility] [place].  Time and arrangement for transport is to be determined by the next shift.

## 2019-08-30 DIAGNOSIS — F203 Undifferentiated schizophrenia: Secondary | ICD-10-CM

## 2019-08-30 LAB — HEPATIC FUNCTION PANEL
ALT: 25 U/L (ref 0–44)
AST: 27 U/L (ref 15–41)
Albumin: 3.5 g/dL (ref 3.5–5.0)
Alkaline Phosphatase: 56 U/L (ref 38–126)
Bilirubin, Direct: 0.2 mg/dL (ref 0.0–0.2)
Indirect Bilirubin: 0.9 mg/dL (ref 0.3–0.9)
Total Bilirubin: 1.1 mg/dL (ref 0.3–1.2)
Total Protein: 7.5 g/dL (ref 6.5–8.1)

## 2019-08-30 LAB — LIPID PANEL
Cholesterol: 183 mg/dL (ref 0–200)
HDL: 56 mg/dL (ref >40)
LDL Cholesterol: 114 mg/dL — ABNORMAL HIGH (ref 0–99)
Total CHOL/HDL Ratio: 3.3 RATIO
Triglycerides: 66 mg/dL (ref <150)
VLDL: 13 mg/dL (ref 0–40)

## 2019-08-30 LAB — TSH: TSH: 4.153 u[IU]/mL (ref 0.350–4.500)

## 2019-08-30 MED ORDER — RISPERIDONE 1 MG PO TABS
2.0000 mg | ORAL_TABLET | Freq: Two times a day (BID) | ORAL | Status: DC
  Administered 2019-08-30 – 2019-09-02 (×7): 2 mg via ORAL
  Filled 2019-08-30 (×7): qty 2

## 2019-08-30 NOTE — Plan of Care (Signed)
  Problem: Education: Goal: Knowledge of [place] [facility] Education information/materials will improve Outcome: Progressing Goal: Emotional status will improve Outcome: Progressing Goal: Mental status will improve Outcome: Progressing  D: Patient said that she thinks there is something in her ear that is talking. No tremors but vomited during the night. Denies SI and HI. Restless. Up during the night. Complaining of being cold. Temp 99.3. A: Continue to monitor for safety R: Safety maintained.

## 2019-08-30 NOTE — Progress Notes (Signed)
[facility] MD Progress Note  08/30/2019 7:42 AM [name]  MRN:  [id number] Subjective:    [name] is a 33 year old patient with a history of alcohol abuse versus dependency, and a history of being diagnosed with a schizophrenic disorder.  She was petitioned for allegedly texting suicidal threats however she denies this  The patient states that she does not have any shakes or withdrawal symptoms.  She states she always "hears voices inside my head" and reports no medication that she can recall has resolved her hallucinations.  She historian but she is oriented to person place time day and date.  She states that "they took a warrant to bring me here" and confuses the petition for involuntary commitment with a warrant for arrest.  She blames any suicidal statements, if made, on a state of intoxication.  Principal Problem: Schizophrenia (HCC) Diagnosis: Principal Problem:   Schizophrenia (HCC) Active Problems:   Alcohol abuse  Total Time spent with patient: 20 minutes  Past Psychiatric History: Prior similar presentations by report  Past Medical History:  Past Medical History:  Diagnosis Date  . Abdominal pain affecting pregnancy, antepartum 02/28/2016  . Anemia    LAST HGB 04-03-17 12.5  . Anxiety   . Depression   . Gallstones 2018  . Incisional ventral hernia w obstruction   . Obesity October 04, 2013   Last Assessment & Plan:  Obesity in pregnancy is associated with maternal and obstetric complications including increased risk of gestational DM, gestational HTN, preeclampsia, thromboembolism, failed induction of labor, Cesarean section, excessive gestational weight gain and post-partum weight retention. Obesity is also associated with fetal complications including prematurity, stillbirth, congen  . Post-operative state 07/06/2016  . SIDS (sudden infant death syndrome) October 04, 2013   Last Assessment & Plan:  [name] third son died at three months of age from SIDS. I reviewed with Ms.  Dalgleish that the cause of SIDS is unknown. I did review with her that placing infants to sleep on their backs without soft bedding and tobacco cessation are potentially modifiable risk factors to decrease her risk of SIDS.  Marland Kitchen Small bowel obstruction (HCC)   . Supervision of high risk pregnancy in third trimester Oct 04, 2013   Last Assessment & Plan:  [name] is receiving her care at [facility]. She reports having an appointment tomorrow 12/3. I encouraged her to get a flu and TDAP vaccination. She is due for her CBC, RPR, third trimester HIV and glucola.   . Tobacco abuse 10/04/2013   Last Assessment & Plan:  I discussed with [name] that the risks of smoking in pregnancy include spontaneous pregnancy loss, placental abruption, preterm premature rupture of membranes (PPROM), placenta previa, preterm labor and delivery, and low birth weight (LBW) as well as SIDS. I discussed with her that her tobacco use is a modifiable risk factor for risk of placental abruption and SIDS, b    Past Surgical History:  Procedure Laterality Date  . CESAREAN SECTION N/A 06/12/2016   Procedure: STAT CESAREAN SECTION WITH BILATERAL STERILIZATION;  Surgeon: Suzy Bouchard, MD;  Location: [facility] ORS;  Service: Obstetrics;  Laterality: N/A;  Baby Girl @ 2246 Weight 5 lb 10 oz Apgars 8/9  . ENDOSCOPIC RETROGRADE CHOLANGIOPANCREATOGRAPHY (ERCP) WITH PROPOFOL N/A 06/06/2017   Procedure: ENDOSCOPIC RETROGRADE CHOLANGIOPANCREATOGRAPHY (ERCP) WITH PROPOFOL;  Surgeon: Midge Minium, MD;  Location: [facility] ENDOSCOPY;  Service: Endoscopy;  Laterality: N/A;  . ERCP N/A 10/30/2017   Procedure: ENDOSCOPIC RETROGRADE CHOLANGIOPANCREATOGRAPHY (ERCP) STENT REMOVAL;  Surgeon: Midge Minium,  MD;  Location: [facility] ENDOSCOPY;  Service: Endoscopy;  Laterality: N/A;  . INCISIONAL HERNIA REPAIR N/A 05/07/2017   Procedure: LAPAROSCOPIC INCISIONAL HERNIA;  Surgeon: Henrene Dodge, MD;  Location: [facility] ORS;  Service: General;  Laterality: N/A;  .  TUBAL LIGATION    . VENTRAL HERNIA REPAIR N/A 06/19/2016   Procedure: HERNIA REPAIR VENTRAL ADULT;  Surgeon: Lattie Haw, MD;  Location: [facility] ORS;  Service: General;  Laterality: N/A;   Family History:  Family History  Problem Relation Age of Onset  . Diabetes Mother   . Asthma Sister   . Hypertension Maternal Aunt   . Hypertension Maternal Uncle    Family Psychiatric  History: No data shared Social History:  Social History   Substance and Sexual Activity  Alcohol Use Yes  . Alcohol/week: 7.0 standard drinks  . Types: 7 Cans of beer per week   Comment: 2 40oz/day     Social History   Substance and Sexual Activity  Drug Use Yes   Comment: "pills"    Social History   Socioeconomic History  . Marital status: Single    Spouse name: Not on file  . Number of children: Not on file  . Years of education: Not on file  . Highest education level: Not on file  Occupational History  . Not on file  Social Needs  . Financial resource strain: Not on file  . Food insecurity    Worry: Not on file    Inability: Not on file  . Transportation needs    Medical: Not on file    Non-medical: Not on file  Tobacco Use  . Smoking status: Current Every Day Smoker    Packs/day: 1.50    Years: 10.00    Pack years: 15.00    Types: Cigarettes  . Smokeless tobacco: Never Used  Substance and Sexual Activity  . Alcohol use: Yes    Alcohol/week: 7.0 standard drinks    Types: 7 Cans of beer per week    Comment: 2 40oz/day  . Drug use: Yes    Comment: "pills"  . Sexual activity: Never  Lifestyle  . Physical activity    Days per week: Not on file    Minutes per session: Not on file  . Stress: Not on file  Relationships  . Social Musician on phone: Not on file    Gets together: Not on file    Attends religious service: Not on file    Active member of club or organization: Not on file    Attends meetings of clubs or organizations: Not on file    Relationship status: Not  on file  Other Topics Concern  . Not on file  Social History Narrative  . Not on file   Additional Social History:                         Sleep: Good  Appetite:  Good  Current Medications: Current Facility-Administered Medications  Medication Dose Route Frequency Provider Last Rate Last Dose  . alum & mag hydroxide-simeth (MAALOX/MYLANTA) 200-200-20 MG/5ML suspension 30 mL  30 mL Oral Q4H PRN Cristofano, Paul A, MD      . hydrOXYzine (ATARAX/VISTARIL) tablet 25 mg  25 mg Oral Q6H PRN Clapacs, Jackquline Denmark, MD   25 mg at 08/30/19 0037  . loperamide (IMODIUM) capsule 2-4 mg  2-4 mg Oral PRN Clapacs, Jackquline Denmark, MD      . LORazepam (ATIVAN)  tablet 1 mg  1 mg Oral Q6H PRN Clapacs, Madie Reno, MD   1 mg at 08/29/19 2109  . magnesium hydroxide (MILK OF MAGNESIA) suspension 30 mL  30 mL Oral Daily PRN Cristofano, Dorene Ar, MD      . multivitamin with minerals tablet 1 tablet  1 tablet Oral Daily Clapacs, Madie Reno, MD   1 tablet at 08/29/19 2109  . nicotine (NICODERM CQ - dosed in mg/24 hours) patch 21 mg  21 mg Transdermal Daily Clapacs, John T, MD      . ondansetron (ZOFRAN-ODT) disintegrating tablet 4 mg  4 mg Oral Q6H PRN Clapacs, John T, MD      . risperiDONE (RISPERDAL) tablet 2 mg  2 mg Oral BID Johnn Hai, MD      . thiamine (VITAMIN B-1) tablet 100 mg  100 mg Oral Daily Clapacs, Madie Reno, MD   100 mg at 08/29/19 2110    Lab Results:  Results for orders placed or performed during the hospital encounter of 08/28/19 (from the past 48 hour(s))  CBC     Status: Abnormal   Collection Time: 08/28/19  7:31 PM  Result Value Ref Range   WBC 12.9 (H) 4.0 - 10.5 K/uL   RBC 5.03 3.87 - 5.11 MIL/uL   Hemoglobin 13.9 12.0 - 15.0 g/dL   HCT 40.6 36.0 - 46.0 %   MCV 80.7 80.0 - 100.0 fL   MCH 27.6 26.0 - 34.0 pg   MCHC 34.2 30.0 - 36.0 g/dL   RDW 14.0 11.5 - 15.5 %   Platelets 366 150 - 400 K/uL   nRBC 0.0 0.0 - 0.2 %    Comment: Performed at [facility], [address]  [place], [place] [address]  Comprehensive metabolic panel     Status: Abnormal   Collection Time: 08/28/19  7:31 PM  Result Value Ref Range   Sodium 132 (L) 135 - 145 mmol/L   Potassium 3.0 (L) 3.5 - 5.1 mmol/L   Chloride 98 98 - 111 mmol/L   CO2 19 (L) 22 - 32 mmol/L   Glucose, Bld 123 (H) 70 - 99 mg/dL   BUN <5 (L) 6 - 20 mg/dL   Creatinine, Ser 0.54 0.44 - 1.00 mg/dL   Calcium 9.5 8.9 - 10.3 mg/dL   Total Protein 8.8 (H) 6.5 - 8.1 g/dL   Albumin 4.2 3.5 - 5.0 g/dL   AST 44 (H) 15 - 41 U/L   ALT 34 0 - 44 U/L   Alkaline Phosphatase 76 38 - 126 U/L   Total Bilirubin 1.2 0.3 - 1.2 mg/dL   GFR calc non Af Amer >60 >60 mL/min   GFR calc Af Amer >60 >60 mL/min   Anion gap 15 5 - 15    Comment: Performed at [facility], [address], [place], [place] [address]  Ethanol     Status: Abnormal   Collection Time: 08/28/19  7:31 PM  Result Value Ref Range   Alcohol, Ethyl (B) 50 (H) <10 mg/dL    Comment: (NOTE) Lowest detectable limit for serum alcohol is 10 mg/dL. For medical purposes only. Performed at [facility], [address] [place], [place] [address]   Urinalysis, Complete w Microscopic     Status: Abnormal   Collection Time: 08/28/19  7:31 PM  Result Value Ref Range   Color, Urine STRAW (A) YELLOW   APPearance CLEAR (A) CLEAR   Specific Gravity, Urine 1.000 (L) 1.005 - 1.030  pH 6.0 5.0 - 8.0   Glucose, UA NEGATIVE NEGATIVE mg/dL   Hgb urine dipstick SMALL (A) NEGATIVE   Bilirubin Urine NEGATIVE NEGATIVE   Ketones, ur NEGATIVE NEGATIVE mg/dL   Protein, ur NEGATIVE NEGATIVE mg/dL   Nitrite NEGATIVE NEGATIVE   Leukocytes,Ua NEGATIVE NEGATIVE   RBC / HPF 0-5 0 - 5 RBC/hpf   WBC, UA 0-5 0 - 5 WBC/hpf   Bacteria, UA RARE (A) NONE SEEN   Squamous Epithelial / LPF 0-5 0 - 5    Comment: Performed at [facility], [address] [place], [place] [address]  Urine Drug Screen, Qualitative ([facility] only)     Status: None   Collection Time: 08/28/19   7:31 PM  Result Value Ref Range   Tricyclic, Ur Screen NONE DETECTED NONE DETECTED   Amphetamines, Ur Screen NONE DETECTED NONE DETECTED   MDMA (Ecstasy)Ur Screen NONE DETECTED NONE DETECTED   Cocaine Metabolite,Ur [place] NONE DETECTED NONE DETECTED   Opiate, Ur Screen NONE DETECTED NONE DETECTED   Phencyclidine (PCP) Ur S NONE DETECTED NONE DETECTED   Cannabinoid 50 Ng, Ur [place] NONE DETECTED NONE DETECTED   Barbiturates, Ur Screen NONE DETECTED NONE DETECTED   Benzodiazepine, Ur Scrn NONE DETECTED NONE DETECTED   Methadone Scn, Ur NONE DETECTED NONE DETECTED    Comment: (NOTE) Tricyclics + metabolites, urine    Cutoff 1000 ng/mL Amphetamines + metabolites, urine  Cutoff 1000 ng/mL MDMA (Ecstasy), urine              Cutoff 500 ng/mL Cocaine Metabolite, urine          Cutoff 300 ng/mL Opiate + metabolites, urine        Cutoff 300 ng/mL Phencyclidine (PCP), urine         Cutoff 25 ng/mL Cannabinoid, urine                 Cutoff 50 ng/mL Barbiturates + metabolites, urine  Cutoff 200 ng/mL Benzodiazepine, urine              Cutoff 200 ng/mL Methadone, urine                   Cutoff 300 ng/mL The urine drug screen provides only a preliminary, unconfirmed analytical test result and should not be used for non-medical purposes. Clinical consideration and professional judgment should be applied to any positive drug screen result due to possible interfering substances. A more specific alternate chemical method must be used in order to obtain a confirmed analytical result. Gas chromatography / mass spectrometry (GC/MS) is the preferred confirmat ory method. Performed at [facility], [address] [place], [place] [address]   Pregnancy, urine POC     Status: None   Collection Time: 08/28/19  7:50 PM  Result Value Ref Range   Preg Test, Ur NEGATIVE NEGATIVE    Comment:        THE SENSITIVITY OF THIS METHODOLOGY IS >24 mIU/mL   SARS CORONAVIRUS 2 (TAT 6-24 HRS) Nasopharyngeal  Nasopharyngeal Swab     Status: None   Collection Time: 08/28/19  8:47 PM   Specimen: Nasopharyngeal Swab  Result Value Ref Range   SARS Coronavirus 2 NEGATIVE NEGATIVE    Comment: (NOTE) SARS-CoV-2 target nucleic acids are NOT DETECTED. The SARS-CoV-2 RNA is generally detectable in upper and lower respiratory specimens during the acute phase of infection. Negative results do not preclude SARS-CoV-2 infection, do not rule out co-infections with other pathogens, and should not be  used as the sole basis for treatment or other patient management decisions. Negative results must be combined with clinical observations, patient history, and epidemiological information. The expected result is Negative. Fact Sheet for Patients: HairSlick.nohttps://www.fda.gov/media/138098/download Fact Sheet for Healthcare Providers: quierodirigir.comhttps://www.fda.gov/media/138095/download This test is not yet approved or cleared by the [place] FDA and  has been authorized for detection and/or diagnosis of SARS-CoV-2 by FDA under an Emergency Use Authorization (EUA). This EUA will remain  in effect (meaning this test can be used) for the duration of the COVID-19 declaration under Section 56 4(b)(1) of the Act, 21 U.S.C. section 360bbb-3(b)(1), unless the authorization is terminated or revoked sooner. Performed at [facility], [address] [place], [place] [id number]     Blood Alcohol level:  Lab Results  Component Value Date   ETH 50 (H) 08/28/2019   ETH 187 (H) 08/22/2019    Metabolic Disorder Labs: No results found for: HGBA1C, MPG No results found for: PROLACTIN No results found for: CHOL, TRIG, HDL, CHOLHDL, VLDL, LDLCALC  Physical Findings: AIMS:  , ,  ,  ,    CIWA:  CIWA-Ar Total: 1 COWS:     Musculoskeletal: Strength & Muscle Tone: within normal limits Gait & Station: normal Patient leans: N/A  Psychiatric Specialty Exam: Physical Exam  ROS  Blood pressure (!) 153/88, pulse 99, temperature  98.6 F (37 C), temperature source Oral, resp. rate 18, height 5\' 4"  (1.626 m), weight 122 kg, last menstrual period 08/21/2019, SpO2 99 %.Body mass index is 46.17 kg/m.  General Appearance: Casual and Disheveled  Eye Contact:  None  Speech:  Mumbling speech  Volume:  Decreased  Mood:  Euthymic  Affect:  Constricted  Thought Process:  Linear and Descriptions of Associations: Circumstantial  Orientation:  Full (Time, Place, and Person)  Thought Content:  Denies current auditory or visual hallucinations  Suicidal Thoughts:  No  Homicidal Thoughts:  No  Memory:  Immediate;   Fair Recent;   Fair Remote;   Fair  Judgement:  Fair  Insight:  Fair  Psychomotor Activity:  Normal  Concentration:  Concentration: Fair and Attention Span: Fair  Recall:  FiservFair  Fund of Knowledge:  Poor  Language:  Poverty of content  Akathisia:  Negative  Handed:  Right  AIMS (if indicated):     Assets:  Physical Health Resilience  ADL's:  Intact  Cognition:  WNL  Sleep:  Number of Hours: 4.5     Treatment Plan Summary: Daily contact with patient to assess and evaluate symptoms and progress in treatment, Medication management and Plan 4 alcohol abuse versus dependence continue CIWA scale and as needed lorazepamFor psychosis escalate Risperdal4 threats of self-harm while intoxicated continue rehab and cognitive-based therapy no change in precautions  Cyrah Mclamb, MD 08/30/2019, 7:42 AM

## 2019-08-30 NOTE — BHH Group Notes (Signed)
LCSW Aftercare Discharge Planning Group Note   08/30/2019 100pm  Type of Group and Topic: Psychoeducational Group:  Discharge Planning  Participation Level:  Did Not Attend  Description of Group  Discharge planning group reviews patient's anticipated discharge plans and assists patients to anticipate and address any barriers to wellness/recovery in the community.  Suicide prevention education is reviewed with patients in group.  Therapeutic Goals 1. Patients will state their anticipated discharge plan and mental health aftercare 2. Patients will identify potential barriers to wellness in the community setting 3. Patients will engage in problem solving, solution focused discussion of ways to anticipate and address barriers to wellness/recovery  Summary of Patient Progress   Plan for Discharge/Comments:    Transportation Means:   Supports:  Therapeutic Modalities: Motivational Interviewing    Joanne Chars, [place] 08/30/2019 2:25 PM

## 2019-08-30 NOTE — Progress Notes (Signed)
D: Patient said that she thinks there is something in her ear that is talking. No tremors but vomited during the night. Denies SI and HI. Restless. Up during the night. Complaining of being cold. Temp 99.3. A: Continue to monitor for safety R: Safety maintained.

## 2019-08-30 NOTE — BHH Counselor (Signed)
CSW attempted to complete PSA with pt.  Pt sleepy, CSW asked her to come to office and returned to prompt her but pt states she is too tired right now. Winferd Humphrey, MSW, LCSW Advanced Care Supervisor 08/30/2019 2:00 PM

## 2019-08-30 NOTE — Plan of Care (Signed)
Patient oriented to unit. Patient's safety is maintained on unit. Patient denies SI/HI/AVH.  Patient isolates to room for most of day. Patient is adherent with scheduled medications.    Problem: Education: Goal: Knowledge of [place] [facility] Education information/materials will improve Outcome: Not Progressing Goal: Emotional status will improve Outcome: Not Progressing Goal: Mental status will improve Outcome: Not Progressing

## 2019-08-30 NOTE — Tx Team (Signed)
Interdisciplinary Treatment and Diagnostic Plan Update  08/30/2019 Time of Session: 7782 [name] MRN: [id number]  Principal Diagnosis: Schizophrenia [facility])  Secondary Diagnoses: Principal Problem:   Schizophrenia ([place]) Active Problems:   Alcohol abuse   Current Medications:  Current Facility-Administered Medications  Medication Dose Route Frequency Provider Last Rate Last Dose  . alum & mag hydroxide-simeth (MAALOX/MYLANTA) 200-200-20 MG/5ML suspension 30 mL  30 mL Oral Q4H PRN Cristofano, Paul A, MD      . hydrOXYzine (ATARAX/VISTARIL) tablet 25 mg  25 mg Oral Q6H PRN Clapacs, Madie Reno, MD   25 mg at 08/30/19 0037  . loperamide (IMODIUM) capsule 2-4 mg  2-4 mg Oral PRN Clapacs, John T, MD      . LORazepam (ATIVAN) tablet 1 mg  1 mg Oral Q6H PRN Clapacs, Madie Reno, MD   1 mg at 08/29/19 2109  . magnesium hydroxide (MILK OF MAGNESIA) suspension 30 mL  30 mL Oral Daily PRN Cristofano, Dorene Ar, MD      . multivitamin with minerals tablet 1 tablet  1 tablet Oral Daily Clapacs, Madie Reno, MD   1 tablet at 08/29/19 2109  . nicotine (NICODERM CQ - dosed in mg/24 hours) patch 21 mg  21 mg Transdermal Daily Clapacs, Madie Reno, MD   21 mg at 08/30/19 0823  . ondansetron (ZOFRAN-ODT) disintegrating tablet 4 mg  4 mg Oral Q6H PRN Clapacs, John T, MD      . risperiDONE (RISPERDAL) tablet 2 mg  2 mg Oral BID Johnn Hai, MD   2 mg at 08/30/19 3154  . thiamine (VITAMIN B-1) tablet 100 mg  100 mg Oral Daily Clapacs, Madie Reno, MD   100 mg at 08/29/19 2110   PTA Medications: Medications Prior to Admission  Medication Sig Dispense Refill Last Dose  . risperiDONE (RISPERDAL) 1 MG tablet Take 1 mg by mouth 2 (two) times daily.     . traZODone (DESYREL) 150 MG tablet Take 150 mg by mouth at bedtime.       Patient Stressors: Medication change or noncompliance Substance abuse Other: suicidal thoughts, homicidal thoughts, psychosis  Patient Strengths: Ability for insight Supportive  family/friends  Treatment Modalities: Medication Management, Group therapy, Case management,  1 to 1 session with clinician, Psychoeducation, Recreational therapy.   Physician Treatment Plan for Primary Diagnosis: Schizophrenia ([place]) Long Term Goal(s): Improvement in symptoms so as ready for discharge Improvement in symptoms so as ready for discharge   Short Term Goals: Ability to identify changes in lifestyle to reduce recurrence of condition will improve Ability to verbalize feelings will improve Ability to disclose and discuss suicidal ideas Compliance with prescribed medications will improve Ability to identify triggers associated with substance abuse/mental health issues will improve  Medication Management: Evaluate patient's response, side effects, and tolerance of medication regimen.  Therapeutic Interventions: 1 to 1 sessions, Unit Group sessions and Medication administration.  Evaluation of Outcomes: Not Met  Physician Treatment Plan for Secondary Diagnosis: Principal Problem:   Schizophrenia ([place]) Active Problems:   Alcohol abuse  Long Term Goal(s): Improvement in symptoms so as ready for discharge Improvement in symptoms so as ready for discharge   Short Term Goals: Ability to identify changes in lifestyle to reduce recurrence of condition will improve Ability to verbalize feelings will improve Ability to disclose and discuss suicidal ideas Compliance with prescribed medications will improve Ability to identify triggers associated with substance abuse/mental health issues will improve     Medication Management: Evaluate patient's response, side effects, and  tolerance of medication regimen.  Therapeutic Interventions: 1 to 1 sessions, Unit Group sessions and Medication administration.  Evaluation of Outcomes: Not Met   RN Treatment Plan for Primary Diagnosis: Schizophrenia ([place]) Long Term Goal(s): Knowledge of disease and therapeutic regimen to maintain health  will improve  Short Term Goals: Ability to identify and develop effective coping behaviors will improve and Compliance with prescribed medications will improve  Medication Management: RN will administer medications as ordered by provider, will assess and evaluate patient's response and provide education to patient for prescribed medication. RN will report any adverse and/or side effects to prescribing provider.  Therapeutic Interventions: 1 on 1 counseling sessions, Psychoeducation, Medication administration, Evaluate responses to treatment, Monitor vital signs and CBGs as ordered, Perform/monitor CIWA, COWS, AIMS and Fall Risk screenings as ordered, Perform wound care treatments as ordered.  Evaluation of Outcomes: Not Met   LCSW Treatment Plan for Primary Diagnosis: Schizophrenia ([place]) Long Term Goal(s): Safe transition to appropriate next level of care at discharge, Engage patient in therapeutic group addressing interpersonal concerns.  Short Term Goals: Engage patient in aftercare planning with referrals and resources, Increase social support and Increase skills for wellness and recovery  Therapeutic Interventions: Assess for all discharge needs, 1 to 1 time with Social worker, Explore available resources and support systems, Assess for adequacy in community support network, Educate family and significant other(s) on suicide prevention, Complete Psychosocial Assessment, Interpersonal group therapy.  Evaluation of Outcomes: Not Met   Progress in Treatment: Attending groups: No. Participating in groups: No. Taking medication as prescribed: Yes. Toleration medication: Yes. Family/Significant other contact made: No, will contact:  when given permission Patient understands diagnosis: Yes. Discussing patient identified problems/goals with staff: Yes. Medical problems stabilized or resolved: Yes. Denies suicidal/homicidal ideation: Yes. Issues/concerns per patient self-inventory:  No. Other: none  New problem(s) identified: No, Describe:  none  New Short Term/Long Term Goal(s):  Patient Goals:  "get myself together"  Discharge Plan or Barriers:   Reason for Continuation of Hospitalization: Depression Medication stabilization  Estimated Length of Stay: 3-5 days.  Attendees: Patient:[name] 08/30/2019 9:14 AM  Physician: Dr Jake Samples, MD 08/30/2019 9:14 AM  Nursing:  08/30/2019 9:14 AM  [place], RN 08/30/2019 9:14 AM  Social Worker: Lurline Idol, LCSW 08/30/2019 9:14 AM  Recreational Therapist:  08/30/2019 9:14 AM  Other:  08/30/2019 9:14 AM  Other:  08/30/2019 9:14 AM  Other: 08/30/2019 9:14 AM       Scribe for Treatment Team: Joanne Chars, LCSW 08/30/2019 9:21 AM

## 2019-08-31 NOTE — Progress Notes (Signed)
D: Patient has been disorganized. Denies SI, HI and AVH. Said she felt like hear stomach was tearing open and she was bleeding from both ends. No blood visualized. Patient went to the dayroom and sat down to socializing with peers after reporting this. Given Ativan per prn order. No further complaints A: continue to monitor for safety R: safety maintained.

## 2019-08-31 NOTE — BHH Group Notes (Signed)
LCSW Group Therapy Note 08/31/2019 1:15pm  Type of Therapy and Topic: Group Therapy: Feelings Around Returning Home & Establishing a Supportive Framework and Supporting Oneself When Supports Not Available  Participation Level: Did Not Attend  Description of Group:  Patients first processed thoughts and feelings about upcoming discharge. These included fears of upcoming changes, lack of change, new living environments, judgements and expectations from others and overall stigma of mental health issues. The group then discussed the definition of a supportive framework, what that looks and feels like, and how do to discern it from an unhealthy non-supportive network. The group identified different types of supports as well as what to do when your family/friends are less than helpful or unavailable  Therapeutic Goals  1. Patient will identify one healthy supportive network that they can use at discharge. 2. Patient will identify one factor of a supportive framework and how to tell it from an unhealthy network. 3. Patient able to identify one coping skill to use when they do not have positive supports from others. 4. Patient will demonstrate ability to communicate their needs through discussion and/or role plays.  Summary of Patient Progress:  Pt was invited to attend group but chose not to attend. CSW will continue to encourage pt to attend group throughout their admission.   Therapeutic Modalities Cognitive Behavioral Therapy Motivational Interviewing   Cheree Ditto, LCSW 08/31/2019 12:31 PM
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
## 2019-08-31 NOTE — Progress Notes (Signed)
This Probation officer was just informed that patient said to security that she is going to break something because she has to get out of here.

## 2019-08-31 NOTE — Plan of Care (Signed)
D- Patient alert and oriented. Patient presents in a pleasant mood on assessment stating that she slept alright last night and the only complaint that she had was of lower back pain. Patient rated her back pain an "8/10", however, she did not request any pain medication from this Probation officer. The MD was made aware and patient spoke to MD about this. Patient denies SI, HI, AVH, at this time. Patient also denies any signs/symptoms of depression/anxiety, stating "I feel good, just my back". Patient had no stated goals for today.  A- Scheduled medications administered to patient, per MD orders. Support and encouragement provided.  Routine safety checks conducted every 15 minutes.  Patient informed to notify staff with problems or concerns.  R- No adverse drug reactions noted. Patient contracts for safety at this time. Patient compliant with medications and treatment plan. Patient receptive, calm, and cooperative. Patient interacts well with others on the unit.  Patient remains safe at this time.  Problem: Education: Goal: Knowledge of [place] [facility] Education information/materials will improve Outcome: Progressing Goal: Emotional status will improve Outcome: Progressing Goal: Mental status will improve Outcome: Progressing   Problem: Health Behavior/Discharge Planning: Goal: Compliance with treatment plan for underlying cause of condition will improve Outcome: Progressing   Problem: Safety: Goal: Periods of time without injury will increase Outcome: Progressing   Problem: Education: Goal: Will be free of psychotic symptoms Outcome: Progressing   Problem: Health Behavior/Discharge Planning: Goal: Ability to remain free from injury will improve Outcome: Progressing

## 2019-08-31 NOTE — Progress Notes (Signed)
The ED just called the nurses station stating that patient has called the police.

## 2019-08-31 NOTE — Progress Notes (Signed)
Patient just came to the nurses station and stated that she has to get out of here and "I can not stay here another night in this creepy place". Patient also stated that people are doing this to her out of spite, "y'all need to call the doctor and do whatever y'all have to do because I can not stay here another night".

## 2019-08-31 NOTE — Progress Notes (Signed)
[facility] MD Progress Note  08/31/2019 9:27 AM [name]  MRN:  [id number] Subjective:    Ms. Berninger is a 41 she is known to have a history of alcohol dependency versus abuse as well as a schizophrenic type disorder, she allegedly texted suicidal threats she continues to deny this  The patient remains alert oriented and cordial she seems to be improved, she denies current auditory or visual hallucinations today and tends to lobby for discharge.  Overall conversant and more engaged than yesterday.  Again denies suicidal thoughts homicidal thoughts, no involuntary movements.  Principal Problem: Schizophrenia ([place]) Diagnosis: Principal Problem:   Schizophrenia ([place]) Active Problems:   Alcohol abuse  Total Time spent with patient: 20 minutes  Past Psychiatric History: Prior similar presentations  Past Medical History:  Past Medical History:  Diagnosis Date  . Abdominal pain affecting pregnancy, antepartum 02/28/2016  . Anemia    LAST HGB 04-03-17 12.5  . Anxiety   . Depression   . Gallstones 2018  . Incisional ventral hernia w obstruction   . Obesity 10/07/2013   Last Assessment & Plan:  Obesity in pregnancy is associated with maternal and obstetric complications including increased risk of gestational DM, gestational HTN, preeclampsia, thromboembolism, failed induction of labor, Cesarean section, excessive gestational weight gain and post-partum weight retention. Obesity is also associated with fetal complications including prematurity, stillbirth, congen  . Post-operative state 07-09-2016  . SIDS (sudden infant death syndrome) 2013/10/07   Last Assessment & Plan:  Ms. Hernandez third son died at three months of age from [place]. I reviewed with Ms. Wedig that the cause of SIDS is unknown. I did review with her that placing infants to sleep on their backs without soft bedding and tobacco cessation are potentially modifiable risk factors to decrease her risk of SIDS.  Marland Kitchen Small bowel  obstruction ([place])   . Supervision of high risk pregnancy in third trimester 10/07/13   Last Assessment & Plan:  Ms. Guest is receiving her care at [facility]. She reports having an appointment tomorrow 12/3. I encouraged her to get a flu and TDAP vaccination. She is due for her CBC, RPR, third trimester HIV and glucola.   . Tobacco abuse 10/07/13   Last Assessment & Plan:  I discussed with Ms. Craton that the risks of smoking in pregnancy include spontaneous pregnancy loss, placental abruption, preterm premature rupture of membranes (PPROM), placenta previa, preterm labor and delivery, and low birth weight (LBW) as well as SIDS. I discussed with her that her tobacco use is a modifiable risk factor for risk of placental abruption and SIDS, b    Past Surgical History:  Procedure Laterality Date  . CESAREAN SECTION N/A 06/12/2016   Procedure: STAT CESAREAN SECTION WITH BILATERAL STERILIZATION;  Surgeon: Boykin Nearing, MD;  Location: [facility] ORS;  Service: Obstetrics;  Laterality: N/A;  Baby Girl @ 2246 Weight 5 lb 10 oz Apgars 8/9  . ENDOSCOPIC RETROGRADE CHOLANGIOPANCREATOGRAPHY (ERCP) WITH PROPOFOL N/A 06/06/2017   Procedure: ENDOSCOPIC RETROGRADE CHOLANGIOPANCREATOGRAPHY (ERCP) WITH PROPOFOL;  Surgeon: Lucilla Lame, MD;  Location: [facility] ENDOSCOPY;  Service: Endoscopy;  Laterality: N/A;  . ERCP N/A 10/30/2017   Procedure: ENDOSCOPIC RETROGRADE CHOLANGIOPANCREATOGRAPHY (ERCP) STENT REMOVAL;  Surgeon: Lucilla Lame, MD;  Location: [facility] ENDOSCOPY;  Service: Endoscopy;  Laterality: N/A;  . INCISIONAL HERNIA REPAIR N/A 05/07/2017   Procedure: LAPAROSCOPIC INCISIONAL HERNIA;  Surgeon: Olean Ree, MD;  Location: [facility] ORS;  Service: General;  Laterality: N/A;  . TUBAL LIGATION    .  VENTRAL HERNIA REPAIR N/A 06/19/2016   Procedure: HERNIA REPAIR VENTRAL ADULT;  Surgeon: Lattie Haw, MD;  Location: [facility] ORS;  Service: General;  Laterality: N/A;   Family History:  Family History  Problem  Relation Age of Onset  . Diabetes Mother   . Asthma Sister   . Hypertension Maternal Aunt   . Hypertension Maternal Uncle    Family Psychiatric  History: No new data shared Social History:  Social History   Substance and Sexual Activity  Alcohol Use Yes  . Alcohol/week: 7.0 standard drinks  . Types: 7 Cans of beer per week   Comment: 2 40oz/day     Social History   Substance and Sexual Activity  Drug Use Yes   Comment: "pills"    Social History   Socioeconomic History  . Marital status: Single    Spouse name: Not on file  . Number of children: Not on file  . Years of education: Not on file  . Highest education level: Not on file  Occupational History  . Not on file  Social Needs  . Financial resource strain: Not on file  . Food insecurity    Worry: Not on file    Inability: Not on file  . Transportation needs    Medical: Not on file    Non-medical: Not on file  Tobacco Use  . Smoking status: Current Every Day Smoker    Packs/day: 1.50    Years: 10.00    Pack years: 15.00    Types: Cigarettes  . Smokeless tobacco: Never Used  Substance and Sexual Activity  . Alcohol use: Yes    Alcohol/week: 7.0 standard drinks    Types: 7 Cans of beer per week    Comment: 2 40oz/day  . Drug use: Yes    Comment: "pills"  . Sexual activity: Never  Lifestyle  . Physical activity    Days per week: Not on file    Minutes per session: Not on file  . Stress: Not on file  Relationships  . Social Musician on phone: Not on file    Gets together: Not on file    Attends religious service: Not on file    Active member of club or organization: Not on file    Attends meetings of clubs or organizations: Not on file    Relationship status: Not on file  Other Topics Concern  . Not on file  Social History Narrative  . Not on file   Additional Social History:                         Sleep: Good  Appetite:  Good  Current Medications: Current  Facility-Administered Medications  Medication Dose Route Frequency Provider Last Rate Last Dose  . alum & mag hydroxide-simeth (MAALOX/MYLANTA) 200-200-20 MG/5ML suspension 30 mL  30 mL Oral Q4H PRN Cristofano, Paul A, MD      . hydrOXYzine (ATARAX/VISTARIL) tablet 25 mg  25 mg Oral Q6H PRN Clapacs, Jackquline Denmark, MD   25 mg at 08/30/19 0037  . loperamide (IMODIUM) capsule 2-4 mg  2-4 mg Oral PRN Clapacs, John T, MD      . LORazepam (ATIVAN) tablet 1 mg  1 mg Oral Q6H PRN Clapacs, Jackquline Denmark, MD   1 mg at 08/30/19 2203  . magnesium hydroxide (MILK OF MAGNESIA) suspension 30 mL  30 mL Oral Daily PRN Cristofano, Worthy Rancher, MD      .  multivitamin with minerals tablet 1 tablet  1 tablet Oral Daily Clapacs, Jackquline DenmarkJohn T, MD   1 tablet at 08/30/19 2142  . nicotine (NICODERM CQ - dosed in mg/24 hours) patch 21 mg  21 mg Transdermal Daily Clapacs, Jackquline DenmarkJohn T, MD   21 mg at 08/30/19 0823  . ondansetron (ZOFRAN-ODT) disintegrating tablet 4 mg  4 mg Oral Q6H PRN Clapacs, Jackquline DenmarkJohn T, MD   4 mg at 08/30/19 2204  . risperiDONE (RISPERDAL) tablet 2 mg  2 mg Oral BID Malvin JohnsFarah, Tamaka Sawin, MD   2 mg at 08/31/19 0900  . thiamine (VITAMIN B-1) tablet 100 mg  100 mg Oral Daily Clapacs, Jackquline DenmarkJohn T, MD   100 mg at 08/30/19 2141    Lab Results:  Results for orders placed or performed during the hospital encounter of 08/29/19 (from the past 48 hour(s))  Hepatic function panel     Status: None   Collection Time: 08/30/19  6:56 AM  Result Value Ref Range   Total Protein 7.5 6.5 - 8.1 g/dL   Albumin 3.5 3.5 - 5.0 g/dL   AST 27 15 - 41 U/L   ALT 25 0 - 44 U/L   Alkaline Phosphatase 56 38 - 126 U/L   Total Bilirubin 1.1 0.3 - 1.2 mg/dL   Bilirubin, Direct 0.2 0.0 - 0.2 mg/dL   Indirect Bilirubin 0.9 0.3 - 0.9 mg/dL    Comment: Performed at [facility], [address] [place], [place] [address]  Lipid panel     Status: Abnormal   Collection Time: 08/30/19  6:56 AM  Result Value Ref Range   Cholesterol 183 0 - 200 mg/dL   Triglycerides 66  <604<150 mg/dL   HDL 56 >54>40 mg/dL   Total CHOL/HDL Ratio 3.3 RATIO   VLDL 13 0 - 40 mg/dL   LDL Cholesterol 098114 (H) 0 - 99 mg/dL    Comment:        Total Cholesterol/HDL:CHD Risk Coronary Heart Disease Risk Table                     Men   Women  1/2 Average Risk   3.4   3.3  Average Risk       5.0   4.4  2 X Average Risk   9.6   7.1  3 X Average Risk  23.4   11.0        Use the calculated Patient Ratio above and the CHD Risk Table to determine the patient's CHD Risk.        ATP III CLASSIFICATION (LDL):  <100     mg/dL   Optimal  [id number]  mg/dL   Near or Above                    Optimal  130-159  mg/dL   Borderline  [id number]  mg/dL   High  >130>190     mg/dL   Very High Performed at [facility], [address] [place], [place] [address]   TSH     Status: None   Collection Time: 08/30/19  6:56 AM  Result Value Ref Range   TSH 4.153 0.350 - 4.500 uIU/mL    Comment: Performed by a 3rd Generation assay with a functional sensitivity of <=0.01 uIU/mL. Performed at [facility], [address] [place], [place] [address]     Blood Alcohol level:  Lab Results  Component Value Date   ETH 50 (H) 08/28/2019  ETH 187 (H) 08/22/2019    Metabolic Disorder Labs: No results found for: HGBA1C, MPG No results found for: PROLACTIN Lab Results  Component Value Date   CHOL 183 08/30/2019   TRIG 66 08/30/2019   HDL 56 08/30/2019   CHOLHDL 3.3 08/30/2019   VLDL 13 08/30/2019   LDLCALC 114 (H) 08/30/2019    Physical Findings: AIMS:  , ,  ,  ,    CIWA:  CIWA-Ar Total: 0 COWS:     Musculoskeletal: Strength & Muscle Tone: within normal limits Gait & Station: normal Patient leans: N/A  Psychiatric Specialty Exam: Physical Exam  ROS  Blood pressure (!) 153/88, pulse 99, temperature 98.6 F (37 C), temperature source Oral, resp. rate 18, height  (1.626 m), weight 122 kg, last menstrual period 08/21/2019, SpO2 99 %.Body mass index is 46.17 kg/m.  General  Appearance: Casual  Eye Contact:  Good  Speech:  Clear and Coherent  Volume:  Normal  Mood:  Euthymic  Affect:  Appropriate  Thought Process:  Coherent, Goal Directed and Descriptions of Associations: Circumstantial  Orientation:  Full (Time, Place, and Person)  Thought Content:  Rumination  Suicidal Thoughts:  No  Homicidal Thoughts:  No  Memory:  Immediate;   Fair Recent;   Fair Remote;   Fair  Judgement:  Fair  Insight:  Fair  Psychomotor Activity:  Normal  Concentration:  Concentration: Fair and Attention Span: Fair  Recall:  [employer] of Knowledge:  Fair  Language:  Fair  Akathisia:  Negative  Handed:  Right  AIMS (if indicated):     Assets:  Resilience Social Support  ADL's:  Intact  Cognition:  WNL  Sleep:  Number of Hours: 6.5     Treatment Plan Summary: Daily contact with patient to assess and evaluate symptoms and progress in treatment and Medication management  Continue current antipsychotic regimen continue reality based therapy no change in precautions basic warnings, standard risk-benefit side effects discussed patient is her these types of statements before  Gerard Cantara, MD 08/31/2019, 9:27 AM

## 2019-08-31 NOTE — BHH Suicide Risk Assessment (Signed)
[place] INPATIENT:  Family/Significant Other Suicide Prevention Education  Suicide Prevention Education:  Patient Refusal for Family/Significant Other Suicide Prevention Education: The patient [name] has refused to provide written consent for family/significant other to be provided Family/Significant Other Suicide Prevention Education during admission and/or prior to discharge.  Physician notified.  Maranda Marte  CUEBAS-COLON 08/31/2019, 4:19 PM

## 2019-08-31 NOTE — Plan of Care (Signed)
  Problem: Education: Goal: Knowledge of [name] [facility] Education information/materials will improve Outcome: Progressing Goal: Emotional status will improve Outcome: Progressing Goal: Mental status will improve Outcome: Progressing  D: Patient has been disorganized. Denies SI, HI and AVH. Said she felt like hear stomach was tearing open and she was bleeding from both ends. No blood visualized. Patient went to the dayroom and sat down to socializing with peers after reporting this. Given Ativan per prn order. No further complaints A: continue to monitor for safety R: safety maintained.

## 2019-09-01 DIAGNOSIS — F1010 Alcohol abuse, uncomplicated: Secondary | ICD-10-CM

## 2019-09-01 LAB — PROLACTIN: Prolactin: 69.2 ng/mL — ABNORMAL HIGH (ref 4.8–23.3)

## 2019-09-01 MED ORDER — TRAZODONE HCL 50 MG PO TABS
50.0000 mg | ORAL_TABLET | Freq: Every evening | ORAL | Status: DC | PRN
  Administered 2019-09-01 – 2019-09-03 (×3): 50 mg via ORAL
  Filled 2019-09-01 (×3): qty 1

## 2019-09-01 NOTE — Plan of Care (Signed)
D- Patient alert and oriented. Patient presented in a pleasant mood on assessment stating that she slept ok last night and had no complaints to voice to this Probation officer. Patient denied SI, HI, AVH, and pain at this time. Patient also denied any signs/symptoms of depression and anxiety. Patient's goal for today is "my life, my home, my kids, our health and appearance", in which she will "do what I said I was going to do", in order to achieve her goal.  A- Scheduled medications administered to patient, per MD orders. Support and encouragement provided.  Routine safety checks conducted every 15 minutes.  Patient informed to notify staff with problems or concerns.  R- No adverse drug reactions noted. Patient contracts for safety at this time. Patient compliant with medications and treatment plan. Patient receptive, calm, and cooperative. Patient interacts well with others on the unit.  Patient remains safe at this time.  Problem: Education: Goal: Knowledge of [place] [facility] Education information/materials will improve Outcome: Progressing Goal: Emotional status will improve Outcome: Progressing Goal: Mental status will improve Outcome: Progressing   Problem: Health Behavior/Discharge Planning: Goal: Compliance with treatment plan for underlying cause of condition will improve Outcome: Progressing   Problem: Safety: Goal: Periods of time without injury will increase Outcome: Progressing   Problem: Education: Goal: Will be free of psychotic symptoms Outcome: Progressing   Problem: Health Behavior/Discharge Planning: Goal: Ability to remain free from injury will improve Outcome: Progressing

## 2019-09-01 NOTE — BHH Group Notes (Signed)

## 2019-09-01 NOTE — Progress Notes (Signed)
Recreation Therapy Notes  INPATIENT RECREATION THERAPY ASSESSMENT  Patient Details Name: EVVIE BEHRMANN MRN: [id number] DOB: October 27, 1985 Today's Date: 09/01/2019       Information Obtained From: Patient  Able to Participate in Assessment/Interview: Yes  Patient Presentation: Responsive  Reason for Admission (Per Patient): Active Symptoms  Patient Stressors:    Coping Skills:   Exercise, Other (Comment)(Smoke, just stay calm)  Leisure Interests (2+):  Individual - TV, Music - Listen  Frequency of Recreation/Participation:    Awareness of Community Resources:     [employer]:     Current Use:    If no, Barriers?:    Expressed Interest in [place] of Residence:  Insurance underwriter  Patient Main Form of Transportation: Musician  Patient Strengths:  Easy to talk to, helpful  Patient Identified Areas of Improvement:  Make some new friends  Patient Goal for Hospitalization:  To get myself  together  Current SI (including self-harm):  No  Current HI:  No  Current AVH: No  Staff Intervention Plan: Collaborate with Interdisciplinary Treatment Team, Group Attendance  Consent to Intern Participation: N/A  Willie Plain 09/01/2019, 2:20 PM

## 2019-09-01 NOTE — Progress Notes (Signed)
Recreation Therapy Notes  Date: 09/01/2019  Time: 9:30 am   Location: Craft room   Behavioral response: N/A   Intervention Topic: Anger Management    Discussion/Intervention: Patient did not attend group.   Clinical Observations/Feedback:  Patient did not attend group.   Taejah Ohalloran LRT/CTRS        Iori Gigante 09/01/2019 11:28 AM

## 2019-09-01 NOTE — Progress Notes (Signed)
D: Patient has been up and down most of the night. Appears paranoid. Believes someone is after a check she is about to get. Says the same thing happens every year. Denies SI, HI and AVH. A: Continue to monitor for safety R: Safety maintained.

## 2019-09-01 NOTE — Plan of Care (Signed)
  Problem: Education: Goal: Knowledge of [facility] Education information/materials will improve Outcome: Not Progressing Goal: Emotional status will improve Outcome: Not Progressing Goal: Mental status will improve Outcome: Not Progressing  D: Patient has been up and down most of the night. Appears paranoid. Believes someone is after a check she is about to get. Says the same thing happens every year. Denies SI, HI and AVH. A: Continue to monitor for safety R: Safety maintained.

## 2019-09-01 NOTE — Progress Notes (Signed)
[facility] MD Progress Note  09/01/2019 1:45 PM [name]  MRN:  [id number] Subjective: Patient is a 33 year old female with a previous diagnosis of schizophrenia and alcohol abuse.  She was admitted on 08/29/2019 after she had texted suicidal threats to her sister and her children.  The patient stated on admission she was "I was whooping and hollering when I was drunk".  She admitted to drinking heavily every day.  Objective: Patient is seen and examined.  Patient is a 33 year old female with the above-stated past psychiatric history who is seen in follow-up.  She denied complaint today.  She denied any suicidal or homicidal ideation.  On admission her blood alcohol was approximately 50.  Review of her laboratories on admission showed a low potassium at 3.0, elevated glucose at 123.  Her liver function enzymes were normal.  Her CBC was essentially normal including her MCV.  Drug screen was negative.  Her vital signs are stable, she is afebrile.  Her CIWA was 0 this AM.  It states in the nursing notes that she only slept 0.25 hours last night.  Her current medications include lorazepam 1 mg p.o. every 6 hours as needed a CIWA greater than 10 and Risperdal 2 mg p.o. twice daily.  Principal Problem: Schizophrenia ([place]) Diagnosis: Principal Problem:   Schizophrenia ([place]) Active Problems:   Alcohol abuse  Total Time spent with patient: 20 minutes  Past Psychiatric History: See admission H&P  Past Medical History:  Past Medical History:  Diagnosis Date  . Abdominal pain affecting pregnancy, antepartum 02/28/2016  . Anemia    LAST HGB 04-03-17 12.5  . Anxiety   . Depression   . Gallstones 2018  . Incisional ventral hernia w obstruction   . Obesity Sep 25, 2013   Last Assessment & Plan:  Obesity in pregnancy is associated with maternal and obstetric complications including increased risk of gestational DM, gestational HTN, preeclampsia, thromboembolism, failed induction of labor, Cesarean section,  excessive gestational weight gain and post-partum weight retention. Obesity is also associated with fetal complications including prematurity, stillbirth, congen  . Post-operative state 2016/06/27  . SIDS (sudden infant death syndrome) 09/25/13   Last Assessment & Plan:  Ms. Bacigalupo third son died at three months of age from [place]. I reviewed with Ms. Janvrin that the cause of SIDS is unknown. I did review with her that placing infants to sleep on their backs without soft bedding and tobacco cessation are potentially modifiable risk factors to decrease her risk of SIDS.  Marland Kitchen Small bowel obstruction ([place])   . Supervision of high risk pregnancy in third trimester September 25, 2013   Last Assessment & Plan:  Ms. Valek is receiving her care at [facility]. She reports having an appointment tomorrow 12/3. I encouraged her to get a flu and TDAP vaccination. She is due for her CBC, RPR, third trimester HIV and glucola.   . Tobacco abuse 09-25-13   Last Assessment & Plan:  I discussed with Ms. Mangal that the risks of smoking in pregnancy include spontaneous pregnancy loss, placental abruption, preterm premature rupture of membranes (PPROM), placenta previa, preterm labor and delivery, and low birth weight (LBW) as well as SIDS. I discussed with her that her tobacco use is a modifiable risk factor for risk of placental abruption and SIDS, b    Past Surgical History:  Procedure Laterality Date  . CESAREAN SECTION N/A 06/12/2016   Procedure: STAT CESAREAN SECTION WITH BILATERAL STERILIZATION;  Surgeon: Boykin Nearing, MD;  Location: [facility] ORS;  Service: Obstetrics;  Laterality: N/A;  Baby Girl @ 2246 Weight 5 lb 10 oz Apgars 8/9  . ENDOSCOPIC RETROGRADE CHOLANGIOPANCREATOGRAPHY (ERCP) WITH PROPOFOL N/A 06/06/2017   Procedure: ENDOSCOPIC RETROGRADE CHOLANGIOPANCREATOGRAPHY (ERCP) WITH PROPOFOL;  Surgeon: Midge MiniumWohl, Darren, MD;  Location: [facility] ENDOSCOPY;  Service: Endoscopy;  Laterality: N/A;  . ERCP N/A 10/30/2017    Procedure: ENDOSCOPIC RETROGRADE CHOLANGIOPANCREATOGRAPHY (ERCP) STENT REMOVAL;  Surgeon: Midge MiniumWohl, Darren, MD;  Location: [facility] ENDOSCOPY;  Service: Endoscopy;  Laterality: N/A;  . INCISIONAL HERNIA REPAIR N/A 05/07/2017   Procedure: LAPAROSCOPIC INCISIONAL HERNIA;  Surgeon: Henrene DodgePiscoya, Jose, MD;  Location: [facility] ORS;  Service: General;  Laterality: N/A;  . TUBAL LIGATION    . VENTRAL HERNIA REPAIR N/A 06/19/2016   Procedure: HERNIA REPAIR VENTRAL ADULT;  Surgeon: Lattie Hawichard E Cooper, MD;  Location: [facility] ORS;  Service: General;  Laterality: N/A;   Family History:  Family History  Problem Relation Age of Onset  . Diabetes Mother   . Asthma Sister   . Hypertension Maternal Aunt   . Hypertension Maternal Uncle    Family Psychiatric  History: See admission H&P Social History:  Social History   Substance and Sexual Activity  Alcohol Use Yes  . Alcohol/week: 7.0 standard drinks  . Types: 7 Cans of beer per week   Comment: 2 40oz/day     Social History   Substance and Sexual Activity  Drug Use Yes   Comment: "pills"    Social History   Socioeconomic History  . Marital status: Single    Spouse name: Not on file  . Number of children: Not on file  . Years of education: Not on file  . Highest education level: Not on file  Occupational History  . Not on file  Social Needs  . Financial resource strain: Not on file  . Food insecurity    Worry: Not on file    Inability: Not on file  . Transportation needs    Medical: Not on file    Non-medical: Not on file  Tobacco Use  . Smoking status: Current Every Day Smoker    Packs/day: 1.50    Years: 10.00    Pack years: 15.00    Types: Cigarettes  . Smokeless tobacco: Never Used  Substance and Sexual Activity  . Alcohol use: Yes    Alcohol/week: 7.0 standard drinks    Types: 7 Cans of beer per week    Comment: 2 40oz/day  . Drug use: Yes    Comment: "pills"  . Sexual activity: Never  Lifestyle  . Physical activity    Days per week:  Not on file    Minutes per session: Not on file  . Stress: Not on file  Relationships  . Social Musicianconnections    Talks on phone: Not on file    Gets together: Not on file    Attends religious service: Not on file    Active member of club or organization: Not on file    Attends meetings of clubs or organizations: Not on file    Relationship status: Not on file  Other Topics Concern  . Not on file  Social History Narrative  . Not on file   Additional Social History:                         Sleep: Poor  Appetite:  Fair  Current Medications: Current Facility-Administered Medications  Medication Dose Route Frequency Provider Last Rate Last Dose  . alum & mag  hydroxide-simeth (MAALOX/MYLANTA) 200-200-20 MG/5ML suspension 30 mL  30 mL Oral Q4H PRN Cristofano, Paul A, MD      . hydrOXYzine (ATARAX/VISTARIL) tablet 25 mg  25 mg Oral Q6H PRN Clapacs, Jackquline Denmark, MD   25 mg at 09/01/19 0147  . loperamide (IMODIUM) capsule 2-4 mg  2-4 mg Oral PRN Clapacs, John T, MD      . LORazepam (ATIVAN) tablet 1 mg  1 mg Oral Q6H PRN Clapacs, Jackquline Denmark, MD   1 mg at 08/31/19 2019  . magnesium hydroxide (MILK OF MAGNESIA) suspension 30 mL  30 mL Oral Daily PRN Cristofano, Worthy Rancher, MD      . multivitamin with minerals tablet 1 tablet  1 tablet Oral Daily Clapacs, John T, MD   1 tablet at 08/31/19 2019  . nicotine (NICODERM CQ - dosed in mg/24 hours) patch 21 mg  21 mg Transdermal Daily Clapacs, Jackquline Denmark, MD   21 mg at 09/01/19 1028  . ondansetron (ZOFRAN-ODT) disintegrating tablet 4 mg  4 mg Oral Q6H PRN Clapacs, Jackquline Denmark, MD   4 mg at 08/30/19 2204  . risperiDONE (RISPERDAL) tablet 2 mg  2 mg Oral BID Malvin Johns, MD   2 mg at 09/01/19 1028  . thiamine (VITAMIN B-1) tablet 100 mg  100 mg Oral Daily Clapacs, John T, MD   100 mg at 08/31/19 2018    Lab Results: No results found for this or any previous visit (from the past 48 hour(s)).  Blood Alcohol level:  Lab Results  Component Value Date   ETH 50  (H) 08/28/2019   ETH 187 (H) 08/22/2019    Metabolic Disorder Labs: No results found for: HGBA1C, MPG Lab Results  Component Value Date   PROLACTIN 69.2 (H) 08/30/2019   Lab Results  Component Value Date   CHOL 183 08/30/2019   TRIG 66 08/30/2019   HDL 56 08/30/2019   CHOLHDL 3.3 08/30/2019   VLDL 13 08/30/2019   LDLCALC 114 (H) 08/30/2019    Physical Findings: AIMS:  , ,  ,  ,    CIWA:  CIWA-Ar Total: 0 COWS:     Musculoskeletal: Strength & Muscle Tone: within normal limits Gait & Station: normal Patient leans: N/A  Psychiatric Specialty Exam: Physical Exam  Nursing note and vitals reviewed. Constitutional: She is oriented to person, place, and time. She appears well-developed and well-nourished.  HENT:  Head: Normocephalic and atraumatic.  Respiratory: Effort normal.  Neurological: She is alert and oriented to person, place, and time.    ROS  Blood pressure 134/90, pulse 88, temperature 98.6 F (37 C), temperature source Oral, resp. rate 18, height 5\' 4"  (1.626 m), weight 122 kg, last menstrual period 08/21/2019, SpO2 100 %.Body mass index is 46.17 kg/m.  General Appearance: Casual  Eye Contact:  Fair  Speech:  Normal Rate  Volume:  Normal  Mood:  Anxious  Affect:  Congruent  Thought Process:  Coherent and Descriptions of Associations: Circumstantial  Orientation:  Full (Time, Place, and Person)  Thought Content:  Logical  Suicidal Thoughts:  No  Homicidal Thoughts:  No  Memory:  Immediate;   Fair Recent;   Fair Remote;   Fair  Judgement:  Intact  Insight:  Lacking  Psychomotor Activity:  Normal  Concentration:  Concentration: Fair and Attention Span: Fair  Recall:  13/09/2019 of Knowledge:  Fair  Language:  Fair  Akathisia:  Negative  Handed:  Right  AIMS (if indicated):  Assets:  Desire for Improvement Resilience  ADL's:  Intact  Cognition:  WNL  Sleep:  Number of Hours: 0.25     Treatment Plan Summary: Daily contact with patient to  assess and evaluate symptoms and progress in treatment, Medication management and Plan Patient is seen and examined.  Patient is a 33 year old female with the above-stated past psychiatric history seen in follow-up.   Diagnosis: #1 schizophrenia, #2 alcohol use disorder  Patient is seen in follow-up.  Except for the lack of sleep she seems to be doing a bit better.  She does get irritable when she talks about her relatives.  No change in her medications currently, and we will continue to monitor for withdrawal.  She does have trazodone available for her for sleep if she has difficulty. 1.  Continue lorazepam 1 mg p.o. every 6 hours as needed a CIWA greater than 10. 2.  Continue Risperdal 2 mg p.o. twice daily for mood stability and psychosis. 3.  Add trazodone 100 mg p.o. nightly as needed insomnia. 4.  Continue thiamine 100 mg p.o. daily for nutritional supplementation. 5.  Disposition planning-in progress.  Antonieta Pert, MD 09/01/2019, 1:45 PM

## 2019-09-02 MED ORDER — RISPERIDONE 1 MG PO TABS
3.0000 mg | ORAL_TABLET | Freq: Two times a day (BID) | ORAL | Status: DC
  Administered 2019-09-02 – 2019-09-04 (×4): 3 mg via ORAL
  Filled 2019-09-02 (×4): qty 3

## 2019-09-02 NOTE — Progress Notes (Signed)
Patient is alert and oriented x 4, she denies SI/HI but admits AH and was noted paranoid, restless and anxious, she denies  command hallucination. Patient was noted pacing walking back and forth on the unit she refused to sleep in her bed, she was medicated for anxiety and assisted back to bed. Patient was less anxious after a while and she ws noted in bed with eyes closed. 15 minutes safety checks maintained will continue to monitor.

## 2019-09-02 NOTE — Progress Notes (Signed)
Pt denies anxiety, depression, SI, HI and AVH. Pt was educated on care plan and verbalizes understanding. Collier Bullock RN

## 2019-09-02 NOTE — Progress Notes (Signed)
[facility]  09/02/2019 11:28 AM Coralee Northshley R Sloma  MRN:  [id number] Subjective:  Patient is a 33 year old female with a previous diagnosis of schizophrenia and alcohol abuse.  She was admitted on 08/29/2019 after she had texted suicidal threats to her sister and her children.  The patient stated on admission she was "I was whooping and hollering when I was drunk".  She admitted to drinking heavily every day.  Objective: Patient is seen and examined.  Patient is a 33 year old female with the above-stated past psychiatric history who is seen in follow-up.  Review of the nursing notes was done.  It shows that the patient admitted to auditory hallucinations last night, and was noted to be paranoid, restless and anxious.  She was pacing back and forth in the unit, but refused to go to bed.  As stated in yesterday's note she had only slept approximately 15 minutes the night before, but did sleep 5 hours last night.  Her most recent CIWA was 0.  She is not shown any signs or symptoms of withdrawal.  We discussed the auditory hallucinations and the paranoia.  She is upset over the fact that she is not going to be able to be discharged today.  She continues to contend that the involuntary commitment paperwork was "old" and she does not understand why it keeps being put in place to bring her to the hospital.  She is unhappy over the fact that she will have to stay in the hospital at least another day, and I have asked her to understand that the medication should help her and to calm down and be more appropriate as well as help her sleep.  She is not happy about that.  Her vital signs are stable, she has a low-grade temperature this morning and 99.2.  No new laboratories.  Principal Problem: Schizophrenia (HCC) Diagnosis: Principal Problem:   Schizophrenia (HCC) Active Problems:   Alcohol abuse  Total Time spent with patient: 15 minutes  Past Psychiatric History: See admission H&P  Past Medical  History:  Past Medical History:  Diagnosis Date  . Abdominal pain affecting pregnancy, antepartum 02/28/2016  . Anemia    LAST HGB 04-03-17 12.5  . Anxiety   . Depression   . Gallstones 2018  . Incisional ventral hernia w obstruction   . Obesity 10/07/2013   Last Assessment & Plan:  Obesity in pregnancy is associated with maternal and obstetric complications including increased risk of gestational DM, gestational HTN, preeclampsia, thromboembolism, failed induction of labor, Cesarean section, excessive gestational weight gain and post-partum weight retention. Obesity is also associated with fetal complications including prematurity, stillbirth, congen  . Post-operative state 06/13/2016  . SIDS (sudden infant death syndrome) 10/07/2013   Last Assessment & Plan:  [name]'s third son died at three months of age from SIDS. I reviewed with [name] that the cause of SIDS is unknown. I did review with her that placing infants to sleep on their backs without soft bedding and tobacco cessation are potentially modifiable risk factors to decrease her risk of SIDS.  Marland Kitchen. Small bowel obstruction (HCC)   . Supervision of high risk pregnancy in third trimester 10/07/2013   Last Assessment & Plan:  [name] [facility] She reports having an appointment tomorrow 12/3. I encouraged her to get a flu and TDAP vaccination. She is due for her CBC, RPR, third trimester HIV and glucola.   . Tobacco abuse 10/02/2013  Last Assessment & Plan:  I discussed with Ms. Germani that the risks of smoking in pregnancy include spontaneous pregnancy loss, placental abruption, preterm premature rupture of membranes (PPROM), placenta previa, preterm labor and delivery, and low birth weight (LBW) as well as SIDS. I discussed with her that her tobacco use is a modifiable risk factor for risk of placental abruption and SIDS, b    Past Surgical History:  Procedure Laterality Date  . CESAREAN SECTION N/A  06/12/2016   Procedure: STAT CESAREAN SECTION WITH BILATERAL STERILIZATION;  Surgeon: Boykin Nearing, MD;  Location: [facility] ORS;  Service: Obstetrics;  Laterality: N/A;  Baby Girl @ 2246 Weight 5 lb 10 oz Apgars 8/9  . ENDOSCOPIC RETROGRADE CHOLANGIOPANCREATOGRAPHY (ERCP) WITH PROPOFOL N/A 06/06/2017   Procedure: ENDOSCOPIC RETROGRADE CHOLANGIOPANCREATOGRAPHY (ERCP) WITH PROPOFOL;  Surgeon: Lucilla Lame, MD;  Location: [facility] ENDOSCOPY;  Service: Endoscopy;  Laterality: N/A;  . ERCP N/A 10/30/2017   Procedure: ENDOSCOPIC RETROGRADE CHOLANGIOPANCREATOGRAPHY (ERCP) STENT REMOVAL;  Surgeon: Lucilla Lame, MD;  Location: [facility] ENDOSCOPY;  Service: Endoscopy;  Laterality: N/A;  . INCISIONAL HERNIA REPAIR N/A 05/07/2017   Procedure: LAPAROSCOPIC INCISIONAL HERNIA;  Surgeon: Olean Ree, MD;  Location: [facility] ORS;  Service: General;  Laterality: N/A;  . TUBAL LIGATION    . VENTRAL HERNIA REPAIR N/A 06/19/2016   Procedure: HERNIA REPAIR VENTRAL ADULT;  Surgeon: Florene Glen, MD;  Location: [facility] ORS;  Service: General;  Laterality: N/A;   Family History:  Family History  Problem Relation Age of Onset  . Diabetes Mother   . Asthma Sister   . Hypertension Maternal Aunt   . Hypertension Maternal Uncle    Family Psychiatric  History: See admission H&P Social History:  Social History   Substance and Sexual Activity  Alcohol Use Yes  . Alcohol/week: 7.0 standard drinks  . Types: 7 Cans of beer per week   Comment: 2 40oz/day     Social History   Substance and Sexual Activity  Drug Use Yes   Comment: "pills"    Social History   Socioeconomic History  . Marital status: Single    Spouse name: Not on file  . Number of children: Not on file  . Years of education: Not on file  . Highest education level: Not on file  Occupational History  . Not on file  Social Needs  . Financial resource strain: Not on file  . Food insecurity    Worry: Not on file    Inability: Not on file  .  Transportation needs    Medical: Not on file    Non-medical: Not on file  Tobacco Use  . Smoking status: Current Every Day Smoker    Packs/day: 1.50    Years: 10.00    Pack years: 15.00    Types: Cigarettes  . Smokeless tobacco: Never Used  Substance and Sexual Activity  . Alcohol use: Yes    Alcohol/week: 7.0 standard drinks    Types: 7 Cans of beer per week    Comment: 2 40oz/day  . Drug use: Yes    Comment: "pills"  . Sexual activity: Never  Lifestyle  . Physical activity    Days per week: Not on file    Minutes per session: Not on file  . Stress: Not on file  Relationships  . Social Herbalist on phone: Not on file    Gets together: Not on file    Attends religious service: Not on file    Active member  of club or organization: Not on file    Attends meetings of clubs or organizations: Not on file    Relationship status: Not on file  Other Topics Concern  . Not on file  Social History Narrative  . Not on file   Additional Social History:                         Sleep: Fair  Appetite:  Good  Current Medications: Current Facility-Administered Medications  Medication Dose Route Frequency Provider Last Rate Last Dose  . alum & mag hydroxide-simeth (MAALOX/MYLANTA) 200-200-20 MG/5ML suspension 30 mL  30 mL Oral Q4H PRN Cristofano, Paul A, MD      . magnesium hydroxide (MILK OF MAGNESIA) suspension 30 mL  30 mL Oral Daily PRN Cristofano, Worthy Rancher, MD      . multivitamin with minerals tablet 1 tablet  1 tablet Oral Daily Clapacs, Jackquline Denmark, MD   1 tablet at 09/01/19 1801  . nicotine (NICODERM CQ - dosed in mg/24 hours) patch 21 mg  21 mg Transdermal Daily Clapacs, Jackquline Denmark, MD   21 mg at 09/02/19 0811  . risperiDONE (RISPERDAL) tablet 3 mg  3 mg Oral BID Antonieta Pert, MD      . thiamine (VITAMIN B-1) tablet 100 mg  100 mg Oral Daily Clapacs, Jackquline Denmark, MD   100 mg at 09/01/19 1801  . traZODone (DESYREL) tablet 50 mg  50 mg Oral QHS PRN Antonieta Pert, MD   50 mg at 09/01/19 2316    Lab Results: No results found for this or any previous visit (from the past 48 hour(s)).  Blood Alcohol level:  Lab Results  Component Value Date   ETH 50 (H) 08/28/2019   ETH 187 (H) 08/22/2019    Metabolic Disorder Labs: No results found for: HGBA1C, MPG Lab Results  Component Value Date   PROLACTIN 69.2 (H) 08/30/2019   Lab Results  Component Value Date   CHOL 183 08/30/2019   TRIG 66 08/30/2019   HDL 56 08/30/2019   CHOLHDL 3.3 08/30/2019   VLDL 13 08/30/2019   LDLCALC 114 (H) 08/30/2019    Physical Findings: AIMS:  , ,  ,  ,    CIWA:  CIWA-Ar Total: 0 COWS:     Musculoskeletal: Strength & Muscle Tone: within normal limits Gait & Station: normal Patient leans: N/A  Psychiatric Specialty Exam: Physical Exam  Nursing note and vitals reviewed. Constitutional: She is oriented to person, place, and time. She appears well-developed and well-nourished.  HENT:  Head: Normocephalic and atraumatic.  Respiratory: Effort normal.  Neurological: She is alert and oriented to person, place, and time.    ROS  Blood pressure 130/73, pulse (!) 101, temperature 99.2 F (37.3 C), temperature source Oral, resp. rate 18, height 5\' 4"  (1.626 m), weight 122 kg, last menstrual period 08/21/2019, SpO2 97 %.Body mass index is 46.17 kg/m.  General Appearance: Disheveled  Eye Contact:  Fair  Speech:  Normal Rate  Volume:  Normal  Mood:  Irritable  Affect:  Congruent  Thought Process:  Goal Directed and Descriptions of Associations: Loose  Orientation:  Full (Time, Place, and Person)  Thought Content:  Delusions and Hallucinations: Auditory  Suicidal Thoughts:  No  Homicidal Thoughts:  No  Memory:  Immediate;   Fair Recent;   Fair Remote;   Fair  Judgement:  Impaired  Insight:  Lacking  Psychomotor Activity:  Increased  Concentration:  Concentration:  Fair and Attention Span: Fair  Recall:  [employer] of Knowledge:  Fair  Language:   Fair  Akathisia:  Negative  Handed:  Right  AIMS (if indicated):     Assets:  Desire for Improvement Resilience  ADL's:  Intact  Cognition:  WNL  Sleep:  Number of Hours: 5     Treatment Plan Summary: Daily contact with patient to assess and evaluate symptoms and progress in treatment, Medication management and Plan : Patient is seen and examined.  Patient is a 33 year old female with the above-stated past psychiatric history who is seen in follow-up.   Diagnosis: #1 schizophrenia, #2 alcohol use disorder  Patient is seen in follow-up.  We discussed with the patient her activity last night, especially with regard to the paranoid thinking as well as the auditory hallucinations.  I have increased her Risperdal today to 3 mg p.o. twice daily for mood stability and psychosis.  No other changes in her medications.  Hopefully this will help with her psychosis and mood stability and she will calm down and sleep more appropriately.  She is not showing any signs or symptoms of alcohol withdrawal at least at this point. 1.  Continue lorazepam 1 mg p.o. every 6 hours as needed a CIWA greater than 10. 2.  Increase Risperdal to 3 mg p.o. twice daily for mood stability and psychosis. 3.  Continue trazodone 100 mg p.o. nightly as needed insomnia. 4.  Continue thiamine 100 mg p.o. daily for nutritional supplementation. 5.  Disposition planning-in progress.  Antonieta Pert, MD 09/02/2019, 11:28 AM

## 2019-09-02 NOTE — [facility] Group Notes (Signed)
LCSW Group Therapy Note  09/02/2019 1:00 PM  Type of Therapy/Topic:  Group Therapy:  Feelings about Diagnosis  Participation Level:  Did Not Attend   Description of Group:   This group will allow patients to explore their thoughts and feelings about diagnoses they have received. Patients will be guided to explore their level of understanding and acceptance of these diagnoses. Facilitator will encourage patients to process their thoughts and feelings about the reactions of others to their diagnosis and will guide patients in identifying ways to discuss their diagnosis with significant others in their lives. This group will be process-oriented, with patients participating in exploration of their own experiences, giving and receiving support, and processing challenge from other group members.   Therapeutic Goals: 1. Patient will demonstrate understanding of diagnosis as evidenced by identifying two or more symptoms of the disorder 2. Patient will be able to express two feelings regarding the diagnosis 3. Patient will demonstrate their ability to communicate their needs through discussion and/or role play  Summary of Patient Progress: X  Therapeutic Modalities:   Cognitive Behavioral Therapy Brief Therapy Feelings Identification   Shawnte Demarest, MSW, LCSW 09/02/2019 12:40 PM  

## 2019-09-02 NOTE — Progress Notes (Signed)
Recreation Therapy Notes  Date: 09/02/2019  Time: 9:30 am  Location: Craft room  Behavioral response: Appropriate   Intervention Topic: Communication     Discussion/Intervention:   Group content today was focused on communication. The group defined communication and ways to communicate with others. Individuals stated reason why communication is important and some reasons to communicate with others. Patients expressed if they thought they were good at communicating with others and ways they could improve their communication skills. The group identified important parts of communication and some experiences they have had in the past with communication. The group participated in the intervention "Words in a Bag", where they had a chance to test out their communication skills and identify ways to improve their communication techniques.  Clinical Observations/Feedback:  Patient came to group and stated that communication is based off accepting each other. She expressed that  communication is the key to life. Individual participated in the intervention and was social with peers and staff during group. Lerin Jech LRT/CTRS         Riane Rung 09/02/2019 12:17 PM

## 2019-09-03 DIAGNOSIS — F209 Schizophrenia, unspecified: Principal | ICD-10-CM

## 2019-09-03 NOTE — [facility] Suicide Risk Assessment (Signed)
[facility] INPATIENT:  Family/Significant Other Suicide Prevention Education  Suicide Prevention Education:  Contact Attempts: Yarah Fuente, sister, [phone] has been identified by the patient as the family member/significant other with whom the patient will be residing, and identified as the person(s) who will aid the patient in the event of a mental health crisis.  With written consent from the patient, two attempts were made to provide suicide prevention education, prior to and/or following the patient's discharge.  We were unsuccessful in providing suicide prevention education.  A suicide education pamphlet was given to the patient to share with family/significant other.  Date and time of first attempt: 09/03/2019 at 2:19PM Date and time of second attempt: Second attempt is needed.  Rozann Lesches 09/03/2019, 2:18 PM

## 2019-09-03 NOTE — Progress Notes (Signed)
Recreation Therapy Notes  Date: 09/03/2019  Time: 9:30 am   Location: Craft room   Behavioral response: N/A   Intervention Topic: Problem Solving   Discussion/Intervention: Patient did not attend group.   Clinical Observations/Feedback:  Patient did not attend group.   Cahlil Sattar LRT/CTRS         Pahoua Schreiner 09/03/2019 10:51 AM

## 2019-09-03 NOTE — Plan of Care (Signed)
D- Patient alert and oriented. Patient presented in a pleasant mood on assessment stating that she slept good last night and had no complaints to voice to this Probation officer. Patient denied SI, HI, AVH, and pain at this time. Patient also denied any signs/symptoms of depression and anxiety, stating that "I feel good". Patient's goal for today is to "start on my personal goa I set up for me and my kids".  A- Scheduled medications administered to patient, per MD orders. Support and encouragement provided.  Routine safety checks conducted every 15 minutes.  Patient informed to notify staff with problems or concerns.  R- No adverse drug reactions noted. Patient contracts for safety at this time. Patient compliant with medications and treatment plan. Patient receptive, calm, and cooperative. Patient interacts well with others on the unit.  Patient remains safe at this time.  Problem: Education: Goal: Knowledge of [facility] Education information/materials will improve Outcome: Progressing Goal: Emotional status will improve Outcome: Progressing Goal: Mental status will improve Outcome: Progressing   Problem: Health Behavior/Discharge Planning: Goal: Compliance with treatment plan for underlying cause of condition will improve Outcome: Progressing   Problem: Safety: Goal: Periods of time without injury will increase Outcome: Progressing   Problem: Education: Goal: Will be free of psychotic symptoms Outcome: Progressing   Problem: Health Behavior/Discharge Planning: Goal: Ability to remain free from injury will improve Outcome: Progressing

## 2019-09-03 NOTE — [facility] Group Notes (Signed)
Emotional Regulation 09/03/2019 1PM  Type of Therapy/Topic:  Group Therapy:  Emotion Regulation  Participation Level:  Did Not Attend   Description of Group:   The purpose of this group is to assist patients in learning to regulate negative emotions and experience positive emotions. Patients will be guided to discuss ways in which they have been vulnerable to their negative emotions. These vulnerabilities will be juxtaposed with experiences of positive emotions or situations, and patients will be challenged to use positive emotions to combat negative ones. Special emphasis will be placed on coping with negative emotions in conflict situations, and patients will process healthy conflict resolution skills.  Therapeutic Goals: 1. Patient will identify two positive emotions or experiences to reflect on in order to balance out negative emotions 2. Patient will label two or more emotions that they find the most difficult to experience 3. Patient will demonstrate positive conflict resolution skills through discussion and/or role plays  Summary of Patient Progress:       Therapeutic Modalities:   Cognitive Behavioral Therapy Feelings Identification Dialectical Behavioral Therapy   Yvette Rack, LCSW 09/03/2019 2:21 PM

## 2019-09-03 NOTE — Progress Notes (Signed)
Patient is alert and oriented x 4, she denies SI/HI but admits Auditory hallucination, " I am seeing things ; ghost in my room" she was noted paranoid, restless and anxious, she denies  command hallucination. Patient was pacing on the unit she refused to sleep in her bed, she was medicated for anxiety and assisted back to bed. Patient eventually after some emotional support was noted in bed with eyes closed. 15 minutes safety checks maintained will continue to monitor.

## 2019-09-03 NOTE — Progress Notes (Signed)
[facility]  09/03/2019 1:35 PM Coralee Northshley R Bartelson  MRN:  [id number] Subjective:  Patient is a 33 year old female with a previous diagnosis of schizophrenia and alcohol abuse. She was admitted on 08/29/2019 after she had texted suicidal threats to her sister and her children. The patient stated on admission she was "I was whooping and hollering when I was drunk". She admitted to drinking heavily every day.  Objective: Patient is seen and examined.  Patient is a 33 year old female with the above-stated past psychiatric history who is seen in follow-up.  Review of the nursing notes reflected that last night she continued to complain of auditory hallucinations.  Her Risperdal was increased to 3 mg p.o. twice daily yesterday.  This morning she is more pleasant and less paranoid and agitated.  She does request to be discharged home, but is much more appropriate about it.  She stated that she slept well last night, and she wanted to make sure that when she got out of the hospital that she could have that medicine to help her sleep.  We discussed that.  She denied any suicidal or homicidal ideation.  She denied any current visual hallucinations.  She stated that the auditory hallucinations come and go.  She does appear to be less paranoid this morning, and does not bring up the issue of the "old involuntary commitment".  Vital signs are stable, she is afebrile.  She slept 6 hours last night.  Her CIWA was 0 this morning.  No new laboratories.  Principal Problem: Schizophrenia (HCC) Diagnosis: Principal Problem:   Schizophrenia (HCC) Active Problems:   Alcohol abuse  Total Time spent with patient: 20 minutes  Past Psychiatric History: See admission H&P  Past Medical History:  Past Medical History:  Diagnosis Date  . Abdominal pain affecting pregnancy, antepartum 02/28/2016  . Anemia    LAST HGB 04-03-17 12.5  . Anxiety   . Depression   . Gallstones 2018  . Incisional ventral hernia w  obstruction   . Obesity 10/08/2013   Last Assessment & Plan:  Obesity in pregnancy is associated with maternal and obstetric complications including increased risk of gestational DM, gestational HTN, preeclampsia, thromboembolism, failed induction of labor, Cesarean section, excessive gestational weight gain and post-partum weight retention. Obesity is also associated with fetal complications including prematurity, stillbirth, congen  . Post-operative state 06/13/2016  . SIDS (sudden infant death syndrome) 09/21/2013   Last Assessment & Plan:  [name]'s third son died at three months of age from SIDS. I reviewed with [name] that the cause of SIDS is unknown. I did review with her that placing infants to sleep on their backs without soft bedding and tobacco cessation are potentially modifiable risk factors to decrease her risk of SIDS.  Marland Kitchen. Small bowel obstruction (HCC)   . Supervision of high risk pregnancy in third trimester 09/14/2013   Last Assessment & Plan:  [name] [facility] She reports having an appointment tomorrow 12/3. I encouraged her to get a flu and TDAP vaccination. She is due for her CBC, RPR, third trimester HIV and glucola.   . Tobacco abuse 10/04/2013   Last Assessment & Plan:  I discussed with [name] that the risks of smoking in pregnancy include spontaneous pregnancy loss, placental abruption, preterm premature rupture of membranes (PPROM), placenta previa, preterm labor and delivery, and low birth weight (LBW) as well as SIDS. I discussed with her that her tobacco use is a  modifiable risk factor for risk of placental abruption and SIDS, b    Past Surgical History:  Procedure Laterality Date  . CESAREAN SECTION N/A 06/12/2016   Procedure: STAT CESAREAN SECTION WITH BILATERAL STERILIZATION;  Surgeon: Suzy Bouchard, MD;  Location: [facility] ORS;  Service: Obstetrics;  Laterality: N/A;  Baby Girl @ 2246 Weight 5 lb 10 oz Apgars 8/9  .  ENDOSCOPIC RETROGRADE CHOLANGIOPANCREATOGRAPHY (ERCP) WITH PROPOFOL N/A 06/06/2017   Procedure: ENDOSCOPIC RETROGRADE CHOLANGIOPANCREATOGRAPHY (ERCP) WITH PROPOFOL;  Surgeon: Midge Minium, MD;  Location: [facility] ENDOSCOPY;  Service: Endoscopy;  Laterality: N/A;  . ERCP N/A 10/30/2017   Procedure: ENDOSCOPIC RETROGRADE CHOLANGIOPANCREATOGRAPHY (ERCP) STENT REMOVAL;  Surgeon: Midge Minium, MD;  Location: [facility] ENDOSCOPY;  Service: Endoscopy;  Laterality: N/A;  . INCISIONAL HERNIA REPAIR N/A 05/07/2017   Procedure: LAPAROSCOPIC INCISIONAL HERNIA;  Surgeon: Henrene Dodge, MD;  Location: [facility] ORS;  Service: General;  Laterality: N/A;  . TUBAL LIGATION    . VENTRAL HERNIA REPAIR N/A 06/19/2016   Procedure: HERNIA REPAIR VENTRAL ADULT;  Surgeon: Lattie Haw, MD;  Location: [facility] ORS;  Service: General;  Laterality: N/A;   Family History:  Family History  Problem Relation Age of Onset  . Diabetes Mother   . Asthma Sister   . Hypertension Maternal Aunt   . Hypertension Maternal Uncle    Family Psychiatric  History: See admission H&P Social History:  Social History   Substance and Sexual Activity  Alcohol Use Yes  . Alcohol/week: 7.0 standard drinks  . Types: 7 Cans of beer per week   Comment: 2 40oz/day     Social History   Substance and Sexual Activity  Drug Use Yes   Comment: "pills"    Social History   Socioeconomic History  . Marital status: Single    Spouse name: Not on file  . Number of children: Not on file  . Years of education: Not on file  . Highest education level: Not on file  Occupational History  . Not on file  Social Needs  . Financial resource strain: Not on file  . Food insecurity    Worry: Not on file    Inability: Not on file  . Transportation needs    Medical: Not on file    Non-medical: Not on file  Tobacco Use  . Smoking status: Current Every Day Smoker    Packs/day: 1.50    Years: 10.00    Pack years: 15.00    Types: Cigarettes  . Smokeless tobacco:  Never Used  Substance and Sexual Activity  . Alcohol use: Yes    Alcohol/week: 7.0 standard drinks    Types: 7 Cans of beer per week    Comment: 2 40oz/day  . Drug use: Yes    Comment: "pills"  . Sexual activity: Never  Lifestyle  . Physical activity    Days per week: Not on file    Minutes per session: Not on file  . Stress: Not on file  Relationships  . Social Musician on phone: Not on file    Gets together: Not on file    Attends religious service: Not on file    Active member of club or organization: Not on file    Attends meetings of clubs or organizations: Not on file    Relationship status: Not on file  Other Topics Concern  . Not on file  Social History Narrative  . Not on file   Additional Social History:  Sleep: Good  Appetite:  Good  Current Medications: Current Facility-Administered Medications  Medication Dose Route Frequency Provider Last Rate Last Dose  . alum & mag hydroxide-simeth (MAALOX/MYLANTA) 200-200-20 MG/5ML suspension 30 mL  30 mL Oral Q4H PRN Cristofano, Paul A, MD      . magnesium hydroxide (MILK OF MAGNESIA) suspension 30 mL  30 mL Oral Daily PRN Cristofano, Dorene Ar, MD   30 mL at 09/03/19 0807  . multivitamin with minerals tablet 1 tablet  1 tablet Oral Daily Clapacs, Madie Reno, MD   1 tablet at 09/02/19 2000  . nicotine (NICODERM CQ - dosed in mg/24 hours) patch 21 mg  21 mg Transdermal Daily Clapacs, Madie Reno, MD   21 mg at 09/03/19 0808  . risperiDONE (RISPERDAL) tablet 3 mg  3 mg Oral BID Sharma Covert, MD   3 mg at 09/03/19 1478  . thiamine (VITAMIN B-1) tablet 100 mg  100 mg Oral Daily Clapacs, Madie Reno, MD   100 mg at 09/02/19 2000  . traZODone (DESYREL) tablet 50 mg  50 mg Oral QHS PRN Sharma Covert, MD   50 mg at 09/02/19 2144    Lab Results: No results found for this or any previous visit (from the past 48 hour(s)).  Blood Alcohol level:  Lab Results  Component Value Date   ETH 50  (H) 08/28/2019   ETH 187 (H) 29/56/2130    Metabolic Disorder Labs: No results found for: HGBA1C, MPG Lab Results  Component Value Date   PROLACTIN 69.2 (H) 08/30/2019   Lab Results  Component Value Date   CHOL 183 08/30/2019   TRIG 66 08/30/2019   HDL 56 08/30/2019   CHOLHDL 3.3 08/30/2019   VLDL 13 08/30/2019   LDLCALC 114 (H) 08/30/2019    Physical Findings: AIMS:  , ,  ,  ,    CIWA:  CIWA-Ar Total: 0 COWS:     Musculoskeletal: Strength & Muscle Tone: within normal limits Gait & Station: normal Patient leans: N/A  Psychiatric Specialty Exam: Physical Exam  Nursing note and vitals reviewed. Constitutional: She is oriented to person, place, and time. She appears well-developed and well-nourished.  HENT:  Head: Normocephalic and atraumatic.  Respiratory: Effort normal.  Neurological: She is alert and oriented to person, place, and time.    ROS  Blood pressure 120/79, pulse 98, temperature 98.3 F (36.8 C), temperature source Oral, resp. rate 18, height 5\' 4"  (1.626 m), weight 122 kg, last menstrual period 08/21/2019, SpO2 97 %.Body mass index is 46.17 kg/m.  General Appearance: Casual  Eye Contact:  Fair  Speech:  Normal Rate  Volume:  Normal  Mood:  Euthymic  Affect:  Congruent  Thought Process:  Goal Directed and Descriptions of Associations: Circumstantial  Orientation:  Full (Time, Place, and Person)  Thought Content:  Hallucinations: Auditory  Suicidal Thoughts:  No  Homicidal Thoughts:  No  Memory:  Immediate;   Fair Recent;   Fair Remote;   Fair  Judgement:  Intact  Insight:  Fair  Psychomotor Activity:  Normal  Concentration:  Concentration: Fair and Attention Span: Fair  Recall:  [employer] of Knowledge:  Fair  Language:  Good  Akathisia:  Negative  Handed:  Right  AIMS (if indicated):     Assets:  Desire for Improvement Resilience  ADL's:  Intact  Cognition:  WNL  Sleep:  Number of Hours: 6     Treatment Plan Summary: Daily contact  with patient to assess and  evaluate symptoms and progress in treatment, Medication management and Plan : Patient is seen and examined.  Patient is a 33 year old female with the above-stated past psychiatric history who is seen in follow-up.   Diagnosis: #1 schizophrenia, #2 alcohol use disorder  Patient is seen in follow-up.  Her paranoia seems to be decreasing, and as well the hallucinations are decreasing.  She is less irritable and argumentative this morning.  She is still requesting to be discharged home.  I have asked social work to talk with her family and see how they feel like the communication is gone over the telephone.  The patient stated that she had not had them come visit because she was worried "they would smell like cigarettes and that would make me want to have a cigarette".  No change in her current medications.  If she continues to improve I would anticipate discharge in 1 to 2 days.  1.  Continue lorazepam 1 mg p.o. every 6 hours as needed a CIWA greater than 10. 2.  Increase Risperdal to 3 mg p.o. twice daily for mood stability and psychosis. 3.  Continue trazodone 100 mg p.o. nightly as needed insomnia. 4.  Continue thiamine 100 mg p.o. daily for nutritional supplementation. 5.  Disposition planning-in progress. Antonieta Pert, MD 09/03/2019, 1:35 PM

## 2019-09-04 MED ORDER — TRAZODONE HCL 50 MG PO TABS: 50.0000 mg | ORAL_TABLET | Freq: Every evening | ORAL | 0 refills | Status: DC | PRN

## 2019-09-04 MED ORDER — RISPERIDONE 3 MG PO TABS: 3.0000 mg | ORAL_TABLET | Freq: Two times a day (BID) | ORAL | 0 refills | Status: DC

## 2019-09-04 NOTE — Plan of Care (Signed)
  Problem: Education: Goal: Knowledge of [facility] Education information/materials will improve Outcome: Progressing Goal: Emotional status will improve Outcome: Progressing Goal: Mental status will improve Outcome: Progressing  D: Patient is calm and cooperative. Denies SI, HI and AVH. Asking when she is being discharged. A: Continue to monitor for safety R: Safety maintained.

## 2019-09-04 NOTE — Progress Notes (Signed)
  [facility] Adult Case Management Discharge Plan :  Will you be returning to the same living situation after discharge:  Yes,  lives with relatives At discharge, do you have transportation home?: Yes,  relatives will pick up Do you have the ability to pay for your medications: Yes,  medicaid  Release of information consent forms completed and in the chart;  Patient's signature needed at discharge.  Patient to Follow up at: Follow-up Information    [facility] Follow up on 09/08/2019.   Why: You are scheduled for a zoom meeting with Sherrian Divers on Monday, November 30th at 7am. Thank you. Contact information: [place] [address] [phone]           Next level of care provider has access to [place] and Suicide Prevention discussed: Yes,  with pt sister  Have you used any form of tobacco in the last 30 days? (Cigarettes, Smokeless Tobacco, Cigars, and/or Pipes): Yes  Has patient been referred to the Quitline?: Patient refused referral  Patient has been referred for addiction treatment: Pt. refused referral  Yvette Rack, LCSW 09/04/2019, 10:11 AM

## 2019-09-04 NOTE — Discharge Summary (Signed)
Physician Discharge Summary Note  Patient:  [name] is an 33 y.o., female MRN:  [id number] DOB:  09-Aug-1986 Patient phone:  [phone] (home)  Patient address:   [address] [place] [address],  Total Time spent with patient: 20 minutes  Date of Admission:  08/29/2019 Date of Discharge: 09/04/2019  Reason for Admission: Psychosis  Principal Problem: Schizophrenia [facility]) Discharge Diagnoses: Principal Problem:   Schizophrenia (HCC) Active Problems:   Alcohol abuse   Past Psychiatric History: See admission H&P  Past Medical History:  Past Medical History:  Diagnosis Date  . Abdominal pain affecting pregnancy, antepartum 02/28/2016  . Anemia    LAST HGB 04-03-17 12.5  . Anxiety   . Depression   . Gallstones 2018  . Incisional ventral hernia w obstruction   . Obesity 09/12/2013   Last Assessment & Plan:  Obesity in pregnancy is associated with maternal and obstetric complications including increased risk of gestational DM, gestational HTN, preeclampsia, thromboembolism, failed induction of labor, Cesarean section, excessive gestational weight gain and post-partum weight retention. Obesity is also associated with fetal complications including prematurity, stillbirth, congen  . Post-operative state 06-14-2016  . SIDS (sudden infant death syndrome) Sep 12, 2013   Last Assessment & Plan:  Ms. Kamm third son died at three months of age from SIDS. I reviewed with Ms. Hakes that the cause of SIDS is unknown. I did review with her that placing infants to sleep on their backs without soft bedding and tobacco cessation are potentially modifiable risk factors to decrease her risk of SIDS.  Marland Kitchen Small bowel obstruction (HCC)   . Supervision of high risk pregnancy in third trimester 2013-09-12   Last Assessment & Plan:  Ms. Desrosier is receiving her care at [facility]. She reports having an appointment tomorrow 12/3. I encouraged her to get a flu and TDAP vaccination. She  is due for her CBC, RPR, third trimester HIV and glucola.   . Tobacco abuse 09-12-2013   Last Assessment & Plan:  I discussed with Ms. Riddle that the risks of smoking in pregnancy include spontaneous pregnancy loss, placental abruption, preterm premature rupture of membranes (PPROM), placenta previa, preterm labor and delivery, and low birth weight (LBW) as well as SIDS. I discussed with her that her tobacco use is a modifiable risk factor for risk of placental abruption and SIDS, b    Past Surgical History:  Procedure Laterality Date  . CESAREAN SECTION N/A 06/12/2016   Procedure: STAT CESAREAN SECTION WITH BILATERAL STERILIZATION;  Surgeon: Suzy Bouchard, MD;  Location: [facility] ORS;  Service: Obstetrics;  Laterality: N/A;  Baby Girl @ 2246 Weight 5 lb 10 oz Apgars 8/9  . ENDOSCOPIC RETROGRADE CHOLANGIOPANCREATOGRAPHY (ERCP) WITH PROPOFOL N/A 06/06/2017   Procedure: ENDOSCOPIC RETROGRADE CHOLANGIOPANCREATOGRAPHY (ERCP) WITH PROPOFOL;  Surgeon: Midge Minium, MD;  Location: [facility] ENDOSCOPY;  Service: Endoscopy;  Laterality: N/A;  . ERCP N/A 10/30/2017   Procedure: ENDOSCOPIC RETROGRADE CHOLANGIOPANCREATOGRAPHY (ERCP) STENT REMOVAL;  Surgeon: Midge Minium, MD;  Location: [facility] ENDOSCOPY;  Service: Endoscopy;  Laterality: N/A;  . INCISIONAL HERNIA REPAIR N/A 05/07/2017   Procedure: LAPAROSCOPIC INCISIONAL HERNIA;  Surgeon: Henrene Dodge, MD;  Location: [facility] ORS;  Service: General;  Laterality: N/A;  . TUBAL LIGATION    . VENTRAL HERNIA REPAIR N/A 06/19/2016   Procedure: HERNIA REPAIR VENTRAL ADULT;  Surgeon: Lattie Haw, MD;  Location: [facility] ORS;  Service: General;  Laterality: N/A;   Family History:  Family History  Problem Relation Age of Onset  .  Diabetes Mother   . Asthma Sister   . Hypertension Maternal Aunt   . Hypertension Maternal Uncle    Family Psychiatric  History: See admission H&P Social History:  Social History   Substance and Sexual Activity  Alcohol Use Yes  .  Alcohol/week: 7.0 standard drinks  . Types: 7 Cans of beer per week   Comment: 2 40oz/day     Social History   Substance and Sexual Activity  Drug Use Yes   Comment: "pills"    Social History   Socioeconomic History  . Marital status: Single    Spouse name: Not on file  . Number of children: Not on file  . Years of education: Not on file  . Highest education level: Not on file  Occupational History  . Not on file  Social Needs  . Financial resource strain: Not on file  . Food insecurity    Worry: Not on file    Inability: Not on file  . Transportation needs    Medical: Not on file    Non-medical: Not on file  Tobacco Use  . Smoking status: Current Every Day Smoker    Packs/day: 1.50    Years: 10.00    Pack years: 15.00    Types: Cigarettes  . Smokeless tobacco: Never Used  Substance and Sexual Activity  . Alcohol use: Yes    Alcohol/week: 7.0 standard drinks    Types: 7 Cans of beer per week    Comment: 2 40oz/day  . Drug use: Yes    Comment: "pills"  . Sexual activity: Never  Lifestyle  . Physical activity    Days per week: Not on file    Minutes per session: Not on file  . Stress: Not on file  Relationships  . Social Musician on phone: Not on file    Gets together: Not on file    Attends religious service: Not on file    Active member of club or organization: Not on file    Attends meetings of clubs or organizations: Not on file    Relationship status: Not on file  Other Topics Concern  . Not on file  Social History Narrative  . Not on file    Hospital Course:  33 year old woman with a diagnosis of schizophrenia and alcohol abuse.  Commitment petition filed claiming that she had texted suicidal threats to her sister and to her children.  Patient's chief complaint is "I was whooping and hollering when I was drunk".  Patient says she drinks heavily every day but yesterday was drinking even more than usual.  When confronted with the allegation  of texting suicidal statements however she denies it.  She denies any suicidal or homicidal thought.  Patient is not an easy historian.  Sleepy and mutters much of the time.  Not necessarily responding to internal stimuli but does not seem very forthcoming.  She says that sometimes she has hallucinations.  Will not describe any detail about them.  She says that usually she takes her medicines regularly but she cannot remember exactly whether she has been taking them lately.  Denies any drug use except alcohol.  Blood alcohol level was only about 50 on presentation to the emergency room.  She was admitted to the psychiatric unit and continued on Risperdal as well as trazodone.  The dosage on admission was 1 mg p.o. twice daily.  I initially saw the patient on 09/01/2019.  The patient was still not sleeping  well.  I increased her Risperdal to 2 mg p.o. twice daily.  Lorazepam was also added for 1 mg p.o. every 6 hours as needed a CIWA greater than 10.  She had some improvement, but continued to voice auditory hallucinations as well as paranoia.  The dosage of Risperdal was increased to 3 mg p.o. twice daily.  She tolerated this well.  The night prior to discharge she denied any auditory or visual hallucinations.  This continued the next morning and her sleep improved significantly with the combination of Risperdal 3 mg and trazodone.  On the morning of 09/04/2019 she was doing well.  She denied any auditory or visual hallucinations.  There was no evidence of any acute psychosis.  She denied any suicidal or homicidal ideation.  It was decided she can be discharged home this date.  Physical Findings: AIMS:  , ,  ,  ,    CIWA:  CIWA-Ar Total: 0 COWS:     Musculoskeletal: Strength & Muscle Tone: within normal limits Gait & Station: normal Patient leans: N/A  Psychiatric Specialty Exam: Physical Exam  Nursing note and vitals reviewed. Constitutional: She is oriented to person, place, and time. She appears  well-developed and well-nourished.  HENT:  Head: Normocephalic and atraumatic.  Respiratory: Effort normal.  Neurological: She is alert and oriented to person, place, and time.    ROS  Blood pressure 116/68, pulse (!) 102, temperature 98.9 F (37.2 C), temperature source Oral, resp. rate 18, height 5\' 4"  (1.626 m), weight 122 kg, last menstrual period 08/21/2019, SpO2 97 %.Body mass index is 46.17 kg/m.  General Appearance: Casual  Eye Contact:  Fair  Speech:  Normal Rate  Volume:  Normal  Mood:  Euthymic  Affect:  Congruent  Thought Process:  Coherent and Descriptions of Associations: Intact  Orientation:  Full (Time, Place, and Person)  Thought Content:  Logical  Suicidal Thoughts:  No  Homicidal Thoughts:  No  Memory:  Immediate;   Fair Recent;   Fair Remote;   Fair  Judgement:  Intact  Insight:  Fair  Psychomotor Activity:  Normal  Concentration:  Concentration: Fair and Attention Span: Fair  Recall:  [employer] of Knowledge:  Fair  Language:  Fair  Akathisia:  Negative  Handed:  Right  AIMS (if indicated):     Assets:  Desire for Improvement Resilience  ADL's:  Intact  Cognition:  WNL  Sleep:  Number of Hours: 6.5     Have you used any form of tobacco in the last 30 days? (Cigarettes, Smokeless Tobacco, Cigars, and/or Pipes): Yes  Has this patient used any form of tobacco in the last 30 days? (Cigarettes, Smokeless Tobacco, Cigars, and/or Pipes) Yes, Yes, Prescription not provided because: Wants to continue to smoke.  Blood Alcohol level:  Lab Results  Component Value Date   ETH 50 (H) 08/28/2019   ETH 187 (H) 16/07/9603    Metabolic Disorder Labs:  No results found for: HGBA1C, MPG Lab Results  Component Value Date   PROLACTIN 69.2 (H) 08/30/2019   Lab Results  Component Value Date   CHOL 183 08/30/2019   TRIG 66 08/30/2019   HDL 56 08/30/2019   CHOLHDL 3.3 08/30/2019   VLDL 13 08/30/2019   LDLCALC 114 (H) 08/30/2019    See Psychiatric  Specialty Exam and Suicide Risk Assessment completed by Attending Physician prior to discharge.  Discharge destination:  Home  Is patient on multiple antipsychotic therapies at discharge:  No   Has  Patient had three or more failed trials of antipsychotic monotherapy by history:  No  Recommended Plan for Multiple Antipsychotic Therapies: NA  Discharge Instructions    Diet - low sodium heart healthy   Complete by: As directed    Increase activity slowly   Complete by: As directed      Allergies as of 09/04/2019      Reactions   Amoxicillin    Vicodin [hydrocodone-acetaminophen]       Medication List    TAKE these medications     Indication  risperiDONE 3 MG tablet Commonly known as: RISPERDAL Take 1 tablet (3 mg total) by mouth 2 (two) times daily. What changed:   medication strength  how much to take  Indication: Schizophrenia   traZODone 50 MG tablet Commonly known as: DESYREL Take 1 tablet (50 mg total) by mouth at bedtime as needed for sleep. What changed:   medication strength  how much to take  when to take this  reasons to take this  Indication: Trouble Sleeping      Follow-up Information    [facility] Follow up on 09/08/2019.   Why: You are scheduled for a zoom meeting with Unk PintoHarvey Bryant on Monday, November 30th at 7am. Thank you. Contact information: [address] [place] [address] [phone]           Follow-up recommendations:  Activity:  ad lib  Comments: None  Signed: Antonieta PertGreg Lawson Florena Kozma, MD 09/04/2019, 11:37 AM

## 2019-09-04 NOTE — Progress Notes (Signed)
Patient denies SI/HI, denies A/V hallucinations. Patient verbalizes understanding of discharge instructions, follow up care and prescriptions. Patient given all belongings from [facility] locker. Patient escorted out by staff, transported by family. 

## 2019-09-04 NOTE — Progress Notes (Signed)
D: Patient is calm and cooperative. Denies SI, HI and AVH. Asking when she is being discharged. A: Continue to monitor for safety R: Safety maintained.

## 2019-09-04 NOTE — BHH Suicide Risk Assessment (Signed)
[facility] Discharge Suicide Risk Assessment   Principal Problem: Schizophrenia [facility]) Discharge Diagnoses: Principal Problem:   Schizophrenia ([place]) Active Problems:   Alcohol abuse   Total Time spent with patient: 20 minutes  Musculoskeletal: Strength & Muscle Tone: within normal limits Gait & Station: normal Patient leans: N/A  Psychiatric Specialty Exam: Review of Systems  All other systems reviewed and are negative.   Blood pressure 116/68, pulse (!) 102, temperature 98.9 F (37.2 C), temperature source Oral, resp. rate 18, height 5\' 4"  (1.626 m), weight 122 kg, last menstrual period 08/21/2019, SpO2 97 %.Body mass index is 46.17 kg/m.  General Appearance: Casual  Eye Contact::  Fair  Speech:  Normal Rate409  Volume:  Normal  Mood:  Euthymic  Affect:  Congruent  Thought Process:  Coherent and Descriptions of Associations: Intact  Orientation:  Full (Time, Place, and Person)  Thought Content:  Logical  Suicidal Thoughts:  No  Homicidal Thoughts:  No  Memory:  Immediate;   Fair Recent;   Fair Remote;   Fair  Judgement:  Intact  Insight:  Fair  Psychomotor Activity:  Normal  Concentration:  Fair  Recall:  [employer] of Knowledge:Fair  Language: Good  Akathisia:  Negative  Handed:  Right  AIMS (if indicated):     Assets:  Desire for Improvement Housing Resilience  Sleep:  Number of Hours: 6.5  Cognition: WNL  ADL's:  Intact   Mental Status Per Nursing Assessment::   On Admission:  NA(denies currently)  Demographic Factors:  Low socioeconomic status and Unemployed  Loss Factors: NA  Historical Factors: Impulsivity  Risk Reduction Factors:   Sense of responsibility to family, Living with another person, especially a relative and Positive social support  Continued Clinical Symptoms:  Alcohol/Substance Abuse/Dependencies Schizophrenia:   Paranoid or undifferentiated type  Cognitive Features That Contribute To Risk:  None    Suicide Risk:  Minimal: No  identifiable suicidal ideation.  Patients presenting with no risk factors but with morbid ruminations; may be classified as minimal risk based on the severity of the depressive symptoms  Follow-up Information    [facility] Follow up on 09/08/2019.   Why: You are scheduled for a zoom meeting with Sherrian Divers on Monday, November 30th at 7am. Thank you. Contact information: [place] [phone]           Plan Of Care/Follow-up recommendations:  Activity:  ad lib  Sharma Covert, MD 09/04/2019, 9:14 AM

## 2019-10-13 ENCOUNTER — Other Ambulatory Visit: Payer: Self-pay

## 2019-10-13 ENCOUNTER — Emergency Department
Admission: EM | Admit: 2019-10-13 | Discharge: 2019-10-14 | Disposition: A | Discharge status: Other Acute Inpatient Hospital | Payer: Medicaid Other | Attending: Emergency Medicine | Admitting: Emergency Medicine

## 2019-10-13 ENCOUNTER — Encounter: Payer: Self-pay | Admitting: Emergency Medicine

## 2019-10-13 DIAGNOSIS — R45851 Suicidal ideations: Secondary | ICD-10-CM

## 2019-10-13 DIAGNOSIS — F17210 Nicotine dependence, cigarettes, uncomplicated: Secondary | ICD-10-CM | POA: Diagnosis not present

## 2019-10-13 DIAGNOSIS — Z20822 Contact with and (suspected) exposure to covid-19: Secondary | ICD-10-CM | POA: Insufficient documentation

## 2019-10-13 DIAGNOSIS — Z79899 Other long term (current) drug therapy: Secondary | ICD-10-CM | POA: Diagnosis not present

## 2019-10-13 DIAGNOSIS — F250 Schizoaffective disorder, bipolar type: Secondary | ICD-10-CM | POA: Diagnosis not present

## 2019-10-13 DIAGNOSIS — F209 Schizophrenia, unspecified: Secondary | ICD-10-CM | POA: Diagnosis present

## 2019-10-13 HISTORY — DX: Schizophrenia, unspecified: F20.9

## 2019-10-13 LAB — URINE DRUG SCREEN, QUALITATIVE (ARMC ONLY)
Amphetamines, Ur Screen: NOT DETECTED
Barbiturates, Ur Screen: NOT DETECTED
Benzodiazepine, Ur Scrn: NOT DETECTED
Cannabinoid 50 Ng, Ur ~~LOC~~: NOT DETECTED
Cocaine Metabolite,Ur ~~LOC~~: NOT DETECTED
MDMA (Ecstasy)Ur Screen: NOT DETECTED
Methadone Scn, Ur: NOT DETECTED
Opiate, Ur Screen: NOT DETECTED
Phencyclidine (PCP) Ur S: NOT DETECTED
Tricyclic, Ur Screen: NOT DETECTED

## 2019-10-13 LAB — COMPREHENSIVE METABOLIC PANEL
ALT: 22 U/L (ref 0–44)
AST: 23 U/L (ref 15–41)
Albumin: 4.2 g/dL (ref 3.5–5.0)
Alkaline Phosphatase: 55 U/L (ref 38–126)
Anion gap: 11 (ref 5–15)
BUN: 8 mg/dL (ref 6–20)
CO2: 20 mmol/L — ABNORMAL LOW (ref 22–32)
Calcium: 9.4 mg/dL (ref 8.9–10.3)
Chloride: 105 mmol/L (ref 98–111)
Creatinine, Ser: 0.51 mg/dL (ref 0.44–1.00)
GFR calc Af Amer: >60 mL/min (ref >60)
GFR calc non Af Amer: >60 mL/min (ref >60)
Glucose, Bld: 111 mg/dL — ABNORMAL HIGH (ref 70–99)
Potassium: 4 mmol/L (ref 3.5–5.1)
Sodium: 136 mmol/L (ref 135–145)
Total Bilirubin: 0.7 mg/dL (ref 0.3–1.2)
Total Protein: 8.2 g/dL — ABNORMAL HIGH (ref 6.5–8.1)

## 2019-10-13 LAB — ETHANOL: Alcohol, Ethyl (B): <10 mg/dL (ref <10)

## 2019-10-13 LAB — CBC
HCT: 41.7 % (ref 36.0–46.0)
Hemoglobin: 13.9 g/dL (ref 12.0–15.0)
MCH: 27.4 pg (ref 26.0–34.0)
MCHC: 33.3 g/dL (ref 30.0–36.0)
MCV: 82.1 fL (ref 80.0–100.0)
Platelets: 475 10*3/uL — ABNORMAL HIGH (ref 150–400)
RBC: 5.08 MIL/uL (ref 3.87–5.11)
RDW: 15.1 % (ref 11.5–15.5)
WBC: 9.6 10*3/uL (ref 4.0–10.5)
nRBC: 0 % (ref 0.0–0.2)

## 2019-10-13 LAB — ACETAMINOPHEN LEVEL: Acetaminophen (Tylenol), Serum: <10 ug/mL — ABNORMAL LOW (ref 10–30)

## 2019-10-13 LAB — RESPIRATORY PANEL BY RT PCR (FLU A&B, COVID)
Influenza A by PCR: NEGATIVE
Influenza B by PCR: NEGATIVE
SARS Coronavirus 2 by RT PCR: NEGATIVE

## 2019-10-13 LAB — SALICYLATE LEVEL: Salicylate Lvl: <7 mg/dL — ABNORMAL LOW (ref 7.0–30.0)

## 2019-10-13 NOTE — ED Notes (Signed)
Dressed out by this Charity fundraiser and mel NT  One pair pants One pair flip flops One tshirt 1 zip up sweatshirt 1 sports bra

## 2019-10-13 NOTE — ED Notes (Signed)
IVC  FROM  [facility]  GOING  TO  [facility]  IN THE  AM

## 2019-10-13 NOTE — ED Notes (Signed)
Hourly rounding reveals patient sleeping in room. No complaints, stable, in no acute distress. Q15 minute rounds and monitoring via Security Cameras to continue. 

## 2019-10-13 NOTE — ED Notes (Signed)
Hourly rounding reveals patient sleeping in room. No complaints, stable, in no acute distress. Q15 minute rounds and monitoring via Rover and Officer to continue.  

## 2019-10-13 NOTE — ED Triage Notes (Signed)
Here IVC with [facility] from [facility].  Per IVC papers pt told her sister to come get the kids and she was going to take pills. Pt admits at the time she told her sister she was going to kill herself but now reports it was not serious. When asked questions for triage suicide screening pt will only state "I don't know", will not give a yes or no answer.  VSS in triage. Pt does admit to previous suicide attempt 2-4 weeks ago where she took diet pills.  Pt has poor eye contact and will not directly answer questions at this time.

## 2019-10-13 NOTE — ED Notes (Signed)
Report to include Situation, Background, Assessment, and Recommendations received from Amy B. RN. Patient alert and oriented, warm and dry, in no acute distress. Patient denies SI, HI, AVH and pain. Patient made aware of Q15 minute rounds and Rover and Officer presence for their safety. Patient instructed to come to me with needs or concerns.  

## 2019-10-13 NOTE — BH Assessment (Signed)
TTS received a call from [facility] Clinician Gracelyn Nurse) that pt was being transpoted to [facility] ED by [facility]. She reports pt presented to their facility today under IVC after threatening to overdose on medications. Patients children are currently with pt's sister Jasmine December: [phone]).

## 2019-10-13 NOTE — BH Assessment (Signed)
**  BED ACCEPTANCE PENDING COVID TEST RESULTS**  Patient has been accepted to [facility].  Patient assigned to [facility] Accepting physician is Dr. Estill Cotta.  Call report to [phone].  Representative was [employer] Nutritional therapist) & Delaney Meigs [facility] Intake).   ER Staff is aware of it:  Misty Stanley, ER Secretary  Dr. Larinda Buttery, ER MD  Amy B., Patient's Nurse   *Patient can be transported to accepting facility tomorrow after 8:00am  Fax COVID results to [phone]

## 2019-10-13 NOTE — ED Provider Notes (Signed)
[facility] Emergency Department Provider Note   ____________________________________________   First MD Initiated Contact with Patient 10/13/19 1844     (approximate)  I have reviewed the triage vital signs and the nursing notes.   HISTORY  Chief Complaint Psychiatric Evaluation   HPI [name] is a 34 y.o. female with possible history of schizophrenia who presents to the ED for suicidal ideation.  Patient states "I said something I did not mean and then they brought me here".  Per paperwork, she threatened to overdose on her medications in order to take her life.  Police were called and patient was placed under IVC, subsequently brought to [facility] for further evaluation.  She was evaluated there and was accepted for admission over at [facility], but does not have a place until the morning.  She denies any medical complaints at this time.        Past Medical History:  Diagnosis Date  . Abdominal pain affecting pregnancy, antepartum 02/28/2016  . Anemia    LAST HGB 04-03-17 12.5  . Anxiety   . Depression   . Gallstones 2018  . Incisional ventral hernia w obstruction   . Obesity 09-30-13   Last Assessment & Plan:  Obesity in pregnancy is associated with maternal and obstetric complications including increased risk of gestational DM, gestational HTN, preeclampsia, thromboembolism, failed induction of labor, Cesarean section, excessive gestational weight gain and post-partum weight retention. Obesity is also associated with fetal complications including prematurity, stillbirth, congen  . Post-operative state 06/13/2016  . Schizophrenia (HCC)   . SIDS (sudden infant death syndrome) 30-Sep-2013   Last Assessment & Plan:  Ms. Huether third son died at three months of age from SIDS. I reviewed with Ms. Vonbehren that the cause of SIDS is unknown. I did review with her that placing infants to sleep on their backs without soft bedding and tobacco cessation are  potentially modifiable risk factors to decrease her risk of SIDS.  Marland Kitchen Small bowel obstruction (HCC)   . Supervision of high risk pregnancy in third trimester 09/30/13   Last Assessment & Plan:  Ms. Thede is receiving her care at [facility]. She reports having an appointment tomorrow 12/3. I encouraged her to get a flu and TDAP vaccination. She is due for her CBC, RPR, third trimester HIV and glucola.   . Tobacco abuse 09-30-13   Last Assessment & Plan:  I discussed with Ms. Moncada that the risks of smoking in pregnancy include spontaneous pregnancy loss, placental abruption, preterm premature rupture of membranes (PPROM), placenta previa, preterm labor and delivery, and low birth weight (LBW) as well as SIDS. I discussed with her that her tobacco use is a modifiable risk factor for risk of placental abruption and SIDS, b    Patient Active Problem List   Diagnosis Date Noted  . Schizophrenia (HCC) 08/29/2019  . Alcohol abuse 08/29/2019  . Hallucinations, unspecified 08/28/2019  . Suicidal thoughts 08/28/2019  . Schizoaffective disorder, bipolar type (HCC) 08/23/2019  . Psychosis, paranoid (HCC) 08/02/2019  . Calculus of bile duct without cholecystitis with obstruction   . Encounter for fitting and adjustment of other gastrointestinal appliance and device   . Jaundice   . Obstruction of bile duct   . Choledocholithiasis 06/05/2017  . Incisional hernia 05/07/2017  . Incisional ventral hernia w obstruction   . Small bowel obstruction (HCC)   . Post-operative state 06/13/2016  . Abdominal pain affecting pregnancy, antepartum 02/28/2016  . IUFD (intrauterine fetal  death) 24-Sep-2013  . Obesity 2013-09-24  . SIDS (sudden infant death syndrome) Sep 24, 2013  . Supervision of high risk pregnancy in third trimester 2013/09/24  . Tobacco abuse 2013-09-24    Past Surgical History:  Procedure Laterality Date  . CESAREAN SECTION N/A 06/12/2016   Procedure: STAT CESAREAN SECTION WITH  BILATERAL STERILIZATION;  Surgeon: Suzy Bouchard, MD;  Location: [facility] ORS;  Service: Obstetrics;  Laterality: N/A;  Baby Girl @ 2246 Weight 5 lb 10 oz Apgars 8/9  . ENDOSCOPIC RETROGRADE CHOLANGIOPANCREATOGRAPHY (ERCP) WITH PROPOFOL N/A 06/06/2017   Procedure: ENDOSCOPIC RETROGRADE CHOLANGIOPANCREATOGRAPHY (ERCP) WITH PROPOFOL;  Surgeon: Midge Minium, MD;  Location: [facility] ENDOSCOPY;  Service: Endoscopy;  Laterality: N/A;  . ERCP N/A 10/30/2017   Procedure: ENDOSCOPIC RETROGRADE CHOLANGIOPANCREATOGRAPHY (ERCP) STENT REMOVAL;  Surgeon: Midge Minium, MD;  Location: [facility] ENDOSCOPY;  Service: Endoscopy;  Laterality: N/A;  . INCISIONAL HERNIA REPAIR N/A 05/07/2017   Procedure: LAPAROSCOPIC INCISIONAL HERNIA;  Surgeon: Henrene Dodge, MD;  Location: [facility] ORS;  Service: General;  Laterality: N/A;  . TUBAL LIGATION    . VENTRAL HERNIA REPAIR N/A 06/19/2016   Procedure: HERNIA REPAIR VENTRAL ADULT;  Surgeon: Lattie Haw, MD;  Location: [facility] ORS;  Service: General;  Laterality: N/A;    Prior to Admission medications   Medication Sig Start Date End Date Taking? Authorizing Provider  risperiDONE (RISPERDAL) 3 MG tablet Take 1 tablet (3 mg total) by mouth 2 (two) times daily. 09/04/19   Antonieta Pert, MD  traZODone (DESYREL) 50 MG tablet Take 1 tablet (50 mg total) by mouth at bedtime as needed for sleep. 09/04/19   Antonieta Pert, MD    Allergies Amoxicillin and Vicodin [hydrocodone-acetaminophen]  Family History  Problem Relation Age of Onset  . Diabetes Mother   . Asthma Sister   . Hypertension Maternal Aunt   . Hypertension Maternal Uncle     Social History Social History   Tobacco Use  . Smoking status: Current Every Day Smoker    Packs/day: 1.50    Years: 10.00    Pack years: 15.00    Types: Cigarettes  . Smokeless tobacco: Never Used  Substance Use Topics  . Alcohol use: Yes    Alcohol/week: 7.0 standard drinks    Types: 7 Cans of beer per week    Comment: 2  40oz/day  . Drug use: Yes    Comment: "pills"    Review of Systems  Constitutional: No fever/chills Eyes: No visual changes. ENT: No sore throat. Cardiovascular: Denies chest pain. Respiratory: Denies shortness of breath. Gastrointestinal: No abdominal pain.  No nausea, no vomiting.  No diarrhea.  No constipation. Genitourinary: Negative for dysuria. Musculoskeletal: Negative for back pain. Skin: Negative for rash. Neurological: Negative for headaches, focal weakness or numbness.  Positive for suicidal ideation.  ____________________________________________   PHYSICAL EXAM:  VITAL SIGNS: ED Triage Vitals  Enc Vitals Group     BP 10/13/19 1834 138/89     Pulse Rate 10/13/19 1834 93     Resp 10/13/19 1834 20     Temp 10/13/19 1834 98.1 F (36.7 C)     Temp Source 10/13/19 1834 Oral     SpO2 10/13/19 1834 100 %     Weight 10/13/19 1835 300 lb (136.1 kg)     Height 10/13/19 1835 5\' 4"  (1.626 m)     Head Circumference --      Peak Flow --      Pain Score 10/13/19 1835 5     Pain Loc --  Pain Edu? --      Excl. in [place]? --     Constitutional: Alert and oriented. Eyes: Conjunctivae are normal. Head: Atraumatic. Nose: No congestion/rhinnorhea. Mouth/Throat: Mucous membranes are moist. Neck: Normal ROM Cardiovascular: Normal rate, regular rhythm. Grossly normal heart sounds. Respiratory: Normal respiratory effort.  No retractions. Lungs CTAB. Gastrointestinal: Soft and nontender. No distention. Genitourinary: deferred Musculoskeletal: No lower extremity tenderness nor edema. Neurologic:  Normal speech and language. No gross focal neurologic deficits are appreciated. Skin:  Skin is warm, dry and intact. No rash noted. Psychiatric: Mood and affect are normal. Speech and behavior are normal.  ____________________________________________   LABS (all labs ordered are listed, but only abnormal results are displayed)  Labs Reviewed  COMPREHENSIVE METABOLIC PANEL -  Abnormal; Notable for the following components:      Result Value   CO2 20 (*)    Glucose, Bld 111 (*)    Total Protein 8.2 (*)    All other components within normal limits  SALICYLATE LEVEL - Abnormal; Notable for the following components:   Salicylate Lvl <7.7 (*)    All other components within normal limits  ACETAMINOPHEN LEVEL - Abnormal; Notable for the following components:   Acetaminophen (Tylenol), Serum <10 (*)    All other components within normal limits  CBC - Abnormal; Notable for the following components:   Platelets 475 (*)    All other components within normal limits  RESPIRATORY PANEL BY RT PCR (FLU A&B, COVID)  ETHANOL  URINE DRUG SCREEN, QUALITATIVE ([facility] ONLY)  POC URINE PREG, ED     PROCEDURES  Procedure(s) performed (including Critical Care):  Procedures   ____________________________________________   INITIAL IMPRESSION / ASSESSMENT AND PLAN / ED COURSE       34 year old female with past medical history of schizophrenia presents to the ED under IVC from [facility] due to concern for SI and overdose.  Patient states that she no longer feels suicidal and that she made a mistake earlier, she remains calm and cooperative.  She denies any medical complaints at this time and labs are unremarkable, patient medically cleared.  We will plan to observe until she is able to be placed at [facility] tomorrow morning.      ____________________________________________   FINAL CLINICAL IMPRESSION(S) / ED DIAGNOSES  Final diagnoses:  Suicidal ideation     ED Discharge Orders    None       Note:  This document was prepared using Dragon voice recognition software and may include unintentional dictation errors.   Blake Divine, MD 10/13/19 2350

## 2019-10-14 NOTE — ED Notes (Signed)
PATIENT RIDE TO [facility] TO PICK UP PATIENT. PATIENT BELONGINGS GIVEN. PATIENT DRESSED AND ON WAY TO [facility].

## 2019-10-14 NOTE — ED Notes (Signed)
Hourly rounding reveals patient sleeping in room. No complaints, stable, in no acute distress. Q15 minute rounds and monitoring via Rover and Officer to continue.  

## 2019-10-14 NOTE — ED Notes (Signed)
Assumed care of patient, patient lying in bed awake. Denies SI/HV/H. Reports knowledge of being transfer today to [facility]. Patient calm and cooperative. Safety maintained. Will continue to monitor.

## 2019-10-14 NOTE — ED Provider Notes (Signed)
-----------------------------------------   6:06 AM on 10/14/2019 -----------------------------------------   Blood pressure 139/90, pulse (!) 102, temperature 98.2 F (36.8 C), temperature source Oral, resp. rate (!) 24, height 5\' 4"  (1.626 m), weight 136.1 kg, SpO2 95 %.  The patient is sleeping at this time.  There have been no acute events since the last update.  Awaiting disposition plan from Behavioral Medicine and/or Social Work team(s).   , MD 10/14/19 [phone]

## 2023-04-27 ENCOUNTER — Other Ambulatory Visit: Payer: Self-pay

## 2023-04-27 ENCOUNTER — Emergency Department
Admission: EM | Admit: 2023-04-27 | Discharge: 2023-04-27 | Disposition: A | Discharge status: Home / Self Care | Payer: MEDICAID | Attending: Emergency Medicine | Admitting: Emergency Medicine

## 2023-04-27 ENCOUNTER — Emergency Department: Payer: MEDICAID

## 2023-04-27 DIAGNOSIS — R1011 Right upper quadrant pain: Secondary | ICD-10-CM | POA: Diagnosis present

## 2023-04-27 DIAGNOSIS — K8050 Calculus of bile duct without cholangitis or cholecystitis without obstruction: Secondary | ICD-10-CM

## 2023-04-27 DIAGNOSIS — R110 Nausea: Secondary | ICD-10-CM | POA: Diagnosis not present

## 2023-04-27 DIAGNOSIS — R1010 Upper abdominal pain, unspecified: Secondary | ICD-10-CM

## 2023-04-27 LAB — CBC
HCT: 41.4 % (ref 36.0–46.0)
Hemoglobin: 13.8 g/dL (ref 12.0–15.0)
MCH: 27.7 pg (ref 26.0–34.0)
MCHC: 33.3 g/dL (ref 30.0–36.0)
MCV: 83 fL (ref 80.0–100.0)
Platelets: 293 10*3/uL (ref 150–400)
RBC: 4.99 MIL/uL (ref 3.87–5.11)
RDW: 13.9 % (ref 11.5–15.5)
WBC: 9 10*3/uL (ref 4.0–10.5)
nRBC: 0 % (ref 0.0–0.2)

## 2023-04-27 LAB — URINALYSIS, ROUTINE W REFLEX MICROSCOPIC
Bilirubin Urine: NEGATIVE
Glucose, UA: NEGATIVE mg/dL
Ketones, ur: 20 mg/dL — AB
Nitrite: NEGATIVE
Protein, ur: 30 mg/dL — AB
Specific Gravity, Urine: 1.026 (ref 1.005–1.030)
pH: 5 (ref 5.0–8.0)

## 2023-04-27 LAB — BASIC METABOLIC PANEL
Anion gap: 11 (ref 5–15)
BUN: 6 mg/dL (ref 6–20)
CO2: 22 mmol/L (ref 22–32)
Calcium: 9 mg/dL (ref 8.9–10.3)
Chloride: 105 mmol/L (ref 98–111)
Creatinine, Ser: 0.48 mg/dL (ref 0.44–1.00)
GFR, Estimated: >60 mL/min (ref >60)
Glucose, Bld: 166 mg/dL — ABNORMAL HIGH (ref 70–99)
Potassium: 3.2 mmol/L — ABNORMAL LOW (ref 3.5–5.1)
Sodium: 138 mmol/L (ref 135–145)

## 2023-04-27 LAB — HEPATIC FUNCTION PANEL
ALT: 38 U/L (ref 0–44)
AST: 38 U/L (ref 15–41)
Albumin: 3.6 g/dL (ref 3.5–5.0)
Alkaline Phosphatase: 67 U/L (ref 38–126)
Bilirubin, Direct: 0.2 mg/dL (ref 0.0–0.2)
Indirect Bilirubin: 0.7 mg/dL (ref 0.3–0.9)
Total Bilirubin: 0.9 mg/dL (ref 0.3–1.2)
Total Protein: 8.1 g/dL (ref 6.5–8.1)

## 2023-04-27 LAB — LIPASE, BLOOD: Lipase: 25 U/L (ref 11–51)

## 2023-04-27 LAB — PREGNANCY, URINE: Preg Test, Ur: NEGATIVE

## 2023-04-27 LAB — TROPONIN I (HIGH SENSITIVITY): Troponin I (High Sensitivity): 3 ng/L (ref <18)

## 2023-04-27 MED ORDER — ONDANSETRON 4 MG PO TBDP: 4.0000 mg | ORAL_TABLET | Freq: Three times a day (TID) | ORAL | 0 refills | Status: DC | PRN

## 2023-04-27 MED ORDER — TRAMADOL HCL 50 MG PO TABS: 50.0000 mg | ORAL_TABLET | Freq: Four times a day (QID) | ORAL | 0 refills | Status: DC | PRN

## 2023-04-27 NOTE — ED Notes (Signed)
See triage notes. Patient has right sided abdominal pain that is radiating into her chest

## 2023-04-27 NOTE — ED Triage Notes (Signed)
Per EMS report, patient c/o right upper quadrant pain. Patient has a history of a gallbladder stent placed in 2018 and removed in 2019. Patient c/o emesis today.  185/109 84 pulse 99% on room air 98 temp

## 2023-04-27 NOTE — Discharge Instructions (Addendum)
Please call the number provided for general surgery to arrange a follow-up appointment.  Return to the emergency department for any return of/worsening abdominal pain, vomiting, or any other symptom concerning to yourself.

## 2023-04-27 NOTE — ED Triage Notes (Signed)
Pt. To ED via EMS for right sided abdominal pain that radiating up to chest since last night. Pt. States she has burning with urination.

## 2023-04-27 NOTE — ED Notes (Signed)
Patient ambulated to First Nurse desk to inquire about where she was in line. Patient requested something to put behind her back. Patient was given a warm blanket.

## 2023-04-27 NOTE — ED Provider Notes (Signed)
[facility] Provider Note    Event Date/Time   First MD Initiated Contact with Patient 04/27/23 1355     (approximate)  History   Chief Complaint: Abdominal Pain and Chest Pain  HPI  [name] is a 37 y.o. female  with a pmhx of schizophrenia, anxiety, gallstones, presents to the ED with abd pain.  Pt states RUQ abd pain since yesterday, has not noted any association with food.  Pt states a hx of known gallstones, but still has her GB.  Has a hx of similar pain in 2020 per pt treated with a stent that was then removed per pt.  No fever. Does states some nausea.    Physical Exam   Triage Vital Signs: ED Triage Vitals  Encounter Vitals Group     BP 04/27/23 1037 136/79     Systolic BP Percentile --      Diastolic BP Percentile --      Pulse Rate 04/27/23 1037 80     Resp 04/27/23 1037 18     Temp 04/27/23 1037 97.6 F (36.4 C)     Temp Source 04/27/23 1037 Oral     SpO2 04/27/23 1037 98 %     Weight --      Height 04/27/23 1038 5' 4.5" (1.638 m)     Head Circumference --      Peak Flow --      Pain Score 04/27/23 1038 10     Pain Loc --      Pain Education --      Exclude from Growth Chart --     Most recent vital signs: Vitals:   04/27/23 1037  BP: 136/79  Pulse: 80  Resp: 18  Temp: 97.6 F (36.4 C)  SpO2: 98%    General: Awake, no distress.  CV:  Good peripheral perfusion.  Regular rate and rhythm  Resp:  Normal effort.  Equal breath sounds bilaterally.  Abd:  No distention.  Soft, mild RUQ ttp, no reb/guarding  ED Results / Procedures / Treatments   RADIOLOGY   I have reviewed/interpretted CXR images, no consolidation seen. Radiology confirms no active disease.   MEDICATIONS ORDERED IN ED: Medications - No data to display   IMPRESSION / MDM / ASSESSMENT AND PLAN / ED COURSE  I reviewed the triage vital signs and the nursing notes.  Patient's presentation is most consistent with acute presentation with potential  threat to life or bodily function.  Pt p/w RUQ abd pain since yesterday.  States mild pain currently.  minimal ttp on exam.  Basic labs are reassuring including UA, cbc, bmp, trop.  I have added on LFTs and a RUQ [place] to further evaluation.  Pt agreeable to plan/workup.  Patient's workup is reassuring, troponin negative, LFTs are normal.  Urinalysis shows no concerning findings.  Patient's ultrasound has resulted showing cholelithiasis with mild gallbladder wall thickening no Murphy sign or pericholecystic fluid.  Does have a mildly enlarged common bile duct.  However liver function tests including bilirubin are normal.  Patient states she is feeling much better.  Given the patient's reassuring workup I believe the patient will be safe for discharge home.  However discussed with the patient sure if her abdominal pain return she should return to the emergency department for further evaluation.  Patient agreeable to plan of care.  FINAL CLINICAL IMPRESSION(S) / ED DIAGNOSES   RUQ abd pain  Note:  This document was prepared using Dragon voice recognition  software and may include unintentional dictation errors.   Minna Antis, MD 04/27/23 1610

## 2023-09-08 ENCOUNTER — Encounter: Payer: Self-pay | Admitting: Emergency Medicine

## 2023-09-08 ENCOUNTER — Ambulatory Visit
Admission: EM | Admit: 2023-09-08 | Discharge: 2023-09-08 | Disposition: A | Discharge status: Home / Self Care | Payer: MEDICAID

## 2023-09-08 DIAGNOSIS — B079 Viral wart, unspecified: Secondary | ICD-10-CM

## 2023-09-08 NOTE — Discharge Instructions (Signed)
The tender area you have in your left thumb as a result of a wart.  This is a proliferation of skin cells that is caused by a virus and causes a hard, tender nodule deformed.  Purchase over-the-counter Compound W wart removal kit and follow the instructions.  If the wart does not resolve with use of over-the-counter Compound W I recommend that you make an appointment with a dermatologist.  I have given you the contact information for [facility].

## 2023-09-08 NOTE — ED Provider Notes (Signed)
[facility]    CSN: [id number] Arrival date & time: 09/08/23  2536      History   Chief Complaint Chief Complaint  Patient presents with   Thumb Pain    left    HPI [name] is a 37 y.o. female.   HPI  37 year old female with a history of anxiety, anemia, obesity, schizophrenia, and small bowel obstruction presents for evaluation of a painful tender area on her left thumb that has been present for at least 2 months.  She denies any fever or drainage from the area.  She also denies injury.  Past Medical History:  Diagnosis Date   Abdominal pain affecting pregnancy, antepartum 02/28/2016   Anemia    LAST HGB 04-03-17 12.5   Anxiety    Depression    Gallstones 2018   Incisional ventral hernia w obstruction    Obesity 09-13-13   Last Assessment & Plan:  Obesity in pregnancy is associated with maternal and obstetric complications including increased risk of gestational DM, gestational HTN, preeclampsia, thromboembolism, failed induction of labor, Cesarean section, excessive gestational weight gain and post-partum weight retention. Obesity is also associated with fetal complications including prematurity, stillbirth, congen   Post-operative state 06/13/2016   Schizophrenia (HCC)    SIDS (sudden infant death syndrome) 09-13-2013   Last Assessment & Plan:  Ms. Mock third son died at three months of age from SIDS. I reviewed with Ms. Woulard that the cause of SIDS is unknown. I did review with her that placing infants to sleep on their backs without soft bedding and tobacco cessation are potentially modifiable risk factors to decrease her risk of SIDS.   Small bowel obstruction [facility])    Supervision of high risk pregnancy in third trimester Sep 13, 2013   Last Assessment & Plan:  Ms. Shortt is receiving her care at [facility]. She reports having an appointment tomorrow 12/3. I encouraged her to get a flu and TDAP vaccination. She is due for her CBC, RPR, third  trimester HIV and glucola.    Tobacco abuse 09-13-13   Last Assessment & Plan:  I discussed with Ms. Harju that the risks of smoking in pregnancy include spontaneous pregnancy loss, placental abruption, preterm premature rupture of membranes (PPROM), placenta previa, preterm labor and delivery, and low birth weight (LBW) as well as SIDS. I discussed with her that her tobacco use is a modifiable risk factor for risk of placental abruption and SIDS, b    Patient Active Problem List   Diagnosis Date Noted   Schizophrenia (HCC) 08/29/2019   Alcohol abuse 08/29/2019   Hallucinations, unspecified 08/28/2019   Suicidal thoughts 08/28/2019   Schizoaffective disorder, bipolar type (HCC) 08/23/2019   Psychosis, paranoid (HCC) 08/02/2019   Calculus of bile duct without cholecystitis with obstruction    Encounter for fitting and adjustment of other gastrointestinal appliance and device    Jaundice    Obstruction of bile duct    Choledocholithiasis 06/05/2017   Incisional hernia 05/07/2017   Incisional ventral hernia w obstruction    Small bowel obstruction (HCC)    Post-operative state 06/13/2016   Abdominal pain affecting pregnancy, antepartum 02/28/2016   IUFD (intrauterine fetal death) 09/13/2013   Obesity September 13, 2013   SIDS (sudden infant death syndrome) 09-13-13   Supervision of high risk pregnancy in third trimester 2013-09-13   Tobacco abuse 09-13-2013    Past Surgical History:  Procedure Laterality Date   CESAREAN SECTION N/A 06/12/2016   Procedure: STAT CESAREAN SECTION WITH  BILATERAL STERILIZATION;  Surgeon: Suzy Bouchard, MD;  Location: [facility] ORS;  Service: Obstetrics;  Laterality: N/A;  Baby Girl @ 2246 Weight 5 lb 10 oz Apgars 8/9   ENDOSCOPIC RETROGRADE CHOLANGIOPANCREATOGRAPHY (ERCP) WITH PROPOFOL N/A 06/06/2017   Procedure: ENDOSCOPIC RETROGRADE CHOLANGIOPANCREATOGRAPHY (ERCP) WITH PROPOFOL;  Surgeon: Midge Minium, MD;  Location: [facility] ENDOSCOPY;  Service: Endoscopy;   Laterality: N/A;   ERCP N/A 10/30/2017   Procedure: ENDOSCOPIC RETROGRADE CHOLANGIOPANCREATOGRAPHY (ERCP) STENT REMOVAL;  Surgeon: Midge Minium, MD;  Location: [facility] ENDOSCOPY;  Service: Endoscopy;  Laterality: N/A;   INCISIONAL HERNIA REPAIR N/A 05/07/2017   Procedure: LAPAROSCOPIC INCISIONAL HERNIA;  Surgeon: Henrene Dodge, MD;  Location: [facility] ORS;  Service: General;  Laterality: N/A;   TUBAL LIGATION     VENTRAL HERNIA REPAIR N/A 06/19/2016   Procedure: HERNIA REPAIR VENTRAL ADULT;  Surgeon: Lattie Haw, MD;  Location: [facility] ORS;  Service: General;  Laterality: N/A;    OB History     Gravida  6   Para  5   Term  4   Preterm  1   AB  1   Living  3      SAB  1   IAB  0   Ectopic  0   Multiple  0   Live Births  2            Home Medications    Prior to Admission medications   Medication Sig Start Date End Date Taking? Authorizing Provider  traZODone (DESYREL) 50 MG tablet Take 1 tablet (50 mg total) by mouth at bedtime as needed for sleep. 09/04/19   Antonieta Pert, MD    Family History Family History  Problem Relation Age of Onset   Diabetes Mother    Asthma Sister    Hypertension Maternal Aunt    Hypertension Maternal Uncle     Social History Social History   Tobacco Use   Smoking status: Every Day    Current packs/day: 1.50    Average packs/day: 1.5 packs/day for 10.0 years (15.0 ttl pk-yrs)    Types: Cigarettes   Smokeless tobacco: Never  Vaping Use   Vaping status: Never Used  Substance Use Topics   Alcohol use: Yes    Alcohol/week: 7.0 standard drinks of alcohol    Types: 7 Cans of beer per week    Comment: 2 40oz/day   Drug use: Yes    Comment: "pills"     Allergies   Amoxicillin and Vicodin [hydrocodone-acetaminophen]   Review of Systems Review of Systems  Constitutional:  Negative for fever.  Skin:  Negative for color change.       Lesion on left thumb     Physical Exam Triage Vital Signs ED Triage Vitals   Encounter Vitals Group     BP      Systolic BP Percentile      Diastolic BP Percentile      Pulse      Resp      Temp      Temp src      SpO2      Weight      Height      Head Circumference      Peak Flow      Pain Score      Pain Loc      Pain Education      Exclude from Growth Chart    No data found.  Updated Vital Signs BP (!) 133/90 (BP Location: Right Arm)  Pulse 85   Temp 98 F (36.7 C) (Oral)   Resp 14   Ht 5' 4.5" (1.638 m)   Wt (!) 300 lb 0.7 oz (136.1 kg)   LMP 08/10/2023 (Approximate)   SpO2 98%   BMI 50.71 kg/m   Visual Acuity Right Eye Distance:   Left Eye Distance:   Bilateral Distance:    Right Eye Near:   Left Eye Near:    Bilateral Near:     Physical Exam Vitals reviewed.  Constitutional:      Appearance: Normal appearance. She is not ill-appearing.  Skin:    General: Skin is warm and dry.     Capillary Refill: Capillary refill takes less than 2 seconds.     Findings: Lesion present. No erythema.  Neurological:     General: No focal deficit present.     Mental Status: She is alert and oriented to person, place, and time.      UC Treatments / Results  Labs (all labs ordered are listed, but only abnormal results are displayed) Labs Reviewed - No data to display  EKG   Radiology No results found.  Procedures Procedures (including critical care time)  Medications Ordered in UC Medications - No data to display  Initial Impression / Assessment and Plan / UC Course  I have reviewed the triage vital signs and the nursing notes.  Pertinent labs & imaging results that were available during my care of the patient were reviewed by me and considered in my medical decision making (see chart for details).   Patient is a nontoxic-appearing 37 year old female presenting for evaluation of left thumb lesion as outlined HPI above.  As you can see the image above, there is a hyper keratinized lesion at the volar aspect of the IP joint of  the left thumb.  There is some hyperpigmentation surrounding the area.  The area is hard.  There is no erythema, induration, or fluctuance.  Patient exam is consistent with a wart.  We discussed using over-the-counter Compound W treatment to facilitate the removal of the wart.  If that does not work she should follow-up with dermatology.  Final Clinical Impressions(s) / UC Diagnoses   Final diagnoses:  Viral wart on left thumb     Discharge Instructions      The tender area you have in your left thumb as a result of a wart.  This is a proliferation of skin cells that is caused by a virus and causes a hard, tender nodule deformed.  Purchase over-the-counter Compound W wart removal kit and follow the instructions.  If the wart does not resolve with use of over-the-counter Compound W I recommend that you make an appointment with a dermatologist.  I have given you the contact information for [facility].     ED Prescriptions   None    PDMP not reviewed this encounter.   Becky Augusta, NP 09/08/23 [phone]

## 2023-09-08 NOTE — ED Triage Notes (Signed)
Patient reports a area on there left thumb that is painful and tender to the touch for almost 2 months.  Patient not sure if something is stuck in there.  Patient denies drainage from the site.

## 2024-04-21 ENCOUNTER — Other Ambulatory Visit: Payer: Self-pay

## 2024-04-21 ENCOUNTER — Emergency Department
Admission: EM | Admit: 2024-04-21 | Discharge: 2024-04-21 | Disposition: A | Discharge status: Home / Self Care | Payer: MEDICAID | Attending: Emergency Medicine | Admitting: Emergency Medicine

## 2024-04-21 ENCOUNTER — Emergency Department: Payer: MEDICAID

## 2024-04-21 DIAGNOSIS — K469 Unspecified abdominal hernia without obstruction or gangrene: Secondary | ICD-10-CM | POA: Diagnosis not present

## 2024-04-21 DIAGNOSIS — R1084 Generalized abdominal pain: Secondary | ICD-10-CM

## 2024-04-21 DIAGNOSIS — K5939 Other megacolon: Secondary | ICD-10-CM | POA: Insufficient documentation

## 2024-04-21 DIAGNOSIS — R112 Nausea with vomiting, unspecified: Secondary | ICD-10-CM

## 2024-04-21 LAB — CBC WITH DIFFERENTIAL/PLATELET
Abs Immature Granulocytes: 0.02 K/uL (ref 0.00–0.07)
Basophils Absolute: 0.1 K/uL (ref 0.0–0.1)
Basophils Relative: 1 %
Eosinophils Absolute: 0.3 K/uL (ref 0.0–0.5)
Eosinophils Relative: 4 %
HCT: 40.6 % (ref 36.0–46.0)
Hemoglobin: 14 g/dL (ref 12.0–15.0)
Immature Granulocytes: 0 %
Lymphocytes Relative: 32 %
Lymphs Abs: 2.8 K/uL (ref 0.7–4.0)
MCH: 29.1 pg (ref 26.0–34.0)
MCHC: 34.5 g/dL (ref 30.0–36.0)
MCV: 84.4 fL (ref 80.0–100.0)
Monocytes Absolute: 0.8 K/uL (ref 0.1–1.0)
Monocytes Relative: 9 %
Neutro Abs: 4.7 K/uL (ref 1.7–7.7)
Neutrophils Relative %: 54 %
Platelets: 255 K/uL (ref 150–400)
RBC: 4.81 MIL/uL (ref 3.87–5.11)
RDW: 13.2 % (ref 11.5–15.5)
WBC: 8.7 K/uL (ref 4.0–10.5)
nRBC: 0 % (ref 0.0–0.2)

## 2024-04-21 LAB — COMPREHENSIVE METABOLIC PANEL WITH GFR
ALT: 33 U/L (ref 0–44)
AST: 41 U/L (ref 15–41)
Albumin: 4 g/dL (ref 3.5–5.0)
Alkaline Phosphatase: 60 U/L (ref 38–126)
Anion gap: 10 (ref 5–15)
BUN: 7 mg/dL (ref 6–20)
CO2: 20 mmol/L — ABNORMAL LOW (ref 22–32)
Calcium: 9.5 mg/dL (ref 8.9–10.3)
Chloride: 105 mmol/L (ref 98–111)
Creatinine, Ser: 0.5 mg/dL (ref 0.44–1.00)
GFR, Estimated: >60 mL/min (ref >60)
Glucose, Bld: 122 mg/dL — ABNORMAL HIGH (ref 70–99)
Potassium: 3.4 mmol/L — ABNORMAL LOW (ref 3.5–5.1)
Sodium: 135 mmol/L (ref 135–145)
Total Bilirubin: 0.4 mg/dL (ref 0.0–1.2)
Total Protein: 8.2 g/dL — ABNORMAL HIGH (ref 6.5–8.1)

## 2024-04-21 LAB — URINALYSIS, ROUTINE W REFLEX MICROSCOPIC
Bilirubin Urine: NEGATIVE
Glucose, UA: NEGATIVE mg/dL
Hgb urine dipstick: NEGATIVE
Ketones, ur: NEGATIVE mg/dL
Leukocytes,Ua: NEGATIVE
Nitrite: NEGATIVE
Protein, ur: NEGATIVE mg/dL
Specific Gravity, Urine: 1.003 — ABNORMAL LOW (ref 1.005–1.030)
pH: 6 (ref 5.0–8.0)

## 2024-04-21 LAB — HCG, QUANTITATIVE, PREGNANCY: hCG, Beta Chain, Quant, S: <1 m[IU]/mL (ref <5)

## 2024-04-21 LAB — LIPASE, BLOOD: Lipase: 27 U/L (ref 11–51)

## 2024-04-21 MED ORDER — SODIUM CHLORIDE 0.9 % IV BOLUS (SEPSIS)
1000.0000 mL | Freq: Once | INTRAVENOUS | Status: AC
  Administered 2024-04-21: 1000 mL via INTRAVENOUS

## 2024-04-21 MED ORDER — ONDANSETRON HCL 4 MG/2ML IJ SOLN
4.0000 mg | Freq: Once | INTRAMUSCULAR | Status: AC
  Administered 2024-04-21: 4 mg via INTRAVENOUS
  Filled 2024-04-21: qty 2

## 2024-04-21 MED ORDER — ONDANSETRON 4 MG PO TBDP: 4.0000 mg | ORAL_TABLET | Freq: Four times a day (QID) | ORAL | 0 refills | Status: AC | PRN

## 2024-04-21 MED ORDER — KETOROLAC TROMETHAMINE 30 MG/ML IJ SOLN
30.0000 mg | Freq: Once | INTRAMUSCULAR | Status: AC
  Administered 2024-04-21: 30 mg via INTRAVENOUS
  Filled 2024-04-21: qty 1

## 2024-04-21 MED ORDER — SIMETHICONE 80 MG PO CHEW
80.0000 mg | CHEWABLE_TABLET | Freq: Once | ORAL | Status: AC
  Administered 2024-04-21: 80 mg via ORAL
  Filled 2024-04-21: qty 1

## 2024-04-21 MED ORDER — SIMETHICONE 80 MG PO CHEW: 80.0000 mg | CHEWABLE_TABLET | Freq: Four times a day (QID) | ORAL | 0 refills | Status: AC | PRN

## 2024-04-21 MED ORDER — DICYCLOMINE HCL 20 MG PO TABS
20.0000 mg | ORAL_TABLET | Freq: Three times a day (TID) | ORAL | 0 refills | Status: AC | PRN
Start: 2024-04-21 — End: ?

## 2024-04-21 MED ORDER — IOHEXOL 300 MG/ML  SOLN
100.0000 mL | Freq: Once | INTRAMUSCULAR | Status: AC | PRN
  Administered 2024-04-21: 100 mL via INTRAVENOUS

## 2024-04-21 NOTE — ED Provider Notes (Signed)
 [facility] Provider Note    Event Date/Time   First MD Initiated Contact with Patient 04/21/24 [phone]     (approximate)   History   Abdominal Pain   HPI  [name] is a 38 y.o. female with history of schizophrenia, gallstones, bowel obstruction, prior C-section, with history of ventral hernia repair in 2017, incisional hernia repair in 2018, prior ERCP who presents to the emergency department complaining of generalized abdominal pain.  States she has had problems with abdominal pain on and off for the past year that she attributes to her hernia.  She states that pain seemed to worsen today and she did vomit once.  She states that the main reason she came to the emergency department today was because someone was watching her children and she could finally come and get evaluated.  No fevers, diarrhea.  She is passing gas.  No dysuria, hematuria, vaginal bleeding or discharge.   History provided by patient, EMS.    Past Medical History:  Diagnosis Date   Abdominal pain affecting pregnancy, antepartum 02/28/2016   Anemia    LAST HGB 04-03-17 12.5   Anxiety    Depression    Gallstones 2018   Incisional ventral hernia w obstruction    Obesity 09-13-2013   Last Assessment & Plan:  Obesity in pregnancy is associated with maternal and obstetric complications including increased risk of gestational DM, gestational HTN, preeclampsia, thromboembolism, failed induction of labor, Cesarean section, excessive gestational weight gain and post-partum weight retention. Obesity is also associated with fetal complications including prematurity, stillbirth, congen   Post-operative state 06/13/2016   Schizophrenia (HCC)    SIDS (sudden infant death syndrome) 13-Sep-2013   Last Assessment & Plan:  Ms. Swint third son died at three months of age from SIDS. I reviewed with Ms. Laker that the cause of SIDS is unknown. I did review with her that placing infants to sleep on  their backs without soft bedding and tobacco cessation are potentially modifiable risk factors to decrease her risk of SIDS.   Small bowel obstruction [facility])    Supervision of high risk pregnancy in third trimester 09/13/2013   Last Assessment & Plan:  Ms. Feltman is receiving her care at [facility]. She reports having an appointment tomorrow 12/3. I encouraged her to get a flu and TDAP vaccination. She is due for her CBC, RPR, third trimester HIV and glucola.    Tobacco abuse 09/13/2013   Last Assessment & Plan:  I discussed with Ms. Vensel that the risks of smoking in pregnancy include spontaneous pregnancy loss, placental abruption, preterm premature rupture of membranes (PPROM), placenta previa, preterm labor and delivery, and low birth weight (LBW) as well as SIDS. I discussed with her that her tobacco use is a modifiable risk factor for risk of placental abruption and SIDS, b    Past Surgical History:  Procedure Laterality Date   CESAREAN SECTION N/A 06/12/2016   Procedure: STAT CESAREAN SECTION WITH BILATERAL STERILIZATION;  Surgeon: Debby JINNY Dinsmore, MD;  Location: [facility] ORS;  Service: Obstetrics;  Laterality: N/A;  Baby Girl @ 2246 Weight 5 lb 10 oz Apgars 8/9   ENDOSCOPIC RETROGRADE CHOLANGIOPANCREATOGRAPHY (ERCP) WITH PROPOFOL  N/A 06/06/2017   Procedure: ENDOSCOPIC RETROGRADE CHOLANGIOPANCREATOGRAPHY (ERCP) WITH PROPOFOL ;  Surgeon: Jinny Carmine, MD;  Location: [facility] ENDOSCOPY;  Service: Endoscopy;  Laterality: N/A;   ERCP N/A 10/30/2017   Procedure: ENDOSCOPIC RETROGRADE CHOLANGIOPANCREATOGRAPHY (ERCP) STENT REMOVAL;  Surgeon: Jinny Carmine, MD;  Location: [facility] ENDOSCOPY;  Service: Endoscopy;  Laterality: N/A;   INCISIONAL HERNIA REPAIR N/A 05/07/2017   Procedure: LAPAROSCOPIC INCISIONAL HERNIA;  Surgeon: Desiderio Schanz, MD;  Location: [facility] ORS;  Service: General;  Laterality: N/A;   TUBAL LIGATION     VENTRAL HERNIA REPAIR N/A 06/19/2016   Procedure: HERNIA REPAIR VENTRAL ADULT;   Surgeon: Charlie FORBES Fell, MD;  Location: [facility] ORS;  Service: General;  Laterality: N/A;    MEDICATIONS:  Prior to Admission medications   Medication Sig Start Date End Date Taking? Authorizing Provider  traZODone  (DESYREL ) 50 MG tablet Take 1 tablet (50 mg total) by mouth at bedtime as needed for sleep. 09/04/19   Kendall Cathlyn Collum, MD    Physical Exam   Triage Vital Signs: ED Triage Vitals  Encounter Vitals Group     BP 04/21/24 0059 (!) 141/118     Girls Systolic BP Percentile --      Girls Diastolic BP Percentile --      Boys Systolic BP Percentile --      Boys Diastolic BP Percentile --      Pulse Rate 04/21/24 0059 99     Resp 04/21/24 0059 16     Temp 04/21/24 0059 98.8 F (37.1 C)     Temp Source 04/21/24 0059 Oral     SpO2 04/21/24 0059 99 %     Weight 04/21/24 0056 280 lb (127 kg)     Height 04/21/24 0056 5' 4 (1.626 m)     Head Circumference --      Peak Flow --      Pain Score 04/21/24 0056 0     Pain Loc --      Pain Education --      Exclude from Growth Chart --     Most recent vital signs: Vitals:   04/21/24 0401 04/21/24 0409  BP:  (!) 111/52  Pulse: 100 100  Resp:  18  Temp:  98.9 F (37.2 C)  SpO2: 99% 99%    CONSTITUTIONAL: Alert, responds appropriately to questions.  Chronically ill-appearing, obese HEAD: Normocephalic, atraumatic EYES: Conjunctivae clear, pupils appear equal, sclera nonicteric ENT: normal nose; moist mucous membranes NECK: Supple, normal ROM CARD: RRR; S1 and S2 appreciated RESP: Normal chest excursion without splinting or tachypnea; breath sounds clear and equal bilaterally; no wheezes, no rhonchi, no rales, no hypoxia or respiratory distress, speaking full sentences ABD/GI: Non-distended; soft, tender to palpation diffusely, no hernias appreciated, no tympany, no guarding or rebound BACK: The back appears normal EXT: Normal ROM in all joints; no deformity noted, no edema SKIN: Normal color for age and race; warm; no  rash on exposed skin NEURO: Moves all extremities equally, normal speech PSYCH: Slightly odd affect.  Not responding to internal stimuli.   ED Results / Procedures / Treatments   LABS: (all labs ordered are listed, but only abnormal results are displayed) Labs Reviewed  COMPREHENSIVE METABOLIC PANEL WITH GFR - Abnormal; Notable for the following components:      Result Value   Potassium 3.4 (*)    CO2 20 (*)    Glucose, Bld 122 (*)    Total Protein 8.2 (*)    All other components within normal limits  URINALYSIS, ROUTINE W REFLEX MICROSCOPIC - Abnormal; Notable for the following components:   Color, Urine STRAW (*)    APPearance CLEAR (*)    Specific Gravity, Urine 1.003 (*)    All other components within normal limits  CBC WITH DIFFERENTIAL/PLATELET  LIPASE, BLOOD  HCG,  QUANTITATIVE, PREGNANCY     EKG:   RADIOLOGY: My personal review and interpretation of imaging: CT shows no incarcerated hernia, bowel obstruction.  I have personally reviewed all radiology reports.   CT ABDOMEN PELVIS W CONTRAST Result Date: 04/21/2024 CLINICAL DATA:  Abdominal pain.  History of ventral hernia repair. EXAM: CT ABDOMEN AND PELVIS WITH CONTRAST TECHNIQUE: Multidetector CT imaging of the abdomen and pelvis was performed using the standard protocol following bolus administration of intravenous contrast. RADIATION DOSE REDUCTION: This exam was performed according to the departmental dose-optimization program which includes automated exposure control, adjustment of the mA and/or kV according to patient size and/or use of iterative reconstruction technique. CONTRAST:  OMNIPAQUE  IOHEXOL  300 MG/ML  SOLN COMPARISON:  CT with IV and oral contrast 06/05/2017. FINDINGS: Lower chest: There is artifact due to respiratory motion. There is mosaicism in the lower lung fields which could be motion artifact or air trapping with small airways disease. No focal consolidation is seen.  The cardiac size is normal.  Hepatobiliary: The liver is 18 cm length mildly steatotic. There is loss of fine detail due to respiratory motion. No obvious mass is evident. Gallbladder is absent, with no biliary dilatation. Pancreas: No abnormality. Spleen: No abnormality.  No splenomegaly. Adrenals/Urinary Tract: Adrenal glands are unremarkable. Kidneys are normal, without renal calculi, focal lesion, or hydronephrosis. Bladder is unremarkable when allowing for partial distention. Stomach/Bowel: Unremarkable gastric wall. Normal caliber unopacified small bowel. Normal appendix. There is a mobile cecum. Today the cecum is dilated up to 7.2 cm and located in the anterior upper abdomen, above the transverse colon with a portion in between the liver and abdominal wall. There is no definite twisting of the cecum, but this does predispose to cecal volvulus. There is no colonic wall thickening or pneumatosis. Rest of the colon is normal in location. Scattered uncomplicated sigmoid diverticulosis. Vascular/Lymphatic: Age advanced aortic atherosclerosis. No enlarged abdominal or pelvic lymph nodes. Reproductive: Uterus and bilateral adnexa are unremarkable. Other: There is a large abdominopelvic ventral hernia repair. There is a mild outward protrusion in the midline along the pelvic wall repair, but no incarcerated bowel although at least 2 bowel loops do extend into the protrusion. There is no evidence of failure of the hernia repair. There is no free fluid, free hemorrhage or focal inflammatory process. Musculoskeletal: No acute or significant osseous findings. IMPRESSION: 1. Mobile cecum which is dilated up to 7.2 cm and located in the anterior upper abdomen, above the transverse colon with a portion in between the liver and abdominal wall. There is no definite twisting of the cecum, but this does predispose to cecal volvulus. 2. No colonic wall thickening or pneumatosis. 3. Large abdominopelvic ventral hernia repair with mild outward protrusion in  the midline along the pelvic wall repair, but no incarcerated bowel although at least 2 bowel loops do extend into the protrusion. 4. Age advanced aortic atherosclerosis. 5. Mosaic attenuation in the lower lung fields which could be motion artifact or air trapping with small airways disease. 6. Mild hepatic steatosis. Aortic Atherosclerosis (ICD10-I70.0). Electronically Signed   By: Francis Quam M.D.   On: 04/21/2024 02:53     PROCEDURES:  Critical Care performed: No      Procedures    IMPRESSION / MDM / ASSESSMENT AND PLAN / ED COURSE  I reviewed the triage vital signs and the nursing notes.    Patient here with abdominal pain, vomiting.     DIFFERENTIAL DIAGNOSIS (includes but not limited to):  Chronic abdominal pain, pain from hernia, strangulated or incarcerated hernia, bowel obstruction, gallstones, cholecystitis, pancreatitis, choledocholithiasis, appendicitis, UTI   Patient's presentation is most consistent with acute presentation with potential threat to life or bodily function.   PLAN: Will obtain labs, urine, CT of the abdomen pelvis.  Will give Toradol , Zofran , IV fluids for symptomatic relief.   MEDICATIONS GIVEN IN ED: Medications  ketorolac  (TORADOL ) 30 MG/ML injection 30 mg (30 mg Intravenous Given 04/21/24 0143)  ondansetron  (ZOFRAN ) injection 4 mg (4 mg Intravenous Given 04/21/24 0142)  sodium chloride  0.9 % bolus 1,000 mL (0 mLs Intravenous Stopped 04/21/24 0212)  iohexol  (OMNIPAQUE ) 300 MG/ML solution 100 mL (100 mLs Intravenous Contrast Given 04/21/24 0219)  simethicone  (MYLICON) chewable tablet 80 mg (80 mg Oral Given 04/21/24 0353)     ED COURSE: Patient's labs show no leukocytosis.  Normal electrolytes, creatinine, LFTs and lipase.  Pregnancy test negative.  Urine shows no sign of infection.  CT scan reviewed and interpreted by myself and the radiologist and shows large abdominal pelvic ventral hernia repair with outward protrusion in the midline  along the pelvic wall repair but there is no incarcerated bowel.  No bowel obstruction.  She does have a mobile cecum which is dilated without definite twisting.  No obvious volvulus.  Will discuss the case with Dr. Tye with general surgery.   CONSULTS: Discussed with Dr. Tye with general surgery who has reviewed patient's case and imaging.  He recommends giving a dose of simethicone  for the increased gas seen in her cecum and attempting a p.o. challenge.  If patient does well after medication and tolerates p.o., she can follow-up with Dr. Desiderio as an outpatient.  If she continues to have pain or vomiting, he recommends admission to the hospitalist service and general surgery will consult.   On reevaluation, patient has been able to eat graham crackers and drink 8 ounces of ginger ale.  She reports she is not having pain and has not had any further vomiting.  She states she is feeling better and is comfortable with plan for discharge home.  Discussed these findings with patient but also put them in her discharge paperwork and encouraged her to follow-up with Dr. Desiderio.   At this time, I do not feel there is any life-threatening condition present. I reviewed all nursing notes, vitals, pertinent previous records.  All lab and urine results, EKGs, imaging ordered have been independently reviewed and interpreted by myself.  I reviewed all available radiology reports from any imaging ordered this visit.  Based on my assessment, I feel the patient is safe to be discharged home without further emergent workup and can continue workup as an outpatient as needed. Discussed all findings, treatment plan as well as usual and customary return precautions.  They verbalize understanding and are comfortable with this plan.  Outpatient follow-up has been provided as needed.  All questions have been answered.    OUTSIDE RECORDS REVIEWED: Reviewed previous surgical notes.       FINAL CLINICAL IMPRESSION(S) /  ED DIAGNOSES   Final diagnoses:  Generalized abdominal pain  Nausea and vomiting in adult  Abdominal hernia without obstruction and without gangrene, recurrence not specified, unspecified hernia type  Dilation of cecum     Rx / DC Orders   ED Discharge Orders          Ordered    simethicone  (MYLICON) 80 MG chewable tablet  Every 6 hours PRN        04/21/24 0419  dicyclomine  (BENTYL ) 20 MG tablet  Every 8 hours PRN        04/21/24 0419    ondansetron  (ZOFRAN -ODT) 4 MG disintegrating tablet  Every 6 hours PRN        04/21/24 0419             Note:  This document was prepared using Dragon voice recognition software and may include unintentional dictation errors.   Emrik Erhard, Josette SAILOR, DO 04/21/24 [phone]

## 2024-04-21 NOTE — ED Triage Notes (Signed)
 Patient to ED via [facility] from home. Patient complaining of abdominal pain that started last year but has been worsening. Patient states she has 2 hernias. The hernia on the left side has been bothering her today so she wanted to be seen in the ED. Patient denies N/V/D today. Patient ambulatory.with steady gait. Patient A&Ox4.

## 2024-04-21 NOTE — ED Notes (Addendum)
 Pt ate 2 packages of graham crackers. PO challenge passed.

## 2024-04-21 NOTE — Discharge Instructions (Addendum)
 Your CT scan today showed no acute abnormality with your abdominal hernia.  Your CT did show that part of your intestines called your cecum was full of gas and mobile which can put you at risk for a condition called a cecal volvulus which can cause severe abdominal pain, vomiting, abdominal distention.  If you were to develop any of the symptoms you need to return to the emergency department immediately.  This can be followed up by Dr. Desiderio as an outpatient as they may elect to do a surgery called a cecopexy to tack down the bowel so it can not move or twist.  This does not need to be done emergently today.  We did discuss your case with the general surgeon on-call who reviewed your case and imaging.  Given you are feeling better, your lab work was reassuring, you have normal vital signs and are able to eat and drink without return of pain or vomiting, we feel you are safe to go home and follow-up with Dr. Desiderio as an outpatient.  Please call to schedule an appointment.  1. Mobile cecum which is dilated up to 7.2 cm and located in the  anterior upper abdomen, above the transverse colon with a portion in  between the liver and abdominal wall. 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

## 2024-04-21 NOTE — ED Notes (Signed)
Pt given ginger ale and graham crackers.

## 2024-04-28 ENCOUNTER — Encounter: Payer: Self-pay | Admitting: Surgery

## 2024-04-28 ENCOUNTER — Ambulatory Visit: Payer: MEDICAID | Admitting: Surgery

## 2024-04-28 ENCOUNTER — Telehealth: Payer: Self-pay | Admitting: *Deleted

## 2024-04-28 VITALS — BP 135/91 | HR 90 | Temp 97.9°F | Ht 64.0 in | Wt 289.2 lb

## 2024-04-28 DIAGNOSIS — Q433 Congenital malformations of intestinal fixation: Secondary | ICD-10-CM | POA: Diagnosis not present

## 2024-04-28 DIAGNOSIS — K8020 Calculus of gallbladder without cholecystitis without obstruction: Secondary | ICD-10-CM | POA: Diagnosis not present

## 2024-04-28 MED ORDER — POLYETHYLENE GLYCOL 3350 17 GM/SCOOP PO POWD
238.0000 g | Freq: Once | ORAL | 0 refills | Status: AC
Start: 2024-04-28 — End: 2024-04-28

## 2024-04-28 MED ORDER — METRONIDAZOLE 500 MG PO TABS
ORAL_TABLET | ORAL | 0 refills | Status: AC
Start: 2024-04-28 — End: ?

## 2024-04-28 MED ORDER — NEOMYCIN SULFATE 500 MG PO TABS: ORAL_TABLET | ORAL | 0 refills | Status: AC

## 2024-04-28 NOTE — Telephone Encounter (Signed)
 Faxed Medical Clearance to [facility] at [phone]

## 2024-04-28 NOTE — Patient Instructions (Addendum)
 Please pick up your prescriptions at the pharmacy [facility] on [address])- Please go over the blue sheet for your instructions to prepare for surgery, if you have any questions or concerns please call the office.    We have discussed removing a portion of your damaged colon through 4 small incisions today. We will schedule this surgery at [facility] with Dr. Desiderio. Please plan a hospital stay of 3-7 days for surgery and recovery time.  If you are on any injectable weight loss medication, you will need to stop taking your GLP-1 injectable (weight loss) medications 8 days before your surgery to avoid any complications with anesthesia.   To prep for your surgery, You will need to complete a bowel prep and take 2 antibiotics. Your antibiotics are Neomycin  and Metronidazole  and these will be taken at 8am, 2pm, 8pm on the day of your prep. (Please see the Colon Surgery Prep sheet provided) Gave patient blue sheet on 04/28/24  You have also been given a (Blue) Pre-Care Sheet with more information regarding your particular surgery. Our surgery scheduler will call you to verify surgery date and to go over information.  Please review all information given.  You will need to arrange to be out of work for approximately 2 weeks and then you may return with a lifting restriction for 4 more weeks. If you have FMLA or Disability paperwork that needs to be filled out, please have your company fax your paperwork to [phone] or you may drop this by either office. This paperwork will be filled out within 3 days after your surgery has been completed.  Please call our office with any questions or concerns prior to your scheduled surgery.   Laparoscopic Colectomy Laparoscopic colectomy is surgery to remove part or all of the large intestine (colon). This procedure may be used to treat several conditions, including: Inflammation and infection of the colon (diverticulitis). Tumors or masses in the  colon. Inflammatory bowel disease, such as Crohn disease or ulcerative colitis. Colectomy is an option when symptoms cannot be controlled with medicines. Bleeding from the colon that cannot be controlled by another method. Blockage or obstruction of the colon.  Tell a health care provider about: Any allergies you have. All medicines you are taking, including vitamins, herbs, eye drops, creams, and over-the-counter medicines. Any problems you or family members have had with anesthetic medicines. Any blood disorders you have. Any surgeries you have had. Any medical conditions you have. What are the risks? Generally, this is a safe procedure. However, problems may occur, including: Infection. Bleeding. Allergic reactions to medicines or dyes. Damage to other structures or organs. Leaking from where the colon was sewn together. Future blockage of the small intestines from scar tissue. Another surgery may be needed to repair this. Needing to convert to an open procedure. Complications such as damage to other organs or excessive bleeding may require the surgeon to convert from a laparoscopic procedure to an open procedure. This involves making a larger incision in the abdomen.  What happens before the procedure?  Medicines Ask your health care provider about: Changing or stopping your regular medicines. This is especially important if you are taking diabetes medicines or blood thinners. Taking medicines such as aspirin and ibuprofen . These medicines can thin your blood. Do not take these medicines before your procedure if your health care provider instructs you not to. You may be given antibiotic medicine to clean out bacteria from your colon. Follow the directions carefully and take the  medicine at the correct time. General instructions You may be prescribed an oral bowel prep to clean out your colon in preparation for the surgery: Follow instructions from your health care provider about  how to do this. Do not eat or drink anything else after you have started the bowel prep, unless your health care provider tells you it is safe to do so. Do not use any products that contain nicotine  or tobacco, such as cigarettes and e-cigarettes. If you need help quitting, ask your health care provider. What happens during the procedure? To reduce your risk of infection: Your health care team will wash or sanitize their hands. Your skin will be washed with soap. An IV tube will be inserted into one of your veins to deliver fluid and medication. You will be given one of the following: A medicine to help you relax (sedative). A medicine to make you fall asleep (general anesthetic). Small monitors will be connected to your body. They will be used to check your heart, blood pressure, and oxygen level. A breathing tube may be placed into your lungs during the procedure. A thin, flexible tube (catheter) will be placed into your bladder to drain urine. A tube may be placed through your nose and into your stomach to drain stomach fluids (nasogastric tube, or NG tube). Your abdomen will be filled with air so it expands. This gives the surgeon more room to operate and makes your organs easier to see. Several small cuts (incisions) will be made in your abdomen. A thin, lighted tube with a tiny camera on the end (laparoscope) will be put through one of the small incisions. The camera on the laparoscope will send a picture to a computer screen in the operating room. This will give the surgeon a good view inside your abdomen. Hollow tubes will be put through the other small incisions in your abdomen. The tools that are needed for the procedure will be put through these tubes. Clamps or staples will be put on both ends of the diseased part of the colon. The part of the intestine between the clamps or staples will be removed. If possible, the ends of the healthy colon that remain will be stitched (sutured) or  stapled together to allow your body to pass waste (stool). Sometimes, the remaining colon cannot be stitched back together. If this is the case, a colostomy will be needed. If you need a colostomy: An opening to the outside of your body (stoma) will be made through your abdomen. The end of your colon will be brought to the opening. It will be stitched to the skin. A bag will be attached to the opening. Stool will drain into this removable bag. The colostomy may be temporary or permanent. The incisions from the colectomy will be closed with sutures or staples. The procedure may vary among health care providers and hospitals. What happens after the procedure? Your blood pressure, heart rate, breathing rate, and blood oxygen level will be monitored until the medicines you were given have worn off. You will receive fluids through an IV tube until your bowels start to work properly. Once your bowels are working again, you will be given clear liquids first and then solid food as tolerated. You will be given medicines to control your pain and nausea, if needed. Do not drive for 24 hours if you were given a sedative. This information is not intended to replace advice given to you by your health care provider. Make sure you discuss  any questions you have with your health care provider. Document Released: 12/16/2002 Document Revised: 06/26/2016 Document Reviewed: 06/26/2016 Elsevier Interactive Patient Education  2018 [employer].     Laparoscopic Colectomy, Care After This sheet gives you information about how to care for yourself after your procedure. Your health care provider may also give you more specific instructions. If you have problems or questions, contact your health care provider. What can I expect after the procedure? After your procedure, it is common to have the following: Pain in your abdomen, especially in the incision areas. You will be given medicine to control the  pain. Tiredness. This is a normal part of the recovery process. Your energy level will return to normal over the next several weeks. Changes in your bowel movements, such as constipation or needing to go more often. Talk with your health care provider about how to manage this.  Follow these instructions at home: Medicines Take over-the-counter and prescription medicines only as told by your health care provider. Do not drive or use heavy machinery while taking prescription pain medicine. Do not drink alcohol while taking prescription pain medicine. If you were prescribed an antibiotic medicine, use it as told by your health care provider. Do not stop using the antibiotic even if you start to feel better. Incision care Follow instructions from your health care provider about how to take care of your incision areas. Make sure you: Keep your incisions clean and dry. Wash your hands with soap and water before and after applying medicine to the areas, and before and after changing your bandage (dressing). If soap and water are not available, use hand sanitizer. Change your dressing as told by your health care provider. Leave stitches (sutures), skin glue, or adhesive strips in place. These skin closures may need to stay in place for 2 weeks or longer. If adhesive strip edges start to loosen and curl up, you may trim the loose edges. Do not remove adhesive strips completely unless your health care provider tells you to do that. Do not wear tight clothing over the incisions. Tight clothing may rub and irritate the incision areas, which may cause the incisions to open. Do not take baths, swim, or use a hot tub until your health care provider approves. Ask your health care provider if you can take showers. You may only be allowed to take sponge baths for bathing. Check your incision area every day for signs of infection. Check for: More redness, swelling, or pain. More fluid or blood. Warmth. Pus or a  bad smell. Activity Avoid lifting anything that is heavier than 10 lb (4.5 kg) for 2 weeks or until your health care provider says it is okay. You may resume normal activities as told by your health care provider. Ask your health care provider what activities are safe for you. Take rest breaks during the day as needed. Eating and drinking Follow instructions from your health care provider about what you can eat after surgery. To prevent or treat constipation while you are taking prescription pain medicine, your health care provider may recommend that you: Drink enough fluid to keep your urine clear or pale yellow. Take over-the-counter or prescription medicines. Eat foods that are high in fiber, such as fresh fruits and vegetables, whole grains, and beans. Limit foods that are high in fat and processed sugars, such as fried and sweet foods. General instructions Ask your health care provider when you will need an appointment to get your sutures or staples removed.  Keep all follow-up visits as told by your health care provider. This is important. Contact a health care provider if: You have more redness, swelling, or pain around your incisions. You have more fluid or blood coming from the incisions. Your incisions feel warm to the touch. You have pus or a bad smell coming from your incisions or your dressing. You have a fever. You have an incision that breaks open (edges not staying together) after sutures or staples have been removed. Get help right away if: You develop a rash. You have chest pain or difficulty breathing. You have pain or swelling in your legs. You feel light-headed or you faint. Your abdomen swells (becomes distended). You have nausea or vomiting. You have blood in your stool (feces). This information is not intended to replace advice given to you by your health care provider. Make sure you discuss any questions you have with your health care provider. Document  Released: 04/14/2005 Document Revised: 06/26/2016 Document Reviewed: 06/26/2016 Elsevier Interactive Patient Education  2018 [employer].         You have requested to have your gallbladder removed. This will be done at [facility] with Dr. Desiderio.  If you are on any injectable weight loss medication, you will need to stop taking your GLP-1 injectable (weight loss) medications 8 days before your surgery to avoid any complications with anesthesia.   You will most likely be out of work 1-2 weeks for this surgery.  If you have FMLA or disability paperwork that needs filled out you may drop this off at our office or this can be faxed to [phone].  You will return after your post-op appointment with a lifting restriction for approximately 4 more weeks.  You will be able to eat anything you would like to following surgery. But, start by eating a bland diet and advance this as tolerated. The Gallbladder diet is below, please go as closely by this diet as possible prior to surgery to avoid any further attacks.  Please see the (blue)pre-care form that you have been given today. Our surgery scheduler will call you to verify surgery date and to go over information.   If you have any questions, please call our office.  Laparoscopic Cholecystectomy Laparoscopic cholecystectomy is surgery to remove the gallbladder. The gallbladder is located in the upper right part of the abdomen, behind the liver. It is a storage sac for bile, which is produced in the liver. Bile aids in the digestion and absorption of fats. Cholecystectomy is often done for inflammation of the gallbladder (cholecystitis). This condition is usually caused by a buildup of gallstones (cholelithiasis) in the gallbladder. Gallstones can block the flow of bile, and that can result in inflammation and pain. In severe cases, emergency surgery may be required. If emergency surgery is not required, you will have time to prepare for  the procedure. Laparoscopic surgery is an alternative to open surgery. Laparoscopic surgery has a shorter recovery time. Your common bile duct may also need to be examined during the procedure. If stones are found in the common bile duct, they may be removed. LET [facility] CARE PROVIDER KNOW ABOUT: Any allergies you have. All medicines you are taking, including vitamins, herbs, eye drops, creams, and over-the-counter medicines. Previous problems you or members of your family have had with the use of anesthetics. Any blood disorders you have. Previous surgeries you have had.  Any medical conditions you have. RISKS AND COMPLICATIONS Generally, this is a safe procedure. However,  problems may occur, including: Infection. Bleeding. Allergic reactions to medicines. Damage to other structures or organs. A stone remaining in the common bile duct. A bile leak from the cyst duct that is clipped when your gallbladder is removed. The need to convert to open surgery, which requires a larger incision in the abdomen. This may be necessary if your surgeon thinks that it is not safe to continue with a laparoscopic procedure. BEFORE THE PROCEDURE Ask your health care provider about: Changing or stopping your regular medicines. This is especially important if you are taking diabetes medicines or blood thinners. Taking medicines such as aspirin and ibuprofen . These medicines can thin your blood. Do not take these medicines before your procedure if your health care provider instructs you not to. Follow instructions from your health care provider about eating or drinking restrictions. Let your health care provider know if you develop a cold or an infection before surgery. Plan to have someone take you home after the procedure. Ask your health care provider how your surgical site will be marked or identified. You may be given antibiotic medicine to help prevent infection. PROCEDURE To reduce your risk of  infection: Your health care team will wash or sanitize their hands. Your skin will be washed with soap. An IV tube may be inserted into one of your veins. You will be given a medicine to make you fall asleep (general anesthetic). A breathing tube will be placed in your mouth. The surgeon will make several small cuts (incisions) in your abdomen. A thin, lighted tube (laparoscope) that has a tiny camera on the end will be inserted through one of the small incisions. The camera on the laparoscope will send a picture to a TV screen (monitor) in the operating room. This will give the surgeon a good view inside your abdomen. A gas will be pumped into your abdomen. This will expand your abdomen to give the surgeon more room to perform the surgery. Other tools that are needed for the procedure will be inserted through the other incisions. The gallbladder will be removed through one of the incisions. After your gallbladder has been removed, the incisions will be closed with stitches (sutures), staples, or skin glue. Your incisions may be covered with a bandage (dressing). The procedure may vary among health care providers and hospitals. AFTER THE PROCEDURE Your blood pressure, heart rate, breathing rate, and blood oxygen level will be monitored often until the medicines you were given have worn off. You will be given medicines as needed to control your pain.   This information is not intended to replace advice given to you by your health care provider. Make sure you discuss any questions you have with your health care provider.   Document Released: 09/25/2005 Document Revised: 06/16/2015 Document Reviewed: 05/07/2013 Elsevier Interactive Patient Education 2016 Else[employer] Inc[employer]   Low-Fat Diet for Gallbladder Conditions A low-fat diet can be helpful if you have pancreatitis or a gallbladder condition. With these conditions, your pancreas and gallbladder have trouble digesting fats. A healthy eating plan  with less fat will help rest your pancreas and gallbladder and reduce your symptoms. WHAT DO I NEED TO KNOW ABOUT THIS DIET? Eat a low-fat diet. Reduce your fat intake to less than 20-30% of your total daily calories. This is less than 50-60 g of fat per day. Remember that you need some fat in your diet. Ask your dietician what your daily goal should be. Choose nonfat and low-fat healthy foods. Look for the words  nonfat, low fat, or fat free. As a guide, look on the label and choose foods with less than 3 g of fat per serving. Eat only one serving. Avoid alcohol. Do not smoke. If you need help quitting, talk with your health care provider. Eat small frequent meals instead of three large heavy meals. WHAT FOODS CAN I EAT? Grains Include healthy grains and starches such as potatoes, wheat bread, fiber-rich cereal, and brown rice. Choose whole grain options whenever possible. In adults, whole grains should account for 45-65% of your daily calories.  Fruits and Vegetables Eat plenty of fruits and vegetables. Fresh fruits and vegetables add fiber to your diet. Meats and Other Protein Sources Eat lean meat such as chicken and pork. Trim any fat off of meat before cooking it. Eggs, fish, and beans are other sources of protein. In adults, these foods should account for 10-35% of your daily calories. Dairy Choose low-fat milk and dairy options. Dairy includes fat and protein, as well as calcium.  Fats and Oils Limit high-fat foods such as fried foods, sweets, baked goods, sugary drinks.  Other Creamy sauces and condiments, such as mayonnaise, can add extra fat. Think about whether or not you need to use them, or use smaller amounts or low fat options. WHAT FOODS ARE NOT RECOMMENDED? High fat foods, such as: [employer]. Ice cream. [place] toast. Sweet rolls. Pizza. Cheese bread. Foods covered with batter, butter, creamy sauces, or cheese. Fried foods. Sugary drinks and desserts. Foods  that cause gas or bloating   This information is not intended to replace advice given to you by your health care provider. Make sure you discuss any questions you have with your health care provider.   Document Released: 09/30/2013 Document Reviewed: 09/30/2013 Elsevier Interactive Patient Education [employer]

## 2024-04-28 NOTE — Progress Notes (Signed)
 04/28/2024  Reason for Visit:  Abdominal pain  History of Present Illness: [name] is a 38 y.o. female presenting for evaluation of abdominal pain.  She had a c-section on 06/12/2016 and had an incisional hernia causing bowel obstruction requiring emergent repair on 06/19/2016.  She later presented with a low midline incisional hernia and had a laparoscopic hernia repair with mesh on 05/07/2017.  She reports that about a year ago, she started having intermittent pain in the mid abdomen.  The pain is happening about twice per week.  This is associated with some mild spitting/emesis.  Denies any constipation, diarrhea, or blood in the stool.  Denies any association of the pain with activity level or with po intake.    She presented to the ED on 04/21/24 for this and she had a CT scan of abdomen/pelvis.  This did not show a hernia, although the inferior midline portion has a mild invagination of the mesh, but there is no true hernia.  There is also finding of a mobile cecum which was dilated to 7.2 cm and located in the anterior upper abdomen above the transverse colon.    Of note, the patient also has a history of biliary stricture and choledocholithiasis (pathology negative, tumor markers normal), and is s/p ERCP on 06/06/17 with stent placement for stricture, and later on had ERCP on 10/30/17 for stent removal and clearing of choledocholithiasis.  Past Medical History: Past Medical History:  Diagnosis Date   Abdominal pain affecting pregnancy, antepartum 02/28/2016   Anemia    LAST HGB 04-03-17 12.5   Anxiety    Depression    Gallstones 2018   Incisional ventral hernia w obstruction    Obesity 09/26/13   Last Assessment & Plan:  Obesity in pregnancy is associated with maternal and obstetric complications including increased risk of gestational DM, gestational HTN, preeclampsia, thromboembolism, failed induction of labor, Cesarean section, excessive gestational weight gain and post-partum  weight retention. Obesity is also associated with fetal complications including prematurity, stillbirth, congen   Post-operative state 06/13/2016   Schizophrenia (HCC)    SIDS (sudden infant death syndrome) 09-26-2013   Last Assessment & Plan:  Ms. Woolford third son died at three months of age from SIDS. I reviewed with Ms. Belleville that the cause of SIDS is unknown. I did review with her that placing infants to sleep on their backs without soft bedding and tobacco cessation are potentially modifiable risk factors to decrease her risk of SIDS.   Small bowel obstruction [facility])    Supervision of high risk pregnancy in third trimester 09-26-13   Last Assessment & Plan:  Ms. Capobianco is receiving her care at [facility]. She reports having an appointment tomorrow 12/3. I encouraged her to get a flu and TDAP vaccination. She is due for her CBC, RPR, third trimester HIV and glucola.    Tobacco abuse 2013-09-26   Last Assessment & Plan:  I discussed with Ms. Diver that the risks of smoking in pregnancy include spontaneous pregnancy loss, placental abruption, preterm premature rupture of membranes (PPROM), placenta previa, preterm labor and delivery, and low birth weight (LBW) as well as SIDS. I discussed with her that her tobacco use is a modifiable risk factor for risk of placental abruption and SIDS, b     Past Surgical History: Past Surgical History:  Procedure Laterality Date   CESAREAN SECTION N/A 06/12/2016   Procedure: STAT CESAREAN SECTION WITH BILATERAL STERILIZATION;  Surgeon: Debby JINNY Dinsmore, MD;  Location:  [facility] ORS;  Service: Obstetrics;  Laterality: N/A;  Baby Girl @ 2246 Weight 5 lb 10 oz Apgars 8/9   ENDOSCOPIC RETROGRADE CHOLANGIOPANCREATOGRAPHY (ERCP) WITH PROPOFOL  N/A 06/06/2017   Procedure: ENDOSCOPIC RETROGRADE CHOLANGIOPANCREATOGRAPHY (ERCP) WITH PROPOFOL ;  Surgeon: Jinny Carmine, MD;  Location: [facility] ENDOSCOPY;  Service: Endoscopy;  Laterality: N/A;   ERCP N/A 10/30/2017    Procedure: ENDOSCOPIC RETROGRADE CHOLANGIOPANCREATOGRAPHY (ERCP) STENT REMOVAL;  Surgeon: Jinny Carmine, MD;  Location: [facility] ENDOSCOPY;  Service: Endoscopy;  Laterality: N/A;   INCISIONAL HERNIA REPAIR N/A 05/07/2017   Procedure: LAPAROSCOPIC INCISIONAL HERNIA;  Surgeon: Desiderio Schanz, MD;  Location: [facility] ORS;  Service: General;  Laterality: N/A;   TUBAL LIGATION     VENTRAL HERNIA REPAIR N/A 06/19/2016   Procedure: HERNIA REPAIR VENTRAL ADULT;  Surgeon: Charlie FORBES Fell, MD;  Location: [facility] ORS;  Service: General;  Laterality: N/A;    Home Medications: Prior to Admission medications   Medication Sig Start Date End Date Taking? Authorizing Provider  metroNIDAZOLE  (FLAGYL ) 500 MG tablet Take 2 tabs (1000mg  total) at 8am, again at 2pm, and again at 8pm the day prior to surgery 04/28/24  Yes Jasmond River, MD  neomycin  (MYCIFRADIN ) 500 MG tablet Take 2 tabs (1000mg  total) at 8am, again at 2pm, and again at 8pm the day prior to surgery 04/28/24  Yes Nalini Alcaraz, MD  polyethylene glycol powder (MIRALAX ) 17 GM/SCOOP powder Take 238 g by mouth once for 1 dose. 04/28/24 04/28/24 Yes Gidget Quizhpi, MD  dicyclomine  (BENTYL ) 20 MG tablet Take 1 tablet (20 mg total) by mouth every 8 (eight) hours as needed. 04/21/24   Ward, Josette SAILOR, DO  ondansetron  (ZOFRAN -ODT) 4 MG disintegrating tablet Take 1 tablet (4 mg total) by mouth every 6 (six) hours as needed for nausea or vomiting. 04/21/24   Ward, Josette SAILOR, DO  simethicone  (MYLICON) 80 MG chewable tablet Chew 1 tablet (80 mg total) by mouth every 6 (six) hours as needed (abdominal pain, gas). 04/21/24   Ward, Josette SAILOR, DO  traZODone  (DESYREL ) 50 MG tablet Take 1 tablet (50 mg total) by mouth at bedtime as needed for sleep. 09/04/19   Kendall Cathlyn Collum, MD    Allergies: Allergies  Allergen Reactions   Amoxicillin     Vicodin [Hydrocodone -Acetaminophen ]     Social History:  reports that she has been smoking cigarettes. She has a 15 pack-year smoking history.  She has never used smokeless tobacco. She reports current alcohol use of about 7.0 standard drinks of alcohol per week. She reports current drug use.   Family History: Family History  Problem Relation Age of Onset   Diabetes Mother    Asthma Sister    Hypertension Maternal Aunt    Hypertension Maternal Uncle     Review of Systems: Review of Systems  Constitutional:  Negative for chills and fever.  HENT:  Negative for hearing loss.   Respiratory:  Negative for shortness of breath.   Cardiovascular:  Negative for chest pain.  Gastrointestinal:  Positive for abdominal pain and vomiting. Negative for blood in stool, constipation and nausea.  Genitourinary:  Negative for dysuria.  Musculoskeletal:  Negative for myalgias.  Skin:  Negative for rash.  Neurological:  Negative for dizziness.  Psychiatric/Behavioral:  Negative for depression.     Physical Exam BP (!) 135/91   Pulse 90   Temp 97.9 F (36.6 C) (Oral)   Ht 5' 4 (1.626 m)   Wt 289 lb 3.2 oz (131.2 kg)   SpO2 99%   BMI 49.64  kg/m  CONSTITUTIONAL: No acute distress HEENT:  Normocephalic, atraumatic, extraocular motion intact. NECK: Trachea is midline, and there is no jugular venous distension.  RESPIRATORY:  Lungs are clear, and breath sounds are equal bilaterally. Normal respiratory effort without pathologic use of accessory muscles. CARDIOVASCULAR: Heart is regular without murmurs, gallops, or rubs. GI: The abdomen is obese, soft, non-distended, currently non-tender.  The patient's low midline incision is well healed.  There is no evidence of hernia recurrence when coughing or straining, though may be difficult to discern with body habitus.  The patient points to an area just to the left and just superior to umbilicus for the location of her pain but there are no masses or hernia evidence in that location either.  MUSCULOSKELETAL:  Normal muscle strength and tone in all four extremities.  No peripheral edema or  cyanosis. SKIN: Skin turgor is normal. There are no pathologic skin lesions.  NEUROLOGIC:  Motor and sensation is grossly normal.  Cranial nerves are grossly intact. PSYCH:  Alert and oriented to person, place and time. Affect is normal.  Laboratory Analysis: Labs on 04/21/24: Na 135, K 3.4, Cl 105, CO2 20, BUN 7, Cr 0.5.  LFTs within normal.  WBC 8.7, Hgb 14, Hct 40.6, Plt 255  Imaging: CT abdomen/pelvis on 04/21/24: IMPRESSION: 1. Mobile cecum which is dilated up to 7.2 cm and located in the anterior upper abdomen, above the transverse colon with a portion in between the liver and abdominal wall. There is no definite twisting of the cecum, but this does predispose to cecal volvulus. 2. No colonic wall thickening or pneumatosis. 3. Large abdominopelvic ventral hernia repair with mild outward protrusion in the midline along the pelvic wall repair, but no incarcerated bowel although at least 2 bowel loops do extend into the protrusion. 4. Age advanced aortic atherosclerosis. 5. Mosaic attenuation in the lower lung fields which could be motion artifact or air trapping with small airways disease. 6. Mild hepatic steatosis.  Assessment and Plan: This is a 38 y.o. female with a mobile cecum and history of choledocholithiasis.  --Discussed with the patient the findings on her CT scan and on exam.  Although at the inferior midline area the mesh does appear to invaginate between the rectus muscles, there is no true hernia on imaging, and on exam today, there is no palpable bulging.  Her pain is not in this inferior midline area but rather just superior and left of umbilicus.  CT scan did show a mobile cecum, with the cecum being dilated to 7 cm and lying in epigastric region above the liver.  No evidence of cecal volvulus, and her labs were normal in the ED. --Discussed with the patient that I think the intermittent pain that she's having is more likely related to the mobile cecum rather than the  inferior midline as there's no hernia palpable.  Discussed with her how the mobile cecum could cause pain with some possible partial obstruction and cause her pain and emesis.  Discussed with her that this does put her at increased risk of cecal volvulus which could lead to perforation and her getting very ill.  Discussed with her that I would recommend having a right colectomy to help with this. --On personal view of her CT scan, her gallbladder is also extremely contracted, enough that on this last CT scan, the report reads an absent gallbladder, yet the patient has not had a cholecystectomy.  Ultrasound of RUQ on 04/27/23 showed a decompressed gallbladder with cholelithiasis.  Unlikely that this is contributing to her pain as she has not had any RUQ pain.  However, given that she's required ERCP twice for this in the past, discussed with her that we could also potentially do a cholecystectomy at the same time if the gallbladder is accessible.  --Discussed with her then the plan for an open right colectomy with ileocolonic anastomosis and open cholecystectomy.  Reviewed the surgery at length including the planned incision, the risks of bleeding, infection, injury to surrounding structures, hospital stay, post-operative activity restriction, pain control, bowel prep, and she's willing to proceed.  --Will schedule her for surgery on 05/27/24.  Will also send for medical clearance.  All of her questions have been answered.  I spent 60 minutes dedicated to the care of this patient on the date of this encounter to include pre-visit review of records, face-to-face time with the patient discussing diagnosis and management, and any post-visit coordination of care.   Aloysius Sheree Plant, MD [facility]

## 2024-04-30 ENCOUNTER — Other Ambulatory Visit: Payer: Self-pay | Admitting: *Deleted

## 2024-04-30 ENCOUNTER — Telehealth: Payer: Self-pay | Admitting: Surgery

## 2024-04-30 DIAGNOSIS — K831 Obstruction of bile duct: Secondary | ICD-10-CM

## 2024-04-30 MED ORDER — POLYETHYLENE GLYCOL 3350 17 GM/SCOOP PO POWD
238.0000 g | Freq: Once | ORAL | 0 refills | Status: AC
Start: 2024-04-30 — End: 2024-04-30

## 2024-04-30 NOTE — Telephone Encounter (Signed)
 Patient has been advised of Pre-Admission date/time, and Surgery date at [facility].  Surgery Date: 05/27/24 Preadmission Testing Date: 05/15/24 (phone 8a-1p)  Patient informed of the scheduling process and surgery information given at time of office visit.   Patient has been made aware to call [phone], between 1-3:00pm the day before surgery, to find out what time to arrive for surgery.

## 2024-05-10 ENCOUNTER — Encounter: Payer: Self-pay | Admitting: Urgent Care

## 2024-05-15 ENCOUNTER — Encounter
Admission: RE | Admit: 2024-05-15 | Discharge: 2024-05-15 | Disposition: A | Discharge status: Home / Self Care | Payer: MEDICAID | Source: Ambulatory Visit | Attending: Surgery | Admitting: Surgery

## 2024-05-15 ENCOUNTER — Other Ambulatory Visit: Payer: Self-pay

## 2024-05-15 ENCOUNTER — Emergency Department: Payer: MEDICAID

## 2024-05-15 ENCOUNTER — Inpatient Hospital Stay
Admission: EM | Admit: 2024-05-15 | Discharge: 2024-05-17 | DRG: 699 | Disposition: A | Discharge status: Home / Self Care | Payer: MEDICAID | Attending: Internal Medicine | Admitting: Internal Medicine

## 2024-05-15 DIAGNOSIS — E66813 Obesity, class 3: Secondary | ICD-10-CM | POA: Diagnosis present

## 2024-05-15 DIAGNOSIS — Z7901 Long term (current) use of anticoagulants: Secondary | ICD-10-CM

## 2024-05-15 DIAGNOSIS — Z885 Allergy status to narcotic agent status: Secondary | ICD-10-CM | POA: Diagnosis not present

## 2024-05-15 DIAGNOSIS — K8020 Calculus of gallbladder without cholecystitis without obstruction: Secondary | ICD-10-CM | POA: Diagnosis present

## 2024-05-15 DIAGNOSIS — Z880 Allergy status to penicillin: Secondary | ICD-10-CM

## 2024-05-15 DIAGNOSIS — M25552 Pain in left hip: Secondary | ICD-10-CM | POA: Diagnosis present

## 2024-05-15 DIAGNOSIS — Q433 Congenital malformations of intestinal fixation: Secondary | ICD-10-CM | POA: Diagnosis not present

## 2024-05-15 DIAGNOSIS — F209 Schizophrenia, unspecified: Secondary | ICD-10-CM | POA: Diagnosis present

## 2024-05-15 DIAGNOSIS — Z6841 Body Mass Index (BMI) 40.0 and over, adult: Secondary | ICD-10-CM | POA: Diagnosis not present

## 2024-05-15 DIAGNOSIS — F1010 Alcohol abuse, uncomplicated: Secondary | ICD-10-CM | POA: Diagnosis present

## 2024-05-15 DIAGNOSIS — R945 Abnormal results of liver function studies: Secondary | ICD-10-CM | POA: Diagnosis present

## 2024-05-15 DIAGNOSIS — F418 Other specified anxiety disorders: Secondary | ICD-10-CM | POA: Diagnosis present

## 2024-05-15 DIAGNOSIS — E876 Hypokalemia: Secondary | ICD-10-CM | POA: Diagnosis present

## 2024-05-15 DIAGNOSIS — Z8249 Family history of ischemic heart disease and other diseases of the circulatory system: Secondary | ICD-10-CM

## 2024-05-15 DIAGNOSIS — Z720 Tobacco use: Secondary | ICD-10-CM | POA: Diagnosis present

## 2024-05-15 DIAGNOSIS — R1030 Lower abdominal pain, unspecified: Secondary | ICD-10-CM | POA: Diagnosis present

## 2024-05-15 DIAGNOSIS — F17210 Nicotine dependence, cigarettes, uncomplicated: Secondary | ICD-10-CM | POA: Diagnosis present

## 2024-05-15 DIAGNOSIS — Z79899 Other long term (current) drug therapy: Secondary | ICD-10-CM | POA: Diagnosis not present

## 2024-05-15 DIAGNOSIS — Z9851 Tubal ligation status: Secondary | ICD-10-CM

## 2024-05-15 DIAGNOSIS — I700 Atherosclerosis of aorta: Secondary | ICD-10-CM | POA: Diagnosis present

## 2024-05-15 DIAGNOSIS — N280 Ischemia and infarction of kidney: Principal | ICD-10-CM | POA: Diagnosis present

## 2024-05-15 DIAGNOSIS — R109 Unspecified abdominal pain: Principal | ICD-10-CM

## 2024-05-15 DIAGNOSIS — E872 Acidosis, unspecified: Secondary | ICD-10-CM | POA: Diagnosis present

## 2024-05-15 DIAGNOSIS — Z8482 Family history of sudden infant death syndrome: Secondary | ICD-10-CM

## 2024-05-15 DIAGNOSIS — Z01812 Encounter for preprocedural laboratory examination: Secondary | ICD-10-CM

## 2024-05-15 DIAGNOSIS — F32A Depression, unspecified: Secondary | ICD-10-CM | POA: Diagnosis present

## 2024-05-15 DIAGNOSIS — Z833 Family history of diabetes mellitus: Secondary | ICD-10-CM | POA: Diagnosis not present

## 2024-05-15 DIAGNOSIS — F419 Anxiety disorder, unspecified: Secondary | ICD-10-CM | POA: Diagnosis present

## 2024-05-15 DIAGNOSIS — I10 Essential (primary) hypertension: Secondary | ICD-10-CM

## 2024-05-15 DIAGNOSIS — N10 Acute tubulo-interstitial nephritis: Secondary | ICD-10-CM | POA: Diagnosis present

## 2024-05-15 HISTORY — DX: Calculus of gallbladder without cholecystitis without obstruction: K80.20

## 2024-05-15 HISTORY — DX: Dyspnea, unspecified: R06.00

## 2024-05-15 HISTORY — DX: Essential (primary) hypertension: I10

## 2024-05-15 HISTORY — DX: Family history of sudden infant death syndrome: Z84.82

## 2024-05-15 HISTORY — DX: Congenital malformations of intestinal fixation: Q43.3

## 2024-05-15 HISTORY — DX: Alcohol abuse, uncomplicated: F10.10

## 2024-05-15 HISTORY — DX: Atherosclerosis of aorta: I70.0

## 2024-05-15 LAB — COMPREHENSIVE METABOLIC PANEL WITH GFR
ALT: 92 U/L — ABNORMAL HIGH (ref 0–44)
AST: 207 U/L — ABNORMAL HIGH (ref 15–41)
Albumin: 3.7 g/dL (ref 3.5–5.0)
Alkaline Phosphatase: 56 U/L (ref 38–126)
Anion gap: 19 — ABNORMAL HIGH (ref 5–15)
BUN: <5 mg/dL — ABNORMAL LOW (ref 6–20)
CO2: 16 mmol/L — ABNORMAL LOW (ref 22–32)
Calcium: 9.1 mg/dL (ref 8.9–10.3)
Chloride: 102 mmol/L (ref 98–111)
Creatinine, Ser: 0.65 mg/dL (ref 0.44–1.00)
GFR, Estimated: >60 mL/min (ref >60)
Glucose, Bld: 136 mg/dL — ABNORMAL HIGH (ref 70–99)
Potassium: 3.4 mmol/L — ABNORMAL LOW (ref 3.5–5.1)
Sodium: 137 mmol/L (ref 135–145)
Total Bilirubin: 0.9 mg/dL (ref 0.0–1.2)
Total Protein: 7.8 g/dL (ref 6.5–8.1)

## 2024-05-15 LAB — URINALYSIS, ROUTINE W REFLEX MICROSCOPIC
Bilirubin Urine: NEGATIVE
Glucose, UA: NEGATIVE mg/dL
Hgb urine dipstick: NEGATIVE
Ketones, ur: 5 mg/dL — AB
Leukocytes,Ua: NEGATIVE
Nitrite: NEGATIVE
Protein, ur: 30 mg/dL — AB
Specific Gravity, Urine: 1.013 (ref 1.005–1.030)
pH: 5 (ref 5.0–8.0)

## 2024-05-15 LAB — CBC
HCT: 43.6 % (ref 36.0–46.0)
Hemoglobin: 14.7 g/dL (ref 12.0–15.0)
MCH: 28.2 pg (ref 26.0–34.0)
MCHC: 33.7 g/dL (ref 30.0–36.0)
MCV: 83.5 fL (ref 80.0–100.0)
Platelets: 317 K/uL (ref 150–400)
RBC: 5.22 MIL/uL — ABNORMAL HIGH (ref 3.87–5.11)
RDW: 13 % (ref 11.5–15.5)
WBC: 10.3 K/uL (ref 4.0–10.5)
nRBC: 0 % (ref 0.0–0.2)

## 2024-05-15 LAB — APTT: aPTT: 28 s (ref 24–36)

## 2024-05-15 LAB — PROTIME-INR
INR: 1.3 — ABNORMAL HIGH (ref 0.8–1.2)
Prothrombin Time: 16.7 s — ABNORMAL HIGH (ref 11.4–15.2)

## 2024-05-15 LAB — LIPASE, BLOOD: Lipase: 25 U/L (ref 11–51)

## 2024-05-15 LAB — POC URINE PREG, ED: Preg Test, Ur: NEGATIVE

## 2024-05-15 LAB — PHOSPHORUS: Phosphorus: 4.5 mg/dL (ref 2.5–4.6)

## 2024-05-15 LAB — MAGNESIUM: Magnesium: 1.9 mg/dL (ref 1.7–2.4)

## 2024-05-15 MED ORDER — ONDANSETRON HCL 4 MG/2ML IJ SOLN
4.0000 mg | Freq: Once | INTRAMUSCULAR | Status: AC
  Administered 2024-05-15: 4 mg via INTRAVENOUS
  Filled 2024-05-15: qty 2

## 2024-05-15 MED ORDER — NICOTINE 21 MG/24HR TD PT24
21.0000 mg | MEDICATED_PATCH | Freq: Every day | TRANSDERMAL | Status: DC
  Administered 2024-05-16 – 2024-05-17 (×2): 21 mg via TRANSDERMAL
  Filled 2024-05-15 (×3): qty 1

## 2024-05-15 MED ORDER — LORAZEPAM 1 MG PO TABS: 1.0000 mg | ORAL_TABLET | ORAL | Status: DC | PRN

## 2024-05-15 MED ORDER — SODIUM CHLORIDE 0.9 % IV SOLN: INTRAVENOUS | Status: DC

## 2024-05-15 MED ORDER — MORPHINE SULFATE (PF) 4 MG/ML IV SOLN
4.0000 mg | Freq: Once | INTRAVENOUS | Status: AC
  Administered 2024-05-15: 4 mg via INTRAVENOUS
  Filled 2024-05-15: qty 1

## 2024-05-15 MED ORDER — ADULT MULTIVITAMIN W/MINERALS CH
1.0000 | ORAL_TABLET | Freq: Every day | ORAL | Status: DC
  Administered 2024-05-15 – 2024-05-17 (×3): 1 via ORAL
  Filled 2024-05-15 (×3): qty 1

## 2024-05-15 MED ORDER — LORAZEPAM 2 MG PO TABS: 0.0000 mg | ORAL_TABLET | Freq: Two times a day (BID) | ORAL | Status: DC

## 2024-05-15 MED ORDER — MORPHINE SULFATE (PF) 2 MG/ML IV SOLN
2.0000 mg | INTRAVENOUS | Status: DC | PRN
  Administered 2024-05-16 – 2024-05-17 (×4): 2 mg via INTRAVENOUS
  Filled 2024-05-15 (×4): qty 1

## 2024-05-15 MED ORDER — CHLORDIAZEPOXIDE HCL 25 MG PO CAPS
25.0000 mg | ORAL_CAPSULE | Freq: Once | ORAL | Status: AC
  Administered 2024-05-15: 25 mg via ORAL
  Filled 2024-05-15: qty 1

## 2024-05-15 MED ORDER — LORAZEPAM 2 MG/ML IJ SOLN: 1.0000 mg | INTRAMUSCULAR | Status: DC | PRN

## 2024-05-15 MED ORDER — CHLORDIAZEPOXIDE HCL 25 MG PO CAPS: 25.0000 mg | ORAL_CAPSULE | Freq: Three times a day (TID) | ORAL | Status: DC

## 2024-05-15 MED ORDER — IBUPROFEN 400 MG PO TABS: 400.0000 mg | ORAL_TABLET | Freq: Four times a day (QID) | ORAL | Status: DC | PRN

## 2024-05-15 MED ORDER — SODIUM CHLORIDE 0.9 % IV SOLN
2.0000 g | INTRAVENOUS | Status: DC
  Filled 2024-05-15: qty 20

## 2024-05-15 MED ORDER — HEPARIN BOLUS VIA INFUSION
5200.0000 [IU] | Freq: Once | INTRAVENOUS | Status: AC
  Administered 2024-05-15: 5200 [IU] via INTRAVENOUS
  Filled 2024-05-15: qty 5200

## 2024-05-15 MED ORDER — LORAZEPAM 2 MG/ML IJ SOLN: 0.0000 mg | Freq: Two times a day (BID) | INTRAMUSCULAR | Status: DC

## 2024-05-15 MED ORDER — HYDRALAZINE HCL 20 MG/ML IJ SOLN: 5.0000 mg | INTRAMUSCULAR | Status: DC | PRN

## 2024-05-15 MED ORDER — LORAZEPAM 2 MG PO TABS
0.0000 mg | ORAL_TABLET | Freq: Four times a day (QID) | ORAL | Status: DC
  Administered 2024-05-15: 2 mg via ORAL
  Filled 2024-05-15: qty 1

## 2024-05-15 MED ORDER — SODIUM CHLORIDE 0.9 % IV SOLN
1.0000 g | Freq: Once | INTRAVENOUS | Status: AC
  Administered 2024-05-16: 1 g via INTRAVENOUS
  Filled 2024-05-15: qty 10

## 2024-05-15 MED ORDER — THIAMINE MONONITRATE 100 MG PO TABS
100.0000 mg | ORAL_TABLET | Freq: Every day | ORAL | Status: DC
  Administered 2024-05-15 – 2024-05-17 (×3): 100 mg via ORAL
  Filled 2024-05-15 (×3): qty 1

## 2024-05-15 MED ORDER — FOLIC ACID 1 MG PO TABS
1.0000 mg | ORAL_TABLET | Freq: Every day | ORAL | Status: DC
  Administered 2024-05-15 – 2024-05-17 (×3): 1 mg via ORAL
  Filled 2024-05-15 (×3): qty 1

## 2024-05-15 MED ORDER — SODIUM CHLORIDE 0.9 % IV BOLUS
1000.0000 mL | Freq: Once | INTRAVENOUS | Status: AC
  Administered 2024-05-15: 1000 mL via INTRAVENOUS

## 2024-05-15 MED ORDER — SODIUM CHLORIDE 0.9 % IV SOLN
1.0000 g | Freq: Once | INTRAVENOUS | Status: AC
  Administered 2024-05-15: 1 g via INTRAVENOUS
  Filled 2024-05-15: qty 10

## 2024-05-15 MED ORDER — LORAZEPAM 2 MG/ML IJ SOLN: 0.0000 mg | Freq: Four times a day (QID) | INTRAMUSCULAR | Status: DC

## 2024-05-15 MED ORDER — HEPARIN (PORCINE) 25000 UT/250ML-% IV SOLN
1400.0000 [IU]/h | INTRAVENOUS | Status: DC
  Administered 2024-05-15: 1400 [IU]/h via INTRAVENOUS
  Filled 2024-05-15: qty 250

## 2024-05-15 MED ORDER — SODIUM CHLORIDE 0.9% FLUSH: 10.0000 mL | INTRAVENOUS | Status: DC | PRN

## 2024-05-15 MED ORDER — SODIUM CHLORIDE 0.9 % IV SOLN
12.5000 mg | Freq: Once | INTRAVENOUS | Status: AC
  Administered 2024-05-15: 12.5 mg via INTRAVENOUS
  Filled 2024-05-15: qty 0.5

## 2024-05-15 MED ORDER — THIAMINE HCL 100 MG/ML IJ SOLN: 100.0000 mg | Freq: Every day | INTRAMUSCULAR | Status: DC

## 2024-05-15 MED ORDER — ONDANSETRON HCL 4 MG/2ML IJ SOLN: 4.0000 mg | Freq: Three times a day (TID) | INTRAMUSCULAR | Status: DC | PRN

## 2024-05-15 MED ORDER — OXYCODONE HCL 5 MG PO TABS
5.0000 mg | ORAL_TABLET | Freq: Four times a day (QID) | ORAL | Status: DC | PRN
  Administered 2024-05-15 – 2024-05-16 (×2): 5 mg via ORAL
  Filled 2024-05-15 (×2): qty 1

## 2024-05-15 MED ORDER — IOHEXOL 300 MG/ML  SOLN
100.0000 mL | Freq: Once | INTRAMUSCULAR | Status: AC | PRN
  Administered 2024-05-15: 100 mL via INTRAVENOUS

## 2024-05-15 MED ORDER — POTASSIUM CHLORIDE CRYS ER 20 MEQ PO TBCR
40.0000 meq | EXTENDED_RELEASE_TABLET | Freq: Once | ORAL | Status: AC
  Administered 2024-05-15: 40 meq via ORAL
  Filled 2024-05-15: qty 2

## 2024-05-15 NOTE — H&P (Signed)
 History and Physical    GEMINI BUNTE FMW:[id number] DOB: [birth year] DOA: 05/15/2024  Referring MD/NP/PA:   PCP: [facility]   Patient coming from:  The patient is coming from home.     Chief Complaint: Nausea, vomiting, abdominal pain  HPI: [name] is a 38 y.o. female with medical history significant of cholelithiasis, mobile cecum, SBO, s/p of ventral hernia repair, HTN, depression with anxiety, psychosis, schizophrenia, tobacco abuse, alcohol use, who presents with nausea, vomiting, abdominal pain.   Patient states that she has nausea, vomiting, abdominal pain for more than 3 days.  Her abdominal pain is mainly located in the right lower quadrant, constant, aching, severe, radiating to the back, associate with nausea and multiple episode of nonbilious nonbloody vomiting.  No diarrhea.  She states that she had lightheadedness and blurry vision earlier, which has resolved.  No unilateral numbness or tingling in extremities, no facial droop or slurred speech.  No fever or chills.  Patient states that she has increased urinary frequency sometimes, denies dysuria or burning with urination.  No chest pain, cough, SOB.  No recent fall or head injury.  No rectal bleeding dark stool.  Patient also complains of left hip pain radiating to the left groin area.  Of note, due to history of cholelithiasis and mobile cecum.  Patient is scheduled for surgery on 05/27/2024 by Dr. Desiderio.  Data reviewed independently and ED Course: pt was found to have WBC 10.3, GFR> 60, potassium 3.4, magnesium  1.9, phosphorus 4.5, bicarbonate 18, anion gap 19, UA (cloudy appearance, negative leukocyte, rare bacteria, WBC 21-50, squamous cell 11-20), negative pregnancy test.  Temperature normal, blood pressure 133/66, heart rate 88, RR 20, oxygen saturation 93-96% on room air.  Patient is admitted to telemetry bed as inpatient.  Dr. Marea of [facility] is consulted.  CT of abdomen/pelvis: 1. Right renal  infarcts. 2. No bowel obstruction. Normal appendix. 3.  Aortic Atherosclerosis (ICD10-I70.0). 3.  No calcified gallstone.   EKG: Not done in ED, will get one.    Review of Systems:   General: no fevers, chills, no body weight gain, has poor appetite, has fatigue HEENT: no blurry vision, hearing changes or sore throat Respiratory: no dyspnea, coughing, wheezing CV: no chest pain, no palpitations GI: has nausea, vomiting, abdominal pain, no diarrhea, constipation GU: no dysuria, burning on urination, increased urinary frequency, hematuria  Ext: no leg edema Neuro: no unilateral weakness, numbness, or tingling, no vision change or hearing loss Skin: no rash, no skin tear. MSK: No muscle spasm, no deformity, no limitation of range of movement in spin. Has right hip pain Heme: No easy bruising.  Travel history: No recent long distant travel.   Allergy:  Allergies  Allergen Reactions   Amoxicillin     Vicodin [Hydrocodone -Acetaminophen ]     Past Medical History:  Diagnosis Date   Abdominal pain affecting pregnancy, antepartum 02/28/2016   Anemia    Anxiety    Aortic atherosclerosis (HCC)    Cholelithiasis    Depression    Dyspnea    ETOH abuse    Family history of SIDS (sudden infant death syndrome)    a.) 3rd child (son) died at 22 months of age due to SIDS   Gallstones 2018   HTN (hypertension)    Incisional ventral hernia w obstruction    Mobile cecum    Obesity 09/11/2013   Schizophrenia (HCC)    Small bowel obstruction (HCC)    Tobacco abuse 09/11/2013  Past Surgical History:  Procedure Laterality Date   CESAREAN SECTION N/A 06/12/2016   Procedure: STAT CESAREAN SECTION WITH BILATERAL STERILIZATION;  Surgeon: Debby JINNY Dinsmore, MD;  Location: [facility] ORS;  Service: Obstetrics;  Laterality: N/A;  Baby Girl @ 2246 Weight 5 lb 10 oz Apgars 8/9   ENDOSCOPIC RETROGRADE CHOLANGIOPANCREATOGRAPHY (ERCP) WITH PROPOFOL  N/A 06/06/2017   Procedure: ENDOSCOPIC RETROGRADE  CHOLANGIOPANCREATOGRAPHY (ERCP) WITH PROPOFOL ;  Surgeon: Jinny Carmine, MD;  Location: [facility] ENDOSCOPY;  Service: Endoscopy;  Laterality: N/A;   ERCP N/A 10/30/2017   Procedure: ENDOSCOPIC RETROGRADE CHOLANGIOPANCREATOGRAPHY (ERCP) STENT REMOVAL;  Surgeon: Jinny Carmine, MD;  Location: [facility] ENDOSCOPY;  Service: Endoscopy;  Laterality: N/A;   INCISIONAL HERNIA REPAIR N/A 05/07/2017   Procedure: LAPAROSCOPIC INCISIONAL HERNIA;  Surgeon: Desiderio Schanz, MD;  Location: [facility] ORS;  Service: General;  Laterality: N/A;   TUBAL LIGATION     VENTRAL HERNIA REPAIR N/A 06/19/2016   Procedure: HERNIA REPAIR VENTRAL ADULT;  Surgeon: Charlie FORBES Fell, MD;  Location: [facility] ORS;  Service: General;  Laterality: N/A;    Social History:  reports that she has been smoking cigarettes. She has a 15 pack-year smoking history. She has never used smokeless tobacco. She reports current alcohol use of about 7.0 standard drinks of alcohol per week. She reports current drug use.  Family History:  Family History  Problem Relation Age of Onset   Diabetes Mother    Asthma Sister    Hypertension Maternal Aunt    Hypertension Maternal Uncle      Prior to Admission medications   Medication Sig Start Date End Date Taking? Authorizing Provider  dicyclomine  (BENTYL ) 20 MG tablet Take 1 tablet (20 mg total) by mouth every 8 (eight) hours as needed. 04/21/24   Ward, Josette SAILOR, DO  metroNIDAZOLE  (FLAGYL ) 500 MG tablet Take 2 tabs (1000mg  total) at 8am, again at 2pm, and again at 8pm the day prior to surgery 04/28/24   Desiderio Schanz, MD  neomycin  (MYCIFRADIN ) 500 MG tablet Take 2 tabs (1000mg  total) at 8am, again at 2pm, and again at 8pm the day prior to surgery 04/28/24   Desiderio Schanz, MD  ondansetron  (ZOFRAN -ODT) 4 MG disintegrating tablet Take 1 tablet (4 mg total) by mouth every 6 (six) hours as needed for nausea or vomiting. 04/21/24   Ward, Josette SAILOR, DO  simethicone  (MYLICON) 80 MG chewable tablet Chew 1 tablet (80 mg total) by mouth  every 6 (six) hours as needed (abdominal pain, gas). 04/21/24   Ward, Josette SAILOR, DO  traZODone  (DESYREL ) 50 MG tablet Take 1 tablet (50 mg total) by mouth at bedtime as needed for sleep. 09/04/19   Kendall Cathlyn Collum, MD    Physical Exam: Vitals:   05/15/24 1843 05/15/24 2015 05/15/24 2032 05/15/24 2110  BP:  133/66  (!) 153/60  Pulse:  85  80  Resp:  19  18  Temp: 98.5 F (36.9 C)   99.2 F (37.3 C)  TempSrc: Oral   Oral  SpO2:  96%  96%  Weight:   131.1 kg    General: Not in acute distress HEENT:       Eyes: PERRL, EOMI, no jaundice       ENT: No discharge from the ears and nose, no pharynx injection, no tonsillar enlargement.        Neck: No JVD, no bruit, no mass felt. Heme: No neck lymph node enlargement. Cardiac: S1/S2, RRR, No murmurs, No gallops or rubs. Respiratory: No rales, wheezing, rhonchi or rubs. GI: Soft,  nondistended, has RLQ tenderness, no rebound pain, no organomegaly, BS present. GU: No hematuria Ext: No pitting leg edema bilaterally. 1+DP/PT pulse bilaterally. Musculoskeletal: has tenderness in left hip Skin: No rashes.  Neuro: Alert, oriented X3, cranial nerves II-XII grossly intact, moves all extremities normally. Psych: Patient is not psychotic, no suicidal or hemocidal ideation.  Labs on Admission: I have personally reviewed following labs and imaging studies  CBC: Recent Labs  Lab 05/15/24 1350  WBC 10.3  HGB 14.7  HCT 43.6  MCV 83.5  PLT 317   Basic Metabolic Panel: Recent Labs  Lab 05/15/24 1350  NA 137  K 3.4*  CL 102  CO2 16*  GLUCOSE 136*  BUN <5*  CREATININE 0.65  CALCIUM 9.1  MG 1.9  PHOS 4.5   GFR: Estimated Creatinine Clearance: 129.7 mL/min (by C-G formula based on SCr of 0.65 mg/dL). Liver Function Tests: Recent Labs  Lab 05/15/24 1350  AST 207*  ALT 92*  ALKPHOS 56  BILITOT 0.9  PROT 7.8  ALBUMIN 3.7   Recent Labs  Lab 05/15/24 1350  LIPASE 25   No results for input(s): AMMONIA in the last 168  hours. Coagulation Profile: Recent Labs  Lab 05/15/24 2037  INR 1.3*   Cardiac Enzymes: No results for input(s): CKTOTAL, CKMB, CKMBINDEX, TROPONINI in the last 168 hours. BNP (last 3 results) No results for input(s): PROBNP in the last 8760 hours. HbA1C: No results for input(s): HGBA1C in the last 72 hours. CBG: No results for input(s): GLUCAP in the last 168 hours. Lipid Profile: No results for input(s): CHOL, HDL, LDLCALC, TRIG, CHOLHDL, LDLDIRECT in the last 72 hours. Thyroid Function Tests: No results for input(s): TSH, T4TOTAL, FREET4, T3FREE, THYROIDAB in the last 72 hours. Anemia Panel: No results for input(s): VITAMINB12, FOLATE, FERRITIN, TIBC, IRON, RETICCTPCT in the last 72 hours. Urine analysis:    Component Value Date/Time   COLORURINE AMBER (A) 05/15/2024 1554   APPEARANCEUR CLOUDY (A) 05/15/2024 1554   APPEARANCEUR Hazy 03/08/2012 0910   LABSPEC 1.013 05/15/2024 1554   LABSPEC 1.006 03/08/2012 0910   PHURINE 5.0 05/15/2024 1554   GLUCOSEU NEGATIVE 05/15/2024 1554   GLUCOSEU Negative 03/08/2012 0910   HGBUR NEGATIVE 05/15/2024 1554   BILIRUBINUR NEGATIVE 05/15/2024 1554   BILIRUBINUR Negative 03/08/2012 0910   KETONESUR 5 (A) 05/15/2024 1554   PROTEINUR 30 (A) 05/15/2024 1554   NITRITE NEGATIVE 05/15/2024 1554   LEUKOCYTESUR NEGATIVE 05/15/2024 1554   LEUKOCYTESUR 1+ 03/08/2012 0910   Sepsis Labs: @LABRCNTIP (procalcitonin:4,lacticidven:4) )No results found for this or any previous visit (from the past 240 hours).   Radiological Exams on Admission:   Assessment/Plan Principal Problem:   Renal infarction [facility]) Active Problems:   Acute pyelonephritis   Abnormal liver function   HTN (hypertension)   Hypokalemia   Metabolic acidosis   Tobacco abuse   ETOH abuse   Mobile cecum   Cholelithiasis   Schizophrenia (HCC)   Depression with anxiety   Obesity, Class III, BMI 40-49.9 (morbid  obesity)   Assessment and Plan:   Renal infarction [facility]): EDP consulted with Dr. Marea of [facility]. - Admit to telemetry (inpatient - IV heparin  started in ED - Pain control: As needed morphine , oxycodone , ibuprofen  - As needed Zofran  - IV fluid: 1 L normal saline, NS 75 cc/h - N.p.o. after midnight  Possible acute pyelonephritis -IV Rocephin  - Follow-up urine culture  Abnormal liver function: ALP 56, AST 2 7, ALT 92, total bili Ruben 0.9, likely due to alcohol use. -Avoid  using Tylenol   HTN (hypertension): Patient is not taking medications.  Blood pressure 133/66 -IV hydralazine  as needed  Hypokalemia: Potassium 3.4.  Magnesium  1.9, phosphorus of 4.5. -Repleted potassium  Metabolic acidosis: Bicarbonate 16, anion gap 19, likely due to alcoholic acidosis - IV fluid: 1 L normal saline, then 75 cc/h  Tobacco abuse and ETOH abuse -Did counsel about importance of quitting smoking and alcohol use - Nicotine  patch  Mobile cecum and cholelithiasis -Patient is scheduled for surgery by Dr.  Desiderio on 80/90/25.  Schizophrenia and depression with anxiety: Patient is currently not taking medications. -Observe closely - As needed hydroxyzine  for anxiety  Obesity, Class III, BMI 40-49.9 (morbid obesity): Patient has Obesity Class III, with body weight  131.1 Kg and BMI 49.61 kg/m2.  - Encourage losing weight - Exercise and healthy diet  Left hip pain: -f/u Xay of right hip/pelvis     DVT ppx: on IV Heparin      Code Status: Full code    Family Communication:     not done, no family member is at bed side.      Disposition Plan:  Anticipate discharge back to previous environment  Consults called: Consulted Dr. Marea of [facility].  Admission status and Level of care: Telemetry Medical:  as inpt        Dispo: The patient is from: Home              Anticipated d/c is to: Home              Anticipated d/c date is: 2 days              Patient currently is not medically stable to  d/c.    Severity of Illness:  The appropriate patient status for this patient is INPATIENT. Inpatient status is judged to be reasonable and necessary in order to provide the required intensity of service to ensure the patient's safety. The patient's presenting symptoms, physical exam findings, and initial radiographic and laboratory data in the context of their chronic comorbidities is felt to place them at high risk for further clinical deterioration. Furthermore, it is not anticipated that the patient will be medically stable for discharge from the hospital within 2 midnights of admission.   * I certify that at the point of admission it is my clinical judgment that the patient will require inpatient hospital care spanning beyond 2 midnights from the point of admission due to high intensity of service, high risk for further deterioration and high frequency of surveillance required.*       Date of Service 05/16/2024    Caleb Exon [facility]   If 7PM-7AM, please contact night-coverage [email or web address] 05/16/2024, 2:21 AM

## 2024-05-15 NOTE — ED Provider Notes (Addendum)
 [facility] Provider Note    Event Date/Time   First MD Initiated Contact with Patient 05/15/24 1504     (approximate)  History   Chief Complaint: Abdominal Pain  HPI  [name] is a 38 y.o. female with a past medical history of anemia, anxiety, hypertension, schizophrenia, presents to the emergency department for abdominal pain.  According to the patient over the past several days she has had worsening abdominal pain, states she has an abdominal surgery scheduled in approximately 2 weeks for some intestinal issue although the patient is not entirely clear as to what this issue is.  Patient states she was told by her surgeon if the pain worsens to she come to the emergency department.  Patient states she has had pain intermittently for weeks or longer at this point but today seemed worse so she came to the emergency department.  Patient also states while she was at home she was feeling somewhat dizzy and felt like her vision was blurry however this only lasted a very brief amount of time.  Here in the emergency department denies any dizziness or headache.  Patient is complaining of abdominal discomfort and some nausea.  Patient does admit to significant alcohol use but is not drinking anything since yesterday.  Physical Exam   Triage Vital Signs: ED Triage Vitals  Encounter Vitals Group     BP 05/15/24 1354 95/67     Girls Systolic BP Percentile --      Girls Diastolic BP Percentile --      Boys Systolic BP Percentile --      Boys Diastolic BP Percentile --      Pulse Rate 05/15/24 1348 88     Resp 05/15/24 1348 20     Temp 05/15/24 1348 98.2 F (36.8 C)     Temp Source 05/15/24 1348 Oral     SpO2 05/15/24 1348 93 %     Weight --      Height --      Head Circumference --      Peak Flow --      Pain Score 05/15/24 1348 10     Pain Loc --      Pain Education --      Exclude from Growth Chart --     Most recent vital signs: Vitals:   05/15/24  1348 05/15/24 1354  BP:  95/67  Pulse: 88   Resp: 20   Temp: 98.2 F (36.8 C)   SpO2: 93%     General: Awake, no distress.  CV:  Good peripheral perfusion.  Regular rate and rhythm  Resp:  Normal effort.  Equal breath sounds bilaterally.  Abd:  No distention.  Soft, mild diffuse tenderness, more so tender in the left lower quadrant.  No rebound or guarding.   ED Results / Procedures / Treatments   RADIOLOGY  I have reviewed interpreted CT images.  No obvious obstruction or significant abnormality my evaluation. Radiology is read the CT is most consistent with a right renal infarcts.   MEDICATIONS ORDERED IN ED: Medications  morphine  (PF) 4 MG/ML injection 4 mg (4 mg Intravenous Given 05/15/24 1549)  ondansetron  (ZOFRAN ) injection 4 mg (4 mg Intravenous Given 05/15/24 1549)  sodium chloride  0.9 % bolus 1,000 mL (1,000 mLs Intravenous New Bag/Given 05/15/24 1550)     IMPRESSION / MDM / ASSESSMENT AND PLAN / ED COURSE  I reviewed the triage vital signs and the nursing notes.  Patient's presentation  is most consistent with acute presentation with potential threat to life or bodily function.  Patient presents to the emergency department for abdominal pain and nausea.  Patient's lab work today reassuringly shows a normal CBC with a normal white blood cell count.  Patient's chemistry however does show an anion gap of 19 mild LFT elevation normal lipase.  Special attention on exam pain to the right upper quadrant patient actually denies any pain to palpation in the right upper quadrant states her pain is more in the left lower quadrant.  Patient's pregnancy test is negative, urinalysis is pending.  We will treat the patient's pain nausea IV hydrate.  Anion gap is elevated to 19 which could indicate more dehydration or possible alcoholic ketoacidosis as the patient states she does drink most every day but did not drink since yesterday.  No obvious signs of withdrawal otherwise.  Patient's lab  work has resulted showing possible infection in the urine with white blood cell count of 21-50 with rare bacteria.  CBC shows no concerning findings, chemistry again shows slight LFT elevation with a anion gap of 19 no right upper quadrant tenderness and the patient is a near daily alcohol user.  Patient CT scan has resulted showing right renal infarcts, versus pyelonephritis.  Urinalysis does show urinary tract infection.  We will send a urine culture the patient on IV Rocephin .  Will discuss with vascular for any further recommendations and admit to the hospital service for further recommendations.  I spoke with vascular surgery, given the renal infarct they recommend starting heparin .  Will admit to the hospital service for further workup and treatment.  CRITICAL CARE Performed by: Franky Moores   Total critical care time: 30 minutes  Critical care time was exclusive of separately billable procedures and treating other patients.  Critical care was necessary to treat or prevent imminent or life-threatening deterioration.  Critical care was time spent personally by me on the following activities: development of treatment plan with patient and/or surrogate as well as nursing, discussions with consultants, evaluation of patient's response to treatment, examination of patient, obtaining history from patient or surrogate, ordering and performing treatments and interventions, ordering and review of laboratory studies, ordering and review of radiographic studies, pulse oximetry and re-evaluation of patient's condition.   FINAL CLINICAL IMPRESSION(S) / ED DIAGNOSES   Abdominal pain Renal infarcts Urinary tract infection  Note:  This document was prepared using Dragon voice recognition software and may include unintentional dictation errors.   Moores Franky, MD 05/15/24 2012    Moores Franky, MD 05/15/24 2017

## 2024-05-15 NOTE — Progress Notes (Signed)
 Perioperative Services Pre-Admission/Anesthesia Testing    Date: 05/15/24  Name: [name] DOB: [birth year] MRN:   [id number]  Re: Plans for surgery; bowel prep and antimicrobial regimen  Planned Surgical Procedure(s):     Case: [id number] Date/Time: 05/27/24 0715   Procedures:      COLECTOMY, RIGHT - open     CHOLECYSTECTOMY - open, possible procedure   Anesthesia type: General   Diagnosis:      Obstruction of bile duct [K83.1]     Calculus of gallbladder without cholecystitis without obstruction [K80.20]   Pre-op diagnosis:      obstruction of bile duct     cholelithiasis   Location: [facility] OR ROOM 04 / [facility]   Surgeons: Desiderio Schanz, MD        Clinical Notes:  Patient is scheduled for the above procedure on 08/19 2025 with Dr. Elnoria Desiderio, MD.  Preparation for procedure, patient participated in PAT interview on 05/15/2024.  During perioperative needs assessment, it was discovered that patient had financial constraints that are going to present potential issues with regards to upcoming procedure.  Patient is having a colectomy.  As part of this procedure, patient is required to complete a bowel prep and be on dual antimicrobial therapy (metronidazole  + neomycin ) prior to procedure.  During the course of her interview, patient verbalized that she would not be able to afford these interventions.  PAT RN stressed to patient that these interventions were required for her to be able to have surgery.  Patient stated that she did not know how she was going to make that happen and therefore was unsure about her ability to proceed with surgery as scheduled.  Additionally, patient will need laboratory studies and ECG prior to her procedure.  Patient did not show up for her scheduled appointment today.  In speaking with the patient, she was experiencing significant abdominal pain, nausea, headache, dizziness, and blurred vision.  This is why patient did not show  up for her appointment.  Patient was rescheduled for Monday for her labs, however in review of her chart, I noted that patient is currently in the ED.  With assistance from the PAT team, we will endeavor to obtain all preoperative lab studies and ECG today while the patient is here.  In efforts to assist patient with facilitation of preoperative medication needs, I have also entered a consult to the transitions of care team for perioperative needs assessment and medication assistance.  Detailed note entered on the consult detailing that patient would need assistance with bowel prep and preoperative antimicrobial regimen.  I asked if it would be possible for [facility] team to see the patient while she was here in the facility being seen in the ED.  If not, I have asked them to follow-up with her via phone to discuss potential medication assistance.  Written surgical instructions were taken to the ED and given to the patient's nurse.  ED RN was asked to provide written instructions to the patient at the time of discharge.  When we previously spoke with patient earlier today, we advised patient to return call to the PAT office or to her surgeons office should she have any immediate questions or concerns.  She verbalized understanding of the instructions that were provided on the phone and plan of care and understands at this point.  I will follow-up with Dr. Desiderio to make him aware of the above. No further needs from the PAT department identified at this  time.  Dorise Pereyra, MSN, APRN, FNP-C, CEN [facility]  Perioperative Services Nurse Practitioner Phone: [phone] Fax: [phone] 05/15/24 2:37 PM  NOTE: This note has been prepared using Dragon dictation software. Despite my best ability to proofread, there is always the potential that unintentional transcriptional errors may still occur from this process.

## 2024-05-15 NOTE — Pre-Procedure Instructions (Signed)
 just FYI, attempted PAT call with patient, she voices that she cannot afford to get the 2 medications or prep needed for her surgery. I tried to schedule her for labs but she said that she would call me back. I ask her if she was planning on having the surgery and she voice that she didn't know. She also complained that she was dizzy and was having blurred vision, I suggested that she go to the ER or call EMS to get her VS checked. Sent Dr. Desiderio a secure message making him aware.

## 2024-05-15 NOTE — Patient Instructions (Addendum)
 Your procedure is scheduled on: 05/27/24 - Tuesday Report to the Registration Desk on the 1st floor of the [facility]. To find out your arrival time, please call [phone] between 1PM - 3PM on: 05/26/24 - Monday If your arrival time is 6:00 am, do not arrive before that time as the [facility] entrance doors do not open until 6:00 am.  REMEMBER: Instructions that are not followed completely may result in serious medical risk, up to and including death; or upon the discretion of your surgeon and anesthesiologist your surgery may need to be rescheduled.  To prep for your surgery, You will need to complete a bowel prep and take 2 antibiotics. Your antibiotics are Neomycin  and Metronidazole  and these will be taken at 8am, 2pm, 8pm on the day of your prep. (Please see the Colon Surgery Prep sheet provided) Gave patient blue sheet on 04/28/24 . Please pick up your prescriptions at the pharmacy [facility] on [address])- Please go over the blue sheet for your instructions to prepare for surgery, if you have any questions or concerns please call surgeons office.   metroNIDAZOLE  (FLAGYL ) 500 MG tablet Take 2 tabs (1000mg  total) at 8am, again at 2pm, and again at 8pm the day prior to surgery   neomycin  (MYCIFRADIN ) 500 MG tablet Take 2 tabs (1000mg  total) at 8am, again at 2pm, and again at 8pm the day prior to surgery     One week prior to surgery: Stop Anti-inflammatories (NSAIDS) such as Advil , Aleve, Ibuprofen , Motrin , Naproxen, Naprosyn and Aspirin based products such as Excedrin, Goody's Powder, BC Powder. You may take Tylenol  if needed for pain up until the day of surgery.  Stop ANY OVER THE COUNTER supplements until after surgery.  ON THE DAY OF SURGERY ONLY TAKE THESE MEDICATIONS WITH SIPS OF WATER:  none   No Alcohol for 24 hours before or after surgery.  No Smoking including e-cigarettes for 24 hours before surgery.  No chewable tobacco products for at least 6 hours before  surgery.  No nicotine  patches on the day of surgery.  Do not use any recreational drugs for at least a week (preferably 2 weeks) before your surgery.  Please be advised that the combination of cocaine and anesthesia may have negative outcomes, up to and including death. If you test positive for cocaine, your surgery will be cancelled.  On the morning of surgery brush your teeth with toothpaste and water, you may rinse your mouth with mouthwash if you wish. Do not swallow any toothpaste or mouthwash.  Use CHG Soap or wipes as directed on instruction sheet.  Do not wear jewelry, make-up, hairpins, clips or nail polish.  For welded (permanent) jewelry: bracelets, anklets, waist bands, etc.  Please have this removed prior to surgery.  If it is not removed, there is a chance that hospital personnel will need to cut it off on the day of surgery.  Do not wear lotions, powders, or perfumes.   Do not shave body hair from the neck down 48 hours before surgery.  Contact lenses, hearing aids and dentures may not be worn into surgery.  Do not bring valuables to the hospital. [facility] is not responsible for any missing/lost belongings or valuables.   Notify your doctor if there is any change in your medical condition (cold, fever, infection).  Wear comfortable clothing (specific to your surgery type) to the hospital.  After surgery, you can help prevent lung complications by doing breathing exercises.  Take deep breaths  and cough every 1-2 hours. Your doctor may order a device called an Incentive Spirometer to help you take deep breaths.  When coughing or sneezing, hold a pillow firmly against your incision with both hands. This is called "splinting." Doing this helps protect your incision. It also decreases belly discomfort.  If you are being admitted to the hospital overnight, leave your suitcase in the car. After surgery it may be brought to your room.  In case of increased patient  census, it may be necessary for you, the patient, to continue your postoperative care in the Same Day Surgery department.  If you are being discharged the day of surgery, you will not be allowed to drive home. You will need a responsible individual to drive you home and stay with you for 24 hours after surgery.   If you are taking public transportation, you will need to have a responsible individual with you.  Please call the Pre-admissions Testing Dept. at [phone] if you have any questions about these instructions.  Surgery Visitation Policy:  Patients having surgery or a procedure may have two visitors.  Children under the age of 67 must have an adult with them who is not the patient.  Inpatient Visitation:    Visiting hours are 7 a.m. to 8 p.m. Up to four visitors are allowed at one time in a patient room. The visitors may rotate out with other people during the day.  One visitor age 98 or older may stay with the patient overnight and must be in the room by 8 p.m.   Merchandiser, retail to address health-related social needs:  [email or web address]     Preparing for Surgery with CHLORHEXIDINE  GLUCONATE (CHG) Soap  Chlorhexidine  Gluconate (CHG) Soap  o An antiseptic cleaner that kills germs and bonds with the skin to continue killing germs even after washing  o Used for showering the night before surgery and morning of surgery  Before surgery, you can play an important role by reducing the number of germs on your skin.  CHG (Chlorhexidine  gluconate) soap is an antiseptic cleanser which kills germs and bonds with the skin to continue killing germs even after washing.  Please do not use if you have an allergy to CHG or antibacterial soaps. If your skin becomes reddened/irritated stop using the CHG.  1. Shower the NIGHT BEFORE SURGERY and the MORNING OF SURGERY with CHG soap.  2. If you choose to wash your hair, wash your hair first as usual with your  normal shampoo.  3. After shampooing, rinse your hair and body thoroughly to remove the shampoo.  4. Use CHG as you would any other liquid soap. You can apply CHG directly to the skin and wash gently with a scrungie or a clean washcloth.  5. Apply the CHG soap to your body only from the neck down. Do not use on open wounds or open sores. Avoid contact with your eyes, ears, mouth, and genitals (private parts). Wash face and genitals (private parts) with your normal soap.  6. Wash thoroughly, paying special attention to the area where your surgery will be performed.  7. Thoroughly rinse your body with warm water.  8. Do not shower/wash with your normal soap after using and rinsing off the CHG soap.  9. Pat yourself dry with a clean towel.  10. Wear clean pajamas to bed the night before surgery.  12. Place clean sheets on your bed the night of your first shower and do not sleep with  pets.  13. Shower again with the CHG soap on the day of surgery prior to arriving at the hospital.  14. Do not apply any deodorants/lotions/powders.  15. Please wear clean clothes to the hospital.

## 2024-05-15 NOTE — Progress Notes (Signed)
 PHARMACY - ANTICOAGULATION CONSULT NOTE  Pharmacy Consult for Heparin  Infusion  Indication: VTE Treatment - Renal infarcts  Allergies  Allergen Reactions   Amoxicillin     Vicodin [Hydrocodone -Acetaminophen ]     Patient Measurements: Weight: 131.1 kg (289 lb) Heparin  dosing wt: 87.4   Vital Signs: Temp: 98.5 F (36.9 C) (08/07 1843) Temp Source: Oral (08/07 1843) BP: 133/66 (08/07 2015) Pulse Rate: 85 (08/07 2015)  Labs: Recent Labs    05/15/24 1350  HGB 14.7  HCT 43.6  PLT 317  CREATININE 0.65    Estimated Creatinine Clearance: 129.7 mL/min (by C-G formula based on SCr of 0.65 mg/dL).   Medical History: Past Medical History:  Diagnosis Date   Abdominal pain affecting pregnancy, antepartum 02/28/2016   Anemia    Anxiety    Aortic atherosclerosis (HCC)    Depression    Dyspnea    ETOH abuse    Family history of SIDS (sudden infant death syndrome)    a.) 3rd child (son) died at 75 months of age due to SIDS   Gallstones 2018   HTN (hypertension)    Incisional ventral hernia w obstruction    Mobile cecum    Obesity 09/11/2013   Schizophrenia (HCC)    Small bowel obstruction (HCC)    Tobacco abuse 09/11/2013    Assessment: 38 yo female admitted with abdominal pain and nausea. 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  Pharmacy has been consulted for heparin  infusion dosing and monitoring for VTE treatment.   Goal of Therapy:  Heparin  level 0.3-0.7 units/ml Monitor platelets by anticoagulation protocol: Yes   Plan:  Give 5200 units bolus x 1 Start heparin  infusion at 1400 units/hr Check anti-Xa level in 6 hours and daily while on heparin  Continue to monitor H&H and platelets  Estill CHRISTELLA Lutes, PharmD, BCPS Clinical Pharmacist 05/15/2024 8:39 PM

## 2024-05-15 NOTE — Pre-Procedure Instructions (Signed)
 Per Dr. Desiderio we will reschedule her PAT appointment and follow up with her next week.

## 2024-05-15 NOTE — ED Triage Notes (Signed)
 Pt to ED via [facility] from home. Pt called due to dizziness and blurry vision. Pt reports HA, abd pain and N/V. Pt reports GI surgery for 8/19  139/79 84 HR  100% RA CBG 118

## 2024-05-15 NOTE — ED Triage Notes (Signed)
 Pt to ED via EMS with abdominal pain and instance of vision changes that has since resolved. Pt states she is scheduled for intestinal surgery on 05/27/24. A&O x4, no neurological deficits at this time.

## 2024-05-16 ENCOUNTER — Other Ambulatory Visit: Payer: Self-pay

## 2024-05-16 ENCOUNTER — Telehealth (HOSPITAL_COMMUNITY): Payer: Self-pay | Admitting: Pharmacy Technician

## 2024-05-16 ENCOUNTER — Inpatient Hospital Stay: Payer: MEDICAID

## 2024-05-16 ENCOUNTER — Other Ambulatory Visit (HOSPITAL_COMMUNITY): Payer: Self-pay

## 2024-05-16 ENCOUNTER — Telehealth: Payer: Self-pay | Admitting: Surgery

## 2024-05-16 ENCOUNTER — Inpatient Hospital Stay (HOSPITAL_COMMUNITY)
Admit: 2024-05-16 | Discharge: 2024-05-16 | Disposition: A | Discharge status: Home / Self Care | Payer: MEDICAID | Attending: Internal Medicine | Admitting: Internal Medicine

## 2024-05-16 DIAGNOSIS — I10 Essential (primary) hypertension: Secondary | ICD-10-CM

## 2024-05-16 DIAGNOSIS — K8020 Calculus of gallbladder without cholecystitis without obstruction: Secondary | ICD-10-CM | POA: Diagnosis present

## 2024-05-16 DIAGNOSIS — N280 Ischemia and infarction of kidney: Secondary | ICD-10-CM | POA: Diagnosis not present

## 2024-05-16 LAB — COMPREHENSIVE METABOLIC PANEL WITH GFR
ALT: 122 U/L — ABNORMAL HIGH (ref 0–44)
AST: 190 U/L — ABNORMAL HIGH (ref 15–41)
Albumin: 3.3 g/dL — ABNORMAL LOW (ref 3.5–5.0)
Alkaline Phosphatase: 55 U/L (ref 38–126)
Anion gap: 9 (ref 5–15)
BUN: <5 mg/dL — ABNORMAL LOW (ref 6–20)
CO2: 25 mmol/L (ref 22–32)
Calcium: 8.7 mg/dL — ABNORMAL LOW (ref 8.9–10.3)
Chloride: 102 mmol/L (ref 98–111)
Creatinine, Ser: 0.62 mg/dL (ref 0.44–1.00)
GFR, Estimated: >60 mL/min (ref >60)
Glucose, Bld: 112 mg/dL — ABNORMAL HIGH (ref 70–99)
Potassium: 3.8 mmol/L (ref 3.5–5.1)
Sodium: 136 mmol/L (ref 135–145)
Total Bilirubin: 1.1 mg/dL (ref 0.0–1.2)
Total Protein: 7.3 g/dL (ref 6.5–8.1)

## 2024-05-16 LAB — CBC
HCT: 39.5 % (ref 36.0–46.0)
Hemoglobin: 13.2 g/dL (ref 12.0–15.0)
MCH: 28.4 pg (ref 26.0–34.0)
MCHC: 33.4 g/dL (ref 30.0–36.0)
MCV: 84.9 fL (ref 80.0–100.0)
Platelets: 272 K/uL (ref 150–400)
RBC: 4.65 MIL/uL (ref 3.87–5.11)
RDW: 13.2 % (ref 11.5–15.5)
WBC: 10.3 K/uL (ref 4.0–10.5)
nRBC: 0 % (ref 0.0–0.2)

## 2024-05-16 LAB — GLUCOSE, CAPILLARY: Glucose-Capillary: 112 mg/dL — ABNORMAL HIGH (ref 70–99)

## 2024-05-16 LAB — HEPARIN LEVEL (UNFRACTIONATED)
Heparin Unfractionated: 0.34 [IU]/mL (ref 0.30–0.70)
Heparin Unfractionated: 0.18 [IU]/mL — ABNORMAL LOW (ref 0.30–0.70)

## 2024-05-16 LAB — URINE CULTURE

## 2024-05-16 LAB — HIV ANTIBODY (ROUTINE TESTING W REFLEX): HIV Screen 4th Generation wRfx: NONREACTIVE

## 2024-05-16 MED ORDER — HYDROXYZINE HCL 25 MG PO TABS: 25.0000 mg | ORAL_TABLET | Freq: Three times a day (TID) | ORAL | Status: DC | PRN

## 2024-05-16 MED ORDER — ONDANSETRON HCL 4 MG/2ML IJ SOLN: 4.0000 mg | Freq: Four times a day (QID) | INTRAMUSCULAR | Status: DC | PRN

## 2024-05-16 MED ORDER — APIXABAN 5 MG PO TABS: 5.0000 mg | ORAL_TABLET | Freq: Two times a day (BID) | ORAL | Status: DC

## 2024-05-16 MED ORDER — APIXABAN 5 MG PO TABS
10.0000 mg | ORAL_TABLET | Freq: Two times a day (BID) | ORAL | Status: DC
  Administered 2024-05-16 – 2024-05-17 (×3): 10 mg via ORAL
  Filled 2024-05-16 (×3): qty 2

## 2024-05-16 MED ORDER — KETOROLAC TROMETHAMINE 15 MG/ML IJ SOLN
15.0000 mg | Freq: Four times a day (QID) | INTRAMUSCULAR | Status: DC
  Administered 2024-05-16 – 2024-05-17 (×4): 15 mg via INTRAVENOUS
  Filled 2024-05-16 (×4): qty 1

## 2024-05-16 MED ORDER — APIXABAN 5 MG PO TABS: 10.0000 mg | ORAL_TABLET | Freq: Two times a day (BID) | ORAL | Status: DC

## 2024-05-16 MED ORDER — TRAMADOL HCL 50 MG PO TABS
50.0000 mg | ORAL_TABLET | Freq: Four times a day (QID) | ORAL | Status: DC | PRN
  Administered 2024-05-16 – 2024-05-17 (×2): 50 mg via ORAL
  Filled 2024-05-16 (×2): qty 1

## 2024-05-16 MED ORDER — ACETAMINOPHEN 325 MG PO TABS: 650.0000 mg | ORAL_TABLET | Freq: Four times a day (QID) | ORAL | Status: DC | PRN

## 2024-05-16 MED ORDER — METHOCARBAMOL 500 MG PO TABS
750.0000 mg | ORAL_TABLET | Freq: Four times a day (QID) | ORAL | Status: DC
  Administered 2024-05-16 – 2024-05-17 (×4): 750 mg via ORAL
  Filled 2024-05-16 (×4): qty 2

## 2024-05-16 MED ORDER — TRAZODONE HCL 50 MG PO TABS: 50.0000 mg | ORAL_TABLET | Freq: Every evening | ORAL | Status: DC | PRN

## 2024-05-16 MED ORDER — DICYCLOMINE HCL 20 MG PO TABS
20.0000 mg | ORAL_TABLET | Freq: Three times a day (TID) | ORAL | Status: DC
  Administered 2024-05-16 – 2024-05-17 (×4): 20 mg via ORAL
  Filled 2024-05-16 (×6): qty 1

## 2024-05-16 MED ORDER — SIMETHICONE 80 MG PO CHEW: 80.0000 mg | CHEWABLE_TABLET | Freq: Four times a day (QID) | ORAL | Status: DC | PRN

## 2024-05-16 MED ORDER — APIXABAN 5 MG PO TABS
ORAL_TABLET | ORAL | 0 refills | Status: AC
  Filled 2024-05-16: qty 190, 89d supply, fill #0

## 2024-05-16 NOTE — Telephone Encounter (Signed)
 Patient Product/process development scientist completed.    The patient is insured through [employer] [place].     Ran test claim for Eliquis  5 mg and the current 30 day co-pay is $4.00.   This test claim was processed through [facility]- copay amounts may vary at other pharmacies due to pharmacy/plan contracts, or as the patient moves through the different stages of their insurance plan.     Morgan Arab, CPHT Pharmacy Technician III Certified Patient Advocate [facility] Patient Advocate Team Direct Number: [phone]  Fax: [phone]

## 2024-05-16 NOTE — Progress Notes (Signed)
 Echocardiogram 2D Echocardiogram has been performed.  [name] 05/16/2024, 5:06 PM

## 2024-05-16 NOTE — Telephone Encounter (Signed)
 Surgery for 05/27/24 cancelled per Dr. Desiderio.  Patient with other health concerns at this time.  She is scheduled for follow up on 08/08/24.

## 2024-05-16 NOTE — Discharge Instructions (Signed)

## 2024-05-16 NOTE — Plan of Care (Signed)

## 2024-05-16 NOTE — TOC CM/SW Note (Signed)
 Transition of Care [facility]) - Inpatient Brief Assessment   Patient Details  Name: MERRILY TEGELER MRN: [id number] Date of Birth: 05-Sep-1986  Transition of Care [facility]) CM/SW Contact:    Corean ONEIDA Haddock, RN Phone Number: 05/16/2024, 9:38 AM   Clinical Narrative:  Transition of Care [facility]) Screening Note   Patient Details  Name: TIERRIA WATSON Date of Birth: 02/01/1986   Transition of Care [facility]) CM/SW Contact:    Corean ONEIDA Haddock, RN Phone Number: 05/16/2024, 9:38 AM    Transition of Care Department [facility]) has reviewed patient and no TOC needs have been identified at this time. . If new patient transition needs arise, please place a TOC consult.  Substance abuse resources added to AVS     Transition of Care Asessment: Insurance and Status: Insurance coverage has been reviewed Patient has primary care physician: Yes     Prior/Current Home Services: No current home services Social Drivers of Health Review: SDOH reviewed no interventions necessary Readmission risk has been reviewed: Yes Transition of care needs: no transition of care needs at this time

## 2024-05-16 NOTE — Progress Notes (Signed)
 PHARMACY - ANTICOAGULATION CONSULT NOTE  Pharmacy Consult for Heparin  Infusion  Indication: VTE Treatment - Renal infarcts  Allergies  Allergen Reactions   Amoxicillin     Vicodin [Hydrocodone -Acetaminophen ]     Patient Measurements: Weight: 131.1 kg (289 lb) Heparin  dosing wt: 87.4   Vital Signs: Temp: 98.2 F (36.8 C) (08/08 0931) Temp Source: Oral (08/08 0837) BP: 126/60 (08/08 0931) Pulse Rate: 78 (08/08 0931)  Labs: Recent Labs    05/15/24 1350 05/15/24 2037 05/16/24 0415 05/16/24 1043  HGB 14.7  --  13.2  --   HCT 43.6  --  39.5  --   PLT 317  --  272  --   APTT  --  28  --   --   LABPROT  --  16.7*  --   --   INR  --  1.3*  --   --   HEPARINUNFRC  --   --  0.34 0.18*  CREATININE 0.65  --  0.62  --     Estimated Creatinine Clearance: 129.7 mL/min (by C-G formula based on SCr of 0.62 mg/dL).   Medical History: Past Medical History:  Diagnosis Date   Abdominal pain affecting pregnancy, antepartum 02/28/2016   Anemia    Anxiety    Aortic atherosclerosis (HCC)    Cholelithiasis    Depression    Dyspnea    ETOH abuse    Family history of SIDS (sudden infant death syndrome)    a.) 3rd child (son) died at 28 months of age due to SIDS   Gallstones 2018   HTN (hypertension)    Incisional ventral hernia w obstruction    Mobile cecum    Obesity 09/11/2013   Schizophrenia (HCC)    Small bowel obstruction (HCC)    Tobacco abuse 09/11/2013    Assessment: 38 yo female admitted with abdominal pain and nausea. 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  Pharmacy has been consulted for heparin  infusion dosing and monitoring for VTE treatment.   Goal of Therapy:  Heparin  level 0.3-0.7 units/ml Monitor platelets by anticoagulation protocol: Yes  8/08 0415 HL 0.34, therapeutic x 1 8/08 1043 HL 0.18, SUBtherapeutic   Plan:  Change to apixaban  Recheck anti-Xa level in 6 hours to confirm Continue to monitor H&H and platelets daily  Allean Haas  PharmD Clinical Pharmacist 05/16/2024

## 2024-05-16 NOTE — Progress Notes (Signed)
 PROGRESS NOTE    [name]  FMW:[id number] DOB: October 09, 1986 DOA: 05/15/2024 PCP: [facility]    Brief Narrative:  38 y.o. female with medical history significant of cholelithiasis, mobile cecum, SBO, s/p of ventral hernia repair, HTN, depression with anxiety, psychosis, schizophrenia, tobacco abuse, alcohol use, who presents with nausea, vomiting, abdominal pain.   Patient states that she has nausea, vomiting, abdominal pain for more than 3 days.  Her abdominal pain is mainly located in the right lower quadrant, constant, aching, severe, radiating to the back, associate with nausea and multiple episode of nonbilious nonbloody vomiting.  No diarrhea.  She states that she had lightheadedness and blurry vision earlier, which has resolved.  No unilateral numbness or tingling in extremities, no facial droop or slurred speech.  No fever or chills.  Patient states that she has increased urinary frequency sometimes, denies dysuria or burning with urination.  No chest pain, cough, SOB.  No recent fall or head injury.  No rectal bleeding dark stool.  Patient also complains of left hip pain radiating to the left groin area.   Assessment & Plan:   Principal Problem:   Renal infarction [facility]) Active Problems:   Acute pyelonephritis   Abnormal liver function   HTN (hypertension)   Hypokalemia   Metabolic acidosis   Tobacco abuse   ETOH abuse   Mobile cecum   Cholelithiasis   Schizophrenia (HCC)   Depression with anxiety   Obesity, Class III, BMI 40-49.9 (morbid obesity)  Renal infarction [facility]): EDP consulted with Dr. Marea of [facility]. Etiology of renal infarcts is unclear.  Vascular surgery recommends Eliquis  for anticoagulation.  Recommend 37-month course. Plan: Okay for diet Check 2D echo No IV fluids Multimodal pain control Avoid IV narcotics   Possible acute pyelonephritis, ruled out Urinalysis inconsistent with infection DC Rocephin  -IV Rocephin  - Follow-up urine  culture   Abnormal liver function: ALP 56, AST 2 7, ALT 92, total bili Ruben 0.9, likely due to alcohol use.   HTN (hypertension): Patient is not taking medications.  Blood pressure 133/66 -IV hydralazine  as needed   Hypokalemia: Potassium 3.4.  Magnesium  1.9, phosphorus of 4.5. -Repleted potassium   Metabolic acidosis: Resolved.  DC IV fluids   Tobacco abuse and ETOH abuse -Did counsel about importance of quitting smoking and alcohol use - Nicotine  patch   Mobile cecum and cholelithiasis -Patient is scheduled for surgery by Dr.  Desiderio on 05/27/24. [place] notified.  Surgery may need to be delayed as patient will require 3 months of anticoagulation   Schizophrenia and depression with anxiety: Patient is currently not taking medications. -Observe closely - As needed hydroxyzine  for anxiety   Obesity, Class III, BMI 40-49.9 (morbid obesity): Patient has Obesity Class III, with body weight  131.1 Kg and BMI 49.61 kg/m2.  - Encourage losing weight - Exercise and healthy diet   Left hip pain: -f/u Xay of right hip/pelvis.  Negative for fracture  DVT prophylaxis: Eliquis  Code Status: Full Family Communication: None Disposition Plan: Status is: Inpatient Remains inpatient appropriate because: Intractable pain.  New diagnosis of renal infarction   Level of care: Telemetry Medical  Consultants:  Vascular surgery  Procedures:  None  Antimicrobials: None   Subjective: Seen and examined.  Continues to endorse lower abdominal pain.  Objective: Vitals:   05/15/24 2110 05/16/24 0418 05/16/24 0837 05/16/24 0931  BP: (!) 153/60 124/69 (!) 149/91 126/60  Pulse: 80 81 84 78  Resp: 18 14  16   Temp: 99.2  F (37.3 C) 98.1 F (36.7 C) 98.6 F (37 C) 98.2 F (36.8 C)  TempSrc: Oral Oral Oral   SpO2: 96% 98% 98% 93%  Weight:        Intake/Output Summary (Last 24 hours) at 05/16/2024 1153 Last data filed at 05/16/2024 9177 Gross per 24 hour  Intake 490.5 ml  Output --   Net 490.5 ml   Filed Weights   05/15/24 2032  Weight: 131.1 kg    Examination:  General exam: Appears calm and comfortable  Respiratory system: Clear to auscultation. Respiratory effort normal. Cardiovascular system: S1-S2, RRR, no murmurs, no pedal edema Gastrointestinal system: Obese, soft, NT/ND, normal bowel sounds Central nervous system: Alert and oriented. No focal neurological deficits. Extremities: Symmetric 5 x 5 power. Skin: No rashes, lesions or ulcers Psychiatry: Judgement and insight appear normal. Mood & affect appropriate.     Data Reviewed: I have personally reviewed following labs and imaging studies  CBC: Recent Labs  Lab 05/15/24 1350 05/16/24 0415  WBC 10.3 10.3  HGB 14.7 13.2  HCT 43.6 39.5  MCV 83.5 84.9  PLT 317 272   Basic Metabolic Panel: Recent Labs  Lab 05/15/24 1350 05/16/24 0415  NA 137 136  K 3.4* 3.8  CL 102 102  CO2 16* 25  GLUCOSE 136* 112*  BUN <5* <5*  CREATININE 0.65 0.62  CALCIUM 9.1 8.7*  MG 1.9  --   PHOS 4.5  --    GFR: Estimated Creatinine Clearance: 129.7 mL/min (by C-G formula based on SCr of 0.62 mg/dL). Liver Function Tests: Recent Labs  Lab 05/15/24 1350 05/16/24 0415  AST 207* 190*  ALT 92* 122*  ALKPHOS 56 55  BILITOT 0.9 1.1  PROT 7.8 7.3  ALBUMIN 3.7 3.3*   Recent Labs  Lab 05/15/24 1350  LIPASE 25   No results for input(s): AMMONIA in the last 168 hours. Coagulation Profile: Recent Labs  Lab 05/15/24 2037  INR 1.3*   Cardiac Enzymes: No results for input(s): CKTOTAL, CKMB, CKMBINDEX, TROPONINI in the last 168 hours. BNP (last 3 results) No results for input(s): PROBNP in the last 8760 hours. HbA1C: No results for input(s): HGBA1C in the last 72 hours. CBG: Recent Labs  Lab 05/16/24 0928  GLUCAP 112*   Lipid Profile: No results for input(s): CHOL, HDL, LDLCALC, TRIG, CHOLHDL, LDLDIRECT in the last 72 hours. Thyroid Function Tests: No results for  input(s): TSH, T4TOTAL, FREET4, T3FREE, THYROIDAB in the last 72 hours. Anemia Panel: No results for input(s): VITAMINB12, FOLATE, FERRITIN, TIBC, IRON, RETICCTPCT in the last 72 hours. Sepsis Labs: No results for input(s): PROCALCITON, LATICACIDVEN in the last 168 hours.  No results found for this or any previous visit (from the past 240 hours).       Radiology Studies: DG HIP UNILAT WITH PELVIS 2-3 VIEWS LEFT Result Date: 05/16/2024 CLINICAL DATA:  Left hip pain.  No injury. 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 No acute fracture or dislocation. No evidence of pelvic fracture. Remainder of the exam is unchanged. IMPRESSION: 1. No acute findings. 2. 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 Electronically Signed   By: Toribio Agreste M.D.   On: 05/16/2024 08:44   CT ABDOMEN PELVIS W CONTRAST Result Date: 05/15/2024 CLINICAL DATA:  Abdominal pain. EXAM: CT ABDOMEN AND PELVIS WITH CONTRAST TECHNIQUE: Multidetector CT imaging of the abdomen and pelvis was performed using the standard protocol  following bolus administration of intravenous contrast. RADIATION DOSE REDUCTION: This exam was performed according to the departmental dose-optimization program which includes automated exposure control, adjustment of the mA and/or kV according to patient size and/or use of iterative reconstruction technique. CONTRAST:  OMNIPAQUE  IOHEXOL  300 MG/ML  SOLN COMPARISON:  CT abdomen pelvis dated 04/21/2024. FINDINGS: Lower chest: The visualized lung bases are clear. No intra-abdominal free air or free fluid. Hepatobiliary: Apparent fatty liver. No biliary dilatation. The gallbladder appears contracted. No calcified gallstone. Pancreas: Unremarkable. No pancreatic ductal dilatation or surrounding inflammatory changes. Spleen: Normal in size without focal abnormality.  Adrenals/Urinary Tract: The adrenal glands are unremarkable. Areas of which shaped hypoperfusion involving the right kidney consistent with infarcts. Pyelonephritis is less likely. Clinical correlation recommended. There is no hydronephrosis on either side. The visualized ureters and urinary bladder appear unremarkable. 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 The cecum is mildly distended. No mechanical obstruction or twisting. There is no bowel obstruction or active inflammation. The appendix is normal. Vascular/Lymphatic: Mild atherosclerotic calcification of the abdominal aorta. The IVC is unremarkable. There is gas. There is no adenopathy. Reproductive: The uterus is grossly unremarkable. No suspicious adnexal masses. Other: Midline anterior pelvic wall incisional scar and hernia repair mesh. Abutment of loops of small bowel to the anterior pelvic mesh suggestive of adhesions. Musculoskeletal: No acute or significant osseous findings. IMPRESSION: 1. Right renal infarcts. 2. No bowel obstruction. Normal appendix. 3.  Aortic Atherosclerosis (ICD10-I70.0). Electronically Signed   By: Vanetta Chou M.D.   On: 05/15/2024 18:38        Scheduled Meds:  apixaban   10 mg Oral BID   Followed by   NOREEN ON 05/23/2024] apixaban   5 mg Oral BID   dicyclomine   20 mg Oral TID AC & HS   folic acid   1 mg Oral Daily   ketorolac   15 mg Intravenous Q6H   LORazepam   0-4 mg Oral Q6H   Followed by   NOREEN ON 05/17/2024] LORazepam   0-4 mg Oral Q12H   methocarbamol   750 mg Oral QID   multivitamin with minerals  1 tablet Oral Daily   nicotine   21 mg Transdermal Daily   thiamine   100 mg Oral Daily   Or   thiamine   100 mg Intravenous Daily   Continuous Infusions:  cefTRIAXone  (ROCEPHIN )  IV       LOS: 1 day    Calvin KATHEE Robson, MD [facility]   If 7PM-7AM, please contact night-coverage  05/16/2024, 11:53 AM

## 2024-05-16 NOTE — Progress Notes (Signed)
 PHARMACY - ANTICOAGULATION CONSULT NOTE  Pharmacy Consult for Heparin  Infusion  Indication: VTE Treatment - Renal infarcts  Allergies  Allergen Reactions   Amoxicillin     Vicodin [Hydrocodone -Acetaminophen ]     Patient Measurements: Weight: 131.1 kg (289 lb) Heparin  dosing wt: 87.4   Vital Signs: Temp: 98.1 F (36.7 C) (08/08 0418) Temp Source: Oral (08/08 0418) BP: 124/69 (08/08 0418) Pulse Rate: 81 (08/08 0418)  Labs: Recent Labs    05/15/24 1350 05/15/24 2037 05/16/24 0415  HGB 14.7  --  13.2  HCT 43.6  --  39.5  PLT 317  --  272  APTT  --  28  --   LABPROT  --  16.7*  --   INR  --  1.3*  --   HEPARINUNFRC  --   --  0.34  CREATININE 0.65  --   --     Estimated Creatinine Clearance: 129.7 mL/min (by C-G formula based on SCr of 0.65 mg/dL).   Medical History: Past Medical History:  Diagnosis Date   Abdominal pain affecting pregnancy, antepartum 02/28/2016   Anemia    Anxiety    Aortic atherosclerosis (HCC)    Cholelithiasis    Depression    Dyspnea    ETOH abuse    Family history of SIDS (sudden infant death syndrome)    a.) 3rd child (son) died at 42 months of age due to SIDS   Gallstones 2018   HTN (hypertension)    Incisional ventral hernia w obstruction    Mobile cecum    Obesity 09/11/2013   Schizophrenia (HCC)    Small bowel obstruction (HCC)    Tobacco abuse 09/11/2013    Assessment: 38 yo female admitted with abdominal pain and nausea. 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  Pharmacy has been consulted for heparin  infusion dosing and monitoring for VTE treatment.   Goal of Therapy:  Heparin  level 0.3-0.7 units/ml Monitor platelets by anticoagulation protocol: Yes  8/08 0415 HL 0.34, therapeutic x 1   Plan:  Continue heparin  infusion at 1400 units/hr Recheck anti-Xa level in 6 hours to confirm Continue to monitor H&H and platelets daily  Rankin CANDIE Dills, PharmD, [facility] 05/16/2024 4:58 AM

## 2024-05-16 NOTE — Consult Note (Signed)
 Hospital Consult    Reason for Consult:  Right Renal Infarcts.  Requesting Physician:  Dr Caleb Exon MD  MRN #:  [id number]  History of Present Illness: This is a 38 y.o. female with medical history significant of cholelithiasis, mobile cecum, SBO, s/p of ventral hernia repair, HTN, depression with anxiety, psychosis, schizophrenia, tobacco abuse, alcohol use, who presents with nausea, vomiting, abdominal pain. Complains of abdominal pain mainly located in the right lower quadrant, constant, aching, severe, radiating to the back, associate with nausea and multiple episode of nonbilious nonbloody vomiting.   Upon workup patient underwent CT scan of the abdomen and pelvis reveals right renal infarcts. She has no bowel obstruction on scan.  Vascular Surgery consulted to evaluate.   Past Medical History:  Diagnosis Date   Abdominal pain affecting pregnancy, antepartum 02/28/2016   Anemia    Anxiety    Aortic atherosclerosis (HCC)    Cholelithiasis    Depression    Dyspnea    ETOH abuse    Family history of SIDS (sudden infant death syndrome)    a.) 3rd child (son) died at 75 months of age due to SIDS   Gallstones 2018   HTN (hypertension)    Incisional ventral hernia w obstruction    Mobile cecum    Obesity 09/11/2013   Schizophrenia (HCC)    Small bowel obstruction (HCC)    Tobacco abuse 09/11/2013    Past Surgical History:  Procedure Laterality Date   CESAREAN SECTION N/A 06/12/2016   Procedure: STAT CESAREAN SECTION WITH BILATERAL STERILIZATION;  Surgeon: Debby JINNY Dinsmore, MD;  Location: [facility] ORS;  Service: Obstetrics;  Laterality: N/A;  Baby Girl @ 2246 Weight 5 lb 10 oz Apgars 8/9   ENDOSCOPIC RETROGRADE CHOLANGIOPANCREATOGRAPHY (ERCP) WITH PROPOFOL  N/A 06/06/2017   Procedure: ENDOSCOPIC RETROGRADE CHOLANGIOPANCREATOGRAPHY (ERCP) WITH PROPOFOL ;  Surgeon: Jinny Carmine, MD;  Location: [facility];  Service: Endoscopy;  Laterality: N/A;   ERCP N/A 10/30/2017   Procedure:  ENDOSCOPIC RETROGRADE CHOLANGIOPANCREATOGRAPHY (ERCP) STENT REMOVAL;  Surgeon: Jinny Carmine, MD;  Location: [facility] ENDOSCOPY;  Service: Endoscopy;  Laterality: N/A;   INCISIONAL HERNIA REPAIR N/A 05/07/2017   Procedure: LAPAROSCOPIC INCISIONAL HERNIA;  Surgeon: Desiderio Schanz, MD;  Location: [facility] ORS;  Service: General;  Laterality: N/A;   TUBAL LIGATION     VENTRAL HERNIA REPAIR N/A 06/19/2016   Procedure: HERNIA REPAIR VENTRAL ADULT;  Surgeon: Charlie FORBES Fell, MD;  Location: [facility] ORS;  Service: General;  Laterality: N/A;    Allergies  Allergen Reactions   Amoxicillin     Vicodin [Hydrocodone -Acetaminophen ]     Prior to Admission medications   Medication Sig Start Date End Date Taking? Authorizing Provider  dicyclomine  (BENTYL ) 20 MG tablet Take 1 tablet (20 mg total) by mouth every 8 (eight) hours as needed. 04/21/24   Ward, Josette SAILOR, DO  metroNIDAZOLE  (FLAGYL ) 500 MG tablet Take 2 tabs (1000mg  total) at 8am, again at 2pm, and again at 8pm the day prior to surgery 04/28/24   Desiderio Schanz, MD  neomycin  (MYCIFRADIN ) 500 MG tablet Take 2 tabs (1000mg  total) at 8am, again at 2pm, and again at 8pm the day prior to surgery 04/28/24   Desiderio Schanz, MD  ondansetron  (ZOFRAN -ODT) 4 MG disintegrating tablet Take 1 tablet (4 mg total) by mouth every 6 (six) hours as needed for nausea or vomiting. 04/21/24   Ward, Josette SAILOR, DO  simethicone  (MYLICON) 80 MG chewable tablet Chew 1 tablet (80 mg total) by mouth every 6 (six) hours as needed (abdominal pain,  gas). 04/21/24   Ward, Josette SAILOR, DO  traZODone  (DESYREL ) 50 MG tablet Take 1 tablet (50 mg total) by mouth at bedtime as needed for sleep. 09/04/19   Kendall Cathlyn Collum, MD    Social History   Socioeconomic History   Marital status: Single    Spouse name: Not on file   Number of children: Not on file   Years of education: Not on file   Highest education level: Not on file  Occupational History   Not on file  Tobacco Use   Smoking status: Every Day     Current packs/day: 1.50    Average packs/day: 1.5 packs/day for 10.0 years (15.0 ttl pk-yrs)    Types: Cigarettes   Smokeless tobacco: Never  Vaping Use   Vaping status: Never Used  Substance and Sexual Activity   Alcohol use: Yes    Alcohol/week: 7.0 standard drinks of alcohol    Types: 7 Cans of beer per week    Comment: 2 40oz/day   Drug use: Yes    Comment: pills   Sexual activity: Never  Other Topics Concern   Not on file  Social History Narrative   Not on file   Social Drivers of Health   Financial Resource Strain: Not on file  Food Insecurity: No Food Insecurity (05/16/2024)   Hunger Vital Sign    Worried About Running Out of Food in the Last Year: Never true    Ran Out of Food in the Last Year: Never true  Transportation Needs: Not on file  Physical Activity: Not on file  Stress: Not on file  Social Connections: Not on file  Intimate Partner Violence: Not on file     Family History  Problem Relation Age of Onset   Diabetes Mother    Asthma Sister    Hypertension Maternal Aunt    Hypertension Maternal Uncle     ROS: Otherwise negative unless mentioned in HPI  Physical Examination  Vitals:   05/15/24 2110 05/16/24 0418  BP: (!) 153/60 124/69  Pulse: 80 81  Resp: 18 14  Temp: 99.2 F (37.3 C) 98.1 F (36.7 C)  SpO2: 96% 98%   Body mass index is 49.61 kg/m.  General:  WDWN in NAD Gait: Not observed HENT: WNL, normocephalic Pulmonary: normal non-labored breathing, without Rales, rhonchi,  wheezing Cardiac: regular, without  Murmurs, rubs or gallops; without carotid bruits Abdomen: Positive bowel sounds, soft, NT/ND, no masses Skin: without rashes Vascular Exam/Pulses: Palpable Pulses throughout, all extremities are warm  Extremities: without ischemic changes, without Gangrene , without cellulitis; without open wounds;  Musculoskeletal: no muscle wasting or atrophy  Neurologic: A&O X 3;  No focal weakness or paresthesias are detected; speech  is fluent/normal Psychiatric:  The pt has Normal affect. Lymph:  Unremarkable  CBC    Component Value Date/Time   WBC 10.3 05/16/2024 0415   RBC 4.65 05/16/2024 0415   HGB 13.2 05/16/2024 0415   HGB 10.8 (L) 04/02/2012 0607   HCT 39.5 05/16/2024 0415   HCT 34.5 (L) 04/01/2012 1057   PLT 272 05/16/2024 0415   PLT 202 04/01/2012 1057   MCV 84.9 05/16/2024 0415   MCV 89 04/01/2012 1057   MCH 28.4 05/16/2024 0415   MCHC 33.4 05/16/2024 0415   RDW 13.2 05/16/2024 0415   RDW 14.7 (H) 04/01/2012 1057   LYMPHSABS 2.8 04/21/2024 0116   LYMPHSABS 2.4 04/01/2012 1057   MONOABS 0.8 04/21/2024 0116   MONOABS 0.7 04/01/2012 1057   EOSABS  0.3 04/21/2024 0116   EOSABS 0.1 04/01/2012 1057   BASOSABS 0.1 04/21/2024 0116   BASOSABS 0.1 04/01/2012 1057    BMET    Component Value Date/Time   NA 136 05/16/2024 0415   K 3.8 05/16/2024 0415   CL 102 05/16/2024 0415   CO2 25 05/16/2024 0415   GLUCOSE 112 (H) 05/16/2024 0415   BUN <5 (L) 05/16/2024 0415   CREATININE 0.62 05/16/2024 0415   CALCIUM 8.7 (L) 05/16/2024 0415   GFRNONAA >60 05/16/2024 0415   GFRAA >60 10/13/2019 1842    COAGS: Lab Results  Component Value Date   INR 1.3 (H) 05/15/2024     Non-Invasive Vascular Imaging:   EXAM: CT ABDOMEN AND PELVIS WITH CONTRAST   TECHNIQUE: Multidetector CT imaging of the abdomen and pelvis was performed using the standard protocol following bolus administration of intravenous contrast.   RADIATION DOSE REDUCTION: This exam was performed according to the departmental dose-optimization program which includes automated exposure control, adjustment of the mA and/or kV according to patient size and/or use of iterative reconstruction technique.   CONTRAST:  OMNIPAQUE  IOHEXOL  300 MG/ML  SOLN   COMPARISON:  CT abdomen pelvis dated 04/21/2024.   FINDINGS: Lower chest: The visualized lung bases are clear.   No intra-abdominal free air or free fluid.   Hepatobiliary: Apparent  fatty liver. No biliary dilatation. The gallbladder appears contracted. No calcified gallstone.   Pancreas: Unremarkable. No pancreatic ductal dilatation or surrounding inflammatory changes.   Spleen: Normal in size without focal abnormality.   Adrenals/Urinary Tract: The adrenal glands are unremarkable. Areas of which shaped hypoperfusion involving the right kidney consistent with infarcts. Pyelonephritis is less likely. Clinical correlation recommended. There is no hydronephrosis on either side. The visualized ureters and urinary bladder appear unremarkable.   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 The cecum is mildly distended. No mechanical obstruction or twisting. There is no bowel obstruction or active inflammation. The appendix is normal.   Vascular/Lymphatic: Mild atherosclerotic calcification of the abdominal aorta. The IVC is unremarkable. There is gas. There is no adenopathy.   Reproductive: The uterus is grossly unremarkable. No suspicious adnexal masses.   Other: Midline anterior pelvic wall incisional scar and hernia repair mesh. Abutment of loops of small bowel to the anterior pelvic mesh suggestive of adhesions.   Musculoskeletal: No acute or significant osseous findings.   IMPRESSION: 1. Right renal infarcts. 2. No bowel obstruction. Normal appendix. 3.  Aortic Atherosclerosis (ICD10-I70.0).  Statin:  No. Beta Blocker:  No. Aspirin:  No. ACEI:  No. ARB:  No. CCB use:  No Other antiplatelets/anticoagulants:  No.    ASSESSMENT/PLAN: This is a 38 y.o. female who presents to [facility] emergency department with complaints of abdominal pain mainly located in the right lower quadrant, pain is described as constant aching and severe radiating to her back.  It is also associated with nausea and multiple episodes of nonbilious nonbloody vomiting.  Patient underwent workup which included CT scan of the  abdomen pelvis which reveals possible right renal infarct.  She has no bowel obstruction on scan today.  After reviewing CT scan with Dr. Selinda Gu MD vascular surgery recommends patient be anticoagulated with Eliquis  10 mg twice daily for 7 days then decrease dosage to 5 mg twice daily.  No procedure is needed at this time as there is no evidence of embolization or thrombus within the renal arteries or renal veins.  Vascular  surgery also recommends a thoracic echocardiogram to help rule out source of embolism thus the cause of her renal infarct.   -I discussed the case in detail with Dr. Selinda Gu MD and he agrees with the plan.   Gwendlyn JONELLE Shank [facility] 05/16/2024 7:32 AM

## 2024-05-17 ENCOUNTER — Other Ambulatory Visit (HOSPITAL_COMMUNITY): Payer: Self-pay

## 2024-05-17 DIAGNOSIS — N280 Ischemia and infarction of kidney: Secondary | ICD-10-CM | POA: Diagnosis not present

## 2024-05-17 LAB — ECHOCARDIOGRAM COMPLETE
AR max vel: 2.41 cm2
AV Peak grad: 9.7 mmHg
Ao pk vel: 1.56 m/s
Area-P 1/2: 4.63 cm2
S' Lateral: 3.4 cm
Weight: 4624 [oz_av]

## 2024-05-17 MED ORDER — METHOCARBAMOL 750 MG PO TABS: 750.0000 mg | ORAL_TABLET | Freq: Four times a day (QID) | ORAL | 0 refills | Status: AC

## 2024-05-17 MED ORDER — TRAMADOL HCL 50 MG PO TABS: 50.0000 mg | ORAL_TABLET | Freq: Four times a day (QID) | ORAL | 0 refills | Status: AC | PRN

## 2024-05-17 NOTE — Plan of Care (Signed)

## 2024-05-17 NOTE — Discharge Summary (Signed)
 Physician Discharge Summary  [name] FMW:[id number] DOB: 07/21/1986 DOA: 05/15/2024  PCP: [facility]  Admit date: 05/15/2024 Discharge date: 05/17/2024  Admitted From: Home Disposition:  Home  Recommendations for Outpatient Follow-up:  Follow up with PCP in 1-2 weeks   Home Health:No Equipment/Devices:None   Discharge Condition:Stable  CODE STATUS:FULL  Diet recommendation: Reg  Brief/Interim Summary:   38 y.o. female with medical history significant of cholelithiasis, mobile cecum, SBO, s/p of ventral hernia repair, HTN, depression with anxiety, psychosis, schizophrenia, tobacco abuse, alcohol use, who presents with nausea, vomiting, abdominal pain.   Patient states that she has nausea, vomiting, abdominal pain for more than 3 days.  Her abdominal pain is mainly located in the right lower quadrant, constant, aching, severe, radiating to the back, associate with nausea and multiple episode of nonbilious nonbloody vomiting.  No diarrhea.  She states that she had lightheadedness and blurry vision earlier, which has resolved.  No unilateral numbness or tingling in extremities, no facial droop or slurred speech.  No fever or chills.  Patient states that she has increased urinary frequency sometimes, denies dysuria or burning with urination.  No chest pain, cough, SOB.  No recent fall or head injury.  No rectal bleeding dark stool.  Patient also complains of left hip pain radiating to the left groin area.   Discharge Diagnoses:  Principal Problem:   Renal infarction [facility]) Active Problems:   Acute pyelonephritis   Abnormal liver function   HTN (hypertension)   Hypokalemia   Metabolic acidosis   Tobacco abuse   ETOH abuse   Mobile cecum   Cholelithiasis   Schizophrenia (HCC)   Depression with anxiety   Obesity, Class III, BMI 40-49.9 (morbid obesity)   Renal infarction [facility]) EDP consulted with Dr. Marea of [facility]. Etiology of renal infarcts is unclear.    Vascular surgery recommends Eliquis  for anticoagulation.   Recommend 64-month course. Prescribed on discharge 2D echocardiogram reassuring Plan: DC home.  14-month Eliquis  prescribed.  Possible acute pyelonephritis, ruled out Urinalysis inconsistent with infection No antibiotics indicated  Discharge Instructions  Discharge Instructions     Diet - low sodium heart healthy   Complete by: As directed    Increase activity slowly   Complete by: As directed       Allergies as of 05/17/2024       Reactions   Amoxicillin     Vicodin [hydrocodone -acetaminophen ]         Medication List     STOP taking these medications    traZODone  50 MG tablet Commonly known as: DESYREL        TAKE these medications    dicyclomine  20 MG tablet Commonly known as: BENTYL  Take 1 tablet (20 mg total) by mouth every 8 (eight) hours as needed.   Eliquis  5 MG Tabs tablet Generic drug: apixaban  Take 2 tablets (10 mg total) by mouth 2 (two) times daily for 6 days, THEN 1 tablet (5 mg total) 2 (two) times daily. Start taking on: May 16, 2024   methocarbamol  750 MG tablet Commonly known as: ROBAXIN  Take 1 tablet (750 mg total) by mouth 4 (four) times daily for 14 days.   metroNIDAZOLE  500 MG tablet Commonly known as: FLAGYL  Take 2 tabs (1000mg  total) at 8am, again at 2pm, and again at 8pm the day prior to surgery   neomycin  500 MG tablet Commonly known as: MYCIFRADIN  Take 2 tabs (1000mg  total) at 8am, again at 2pm, and again at 8pm the day prior to  surgery   ondansetron  4 MG disintegrating tablet Commonly known as: ZOFRAN -ODT Take 1 tablet (4 mg total) by mouth every 6 (six) hours as needed for nausea or vomiting.   simethicone  80 MG chewable tablet Commonly known as: MYLICON Chew 1 tablet (80 mg total) by mouth every 6 (six) hours as needed (abdominal pain, gas).   traMADol  50 MG tablet Commonly known as: ULTRAM  Take 1 tablet (50 mg total) by mouth every 6 (six) hours as needed  for moderate pain (pain score 4-6).        Allergies  Allergen Reactions   Amoxicillin     Vicodin [Hydrocodone -Acetaminophen ]     Consultations: Vascular surgery   Procedures/Studies: ECHOCARDIOGRAM COMPLETE Result Date: 05/17/2024    ECHOCARDIOGRAM REPORT   Patient Name:   [name] Date of Exam: 05/16/2024 Medical Rec #:  [id number]        Height:       64.0 in Accession #:    [id number]       Weight:       289.0 lb Date of Birth:  09/14/1986        BSA:          2.288 m Patient Age:    37 years         BP:           126/60 mmHg Patient Gender: F                HR:           87 bpm. Exam Location:  [facility] Procedure: 2D Echo, Cardiac Doppler and Color Doppler (Both Spectral and Color            Flow Doppler were utilized during procedure). Indications:     Renal Infarct  History:         Patient has no prior history of Echocardiogram examinations.                  Risk Factors:Hypertension and Current Smoker.  Sonographer:     Thea Norlander RCS Referring Phys:  [id number] [name] Diagnosing Phys: Evalene Lunger MD IMPRESSIONS  1. Left ventricular ejection fraction, by estimation, is 60 to 65%. Left ventricular ejection fraction by PLAX is 56 %. The left ventricle has normal function. The left ventricle has no regional wall motion abnormalities. Left ventricular diastolic parameters were normal.  2. Right ventricular systolic function is normal. The right ventricular size is normal.  3. The mitral valve is normal in structure. No evidence of mitral valve regurgitation. No evidence of mitral stenosis.  4. The aortic valve is normal in structure. Aortic valve regurgitation is not visualized. No aortic stenosis is present.  5. The inferior vena cava is normal in size with greater than 50% respiratory variability, suggesting right atrial pressure of 3 mmHg. FINDINGS  Left Ventricle: Left ventricular ejection fraction, by estimation, is 60 to 65%. Left ventricular ejection fraction by PLAX  is 56 %. The left ventricle has normal function. The left ventricle has no regional wall motion abnormalities. Strain was performed and the global longitudinal strain is indeterminate. The left ventricular internal cavity size was normal in size. There is no left ventricular hypertrophy. Left ventricular diastolic parameters were normal. Right Ventricle: The right ventricular size is normal. No increase in right ventricular wall thickness. Right ventricular systolic function is normal. Left Atrium: Left atrial size was normal in size. Right Atrium: Right atrial size was normal in size. Pericardium: There is  no evidence of pericardial effusion. Mitral Valve: The mitral valve is normal in structure. No evidence of mitral valve regurgitation. No evidence of mitral valve stenosis. Tricuspid Valve: The tricuspid valve is normal in structure. Tricuspid valve regurgitation is not demonstrated. No evidence of tricuspid stenosis. Aortic Valve: The aortic valve is normal in structure. Aortic valve regurgitation is not visualized. No aortic stenosis is present. Aortic valve peak gradient measures 9.7 mmHg. Pulmonic Valve: The pulmonic valve was normal in structure. Pulmonic valve regurgitation is not visualized. No evidence of pulmonic stenosis. Aorta: The aortic root is normal in size and structure. Venous: The inferior vena cava is normal in size with greater than 50% respiratory variability, suggesting right atrial pressure of 3 mmHg. IAS/Shunts: No atrial level shunt detected by color flow Doppler. Additional Comments: 3D was performed not requiring image post processing on an independent workstation and was indeterminate.  LEFT VENTRICLE PLAX 2D LV EF:         Left            Diastology                ventricular     LV e' medial:    11.20 cm/s                ejection        LV E/e' medial:  8.8                fraction by     LV e' lateral:   11.20 cm/s                PLAX is 56      LV E/e' lateral: 8.8                %.  LVIDd:         4.80 cm LVIDs:         3.40 cm LV PW:         1.20 cm LV IVS:        1.00 cm LVOT diam:     2.20 cm LV SV:         63 LV SV Index:   28 LVOT Area:     3.80 cm  RIGHT VENTRICLE            IVC RV S prime:     6.31 cm/s  IVC diam: 1.80 cm LEFT ATRIUM           Index LA diam:      3.50 cm 1.53 cm/m LA Vol (A4C): 21.8 ml 9.53 ml/m  AORTIC VALVE AV Area (Vmax): 2.41 cm AV Vmax:        156.00 cm/s AV Peak Grad:   9.7 mmHg LVOT Vmax:      99.00 cm/s LVOT Vmean:     61.800 cm/s LVOT VTI:       0.166 m  AORTA Ao Root diam: 3.20 cm Ao Asc diam:  3.50 cm MITRAL VALVE               TRICUSPID VALVE MV Area (PHT): 4.63 cm    TR Peak grad:   17.5 mmHg MV Decel Time: 164 msec    TR Vmax:        209.00 cm/s MV E velocity: 98.00 cm/s MV A velocity: 62.90 cm/s  SHUNTS MV E/A ratio:  1.56        Systemic VTI:  0.17 m  Systemic Diam: 2.20 cm Evalene Lunger MD Electronically signed by Evalene Lunger MD Signature Date/Time: 05/17/2024/9:09:01 AM    Final    DG HIP UNILAT WITH PELVIS 2-3 VIEWS LEFT Result Date: 05/16/2024 CLINICAL DATA:  Left hip pain.  No injury. 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 No acute fracture or dislocation. No evidence of pelvic fracture. Remainder of the exam is unchanged. IMPRESSION: 1. No acute findings. 2. 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 Electronically Signed   By: Toribio Agreste M.D.   On: 05/16/2024 08:44   CT ABDOMEN PELVIS W CONTRAST Result Date: 05/15/2024 CLINICAL DATA:  Abdominal pain. EXAM: CT ABDOMEN AND PELVIS WITH CONTRAST TECHNIQUE: Multidetector CT imaging of the abdomen and pelvis was performed using the standard protocol following bolus administration of intravenous contrast. RADIATION DOSE REDUCTION: This exam was performed according to the departmental dose-optimization program which includes automated exposure control,  adjustment of the mA and/or kV according to patient size and/or use of iterative reconstruction technique. CONTRAST:  OMNIPAQUE  IOHEXOL  300 MG/ML  SOLN COMPARISON:  CT abdomen pelvis dated 04/21/2024. FINDINGS: Lower chest: The visualized lung bases are clear. No intra-abdominal free air or free fluid. Hepatobiliary: Apparent fatty liver. No biliary dilatation. The gallbladder appears contracted. No calcified gallstone. Pancreas: Unremarkable. No pancreatic ductal dilatation or surrounding inflammatory changes. Spleen: Normal in size without focal abnormality. Adrenals/Urinary Tract: The adrenal glands are unremarkable. Areas of which shaped hypoperfusion involving the right kidney consistent with infarcts. Pyelonephritis is less likely. Clinical correlation recommended. There is no hydronephrosis on either side. The visualized ureters and urinary bladder appear unremarkable. 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 The cecum is mildly distended. No mechanical obstruction or twisting. There is no bowel obstruction or active inflammation. The appendix is normal. Vascular/Lymphatic: Mild atherosclerotic calcification of the abdominal aorta. The IVC is unremarkable. There is gas. There is no adenopathy. Reproductive: The uterus is grossly unremarkable. No suspicious adnexal masses. Other: Midline anterior pelvic wall incisional scar and hernia repair mesh. Abutment of loops of small bowel to the anterior pelvic mesh suggestive of adhesions. Musculoskeletal: No acute or significant osseous findings. IMPRESSION: 1. Right renal infarcts. 2. No bowel obstruction. Normal appendix. 3.  Aortic Atherosclerosis (ICD10-I70.0). Electronically Signed   By: Vanetta Chou M.D.   On: 05/15/2024 18:38   CT ABDOMEN PELVIS W CONTRAST Result Date: 04/21/2024 CLINICAL DATA:  Abdominal pain.  History of ventral hernia repair. EXAM: CT ABDOMEN AND PELVIS WITH  CONTRAST TECHNIQUE: Multidetector CT imaging of the abdomen and pelvis was performed using the standard protocol following bolus administration of intravenous contrast. RADIATION DOSE REDUCTION: This exam was performed according to the departmental dose-optimization program which includes automated exposure control, adjustment of the mA and/or kV according to patient size and/or use of iterative reconstruction technique. CONTRAST:  OMNIPAQUE  IOHEXOL  300 MG/ML  SOLN COMPARISON:  CT with IV and oral contrast 06/05/2017. FINDINGS: Lower chest: There is artifact due to respiratory motion. There is mosaicism in the lower lung fields which could be motion artifact or air trapping with small airways disease. No focal consolidation is seen.  The cardiac size is normal. Hepatobiliary: The liver is 18 cm length mildly steatotic. There is loss of fine detail due to respiratory motion. No obvious mass is evident. Gallbladder is absent, with no biliary dilatation. Pancreas: No abnormality. Spleen:  No abnormality.  No splenomegaly. Adrenals/Urinary Tract: Adrenal glands are unremarkable. Kidneys are normal, without renal calculi, focal lesion, or hydronephrosis. Bladder is unremarkable when allowing for partial distention. Stomach/Bowel: Unremarkable gastric wall. Normal caliber unopacified small bowel. Normal appendix. There is a mobile cecum. Today the cecum is dilated up to 7.2 cm and located in the anterior upper abdomen, above the transverse colon with a portion in between the liver and abdominal wall. There is no definite twisting of the cecum, but this does predispose to cecal volvulus. There is no colonic wall thickening or pneumatosis. Rest of the colon is normal in location. Scattered uncomplicated sigmoid diverticulosis. Vascular/Lymphatic: Age advanced aortic atherosclerosis. No enlarged abdominal or pelvic lymph nodes. Reproductive: Uterus and bilateral adnexa are unremarkable. Other: There is a large  abdominopelvic ventral hernia repair. There is a mild outward protrusion in the midline along the pelvic wall repair, but no incarcerated bowel although at least 2 bowel loops do extend into the protrusion. There is no evidence of failure of the hernia repair. There is no free fluid, free hemorrhage or focal inflammatory process. Musculoskeletal: No acute or significant osseous findings. IMPRESSION: 1. Mobile cecum which is dilated up to 7.2 cm and located in the anterior upper abdomen, above the transverse colon with a portion in between the liver and abdominal wall. There is no definite twisting of the cecum, but this does predispose to cecal volvulus. 2. No colonic wall thickening or pneumatosis. 3. Large abdominopelvic ventral hernia repair with mild outward protrusion in the midline along the pelvic wall repair, but no incarcerated bowel although at least 2 bowel loops do extend into the protrusion. 4. Age advanced aortic atherosclerosis. 5. Mosaic attenuation in the lower lung fields which could be motion artifact or air trapping with small airways disease. 6. Mild hepatic steatosis. Aortic Atherosclerosis (ICD10-I70.0). Electronically Signed   By: Francis Quam M.D.   On: 04/21/2024 02:53      Subjective: Seen and examined the day of discharge.  Pain improved.  Main complaint is soreness in lower extremities.  Discharge Exam: Vitals:   05/17/24 0746 05/17/24 0826  BP: 124/80   Pulse: 97   Resp: 16   Temp: 98.9 F (37.2 C)   SpO2: (!) 88% 97%   Vitals:   05/17/24 0403 05/17/24 0746 05/17/24 0826 05/17/24 0849  BP: 135/66 124/80    Pulse: 98 97    Resp: 16 16    Temp: 99.3 F (37.4 C) 98.9 F (37.2 C)    TempSrc: Oral Oral    SpO2: 94% (!) 88% 97%   Weight:      Height:    5' 4 (1.626 m)    General: Pt is alert, awake, not in acute distress Cardiovascular: RRR, S1/S2 +, no rubs, no gallops Respiratory: CTA bilaterally, no wheezing, no rhonchi Abdominal: Soft, NT, ND, bowel  sounds + Extremities: no edema, no cyanosis    The results of significant diagnostics from this hospitalization (including imaging, microbiology, ancillary and laboratory) are listed below for reference.     Microbiology: Recent Results (from the past 240 hours)  Urine Culture     Status: Abnormal   Collection Time: 05/15/24  3:54 PM   Specimen: Urine, Clean Catch  Result Value Ref Range Status   Specimen Description   Final    URINE, CLEAN CATCH Performed at [facility], [address], [place], [place] [address]    Special Requests   Final    NONE Performed at  [facility], [address], [place], [place] [address]    Culture MULTIPLE SPECIES PRESENT, SUGGEST RECOLLECTION (A)  Final   Report Status 05/16/2024 FINAL  Final     Labs: BNP (last 3 results) No results for input(s): BNP in the last 8760 hours. Basic Metabolic Panel: Recent Labs  Lab 05/15/24 1350 05/16/24 0415  NA 137 136  K 3.4* 3.8  CL 102 102  CO2 16* 25  GLUCOSE 136* 112*  BUN <5* <5*  CREATININE 0.65 0.62  CALCIUM 9.1 8.7*  MG 1.9  --   PHOS 4.5  --    Liver Function Tests: Recent Labs  Lab 05/15/24 1350 05/16/24 0415  AST 207* 190*  ALT 92* 122*  ALKPHOS 56 55  BILITOT 0.9 1.1  PROT 7.8 7.3  ALBUMIN 3.7 3.3*   Recent Labs  Lab 05/15/24 1350  LIPASE 25   No results for input(s): AMMONIA in the last 168 hours. CBC: Recent Labs  Lab 05/15/24 1350 05/16/24 0415  WBC 10.3 10.3  HGB 14.7 13.2  HCT 43.6 39.5  MCV 83.5 84.9  PLT 317 272   Cardiac Enzymes: No results for input(s): CKTOTAL, CKMB, CKMBINDEX, TROPONINI in the last 168 hours. BNP: Invalid input(s): POCBNP CBG: Recent Labs  Lab 05/16/24 0928  GLUCAP 112*   D-Dimer No results for input(s): DDIMER in the last 72 hours. Hgb A1c No results for input(s): HGBA1C in the last 72 hours. Lipid Profile No results for input(s): CHOL, HDL, LDLCALC, TRIG, CHOLHDL,  LDLDIRECT in the last 72 hours. Thyroid function studies No results for input(s): TSH, T4TOTAL, T3FREE, THYROIDAB in the last 72 hours.  Invalid input(s): FREET3 Anemia work up No results for input(s): VITAMINB12, FOLATE, FERRITIN, TIBC, IRON, RETICCTPCT in the last 72 hours. Urinalysis    Component Value Date/Time   COLORURINE AMBER (A) 05/15/2024 1554   APPEARANCEUR CLOUDY (A) 05/15/2024 1554   APPEARANCEUR Hazy 03/08/2012 0910   LABSPEC 1.013 05/15/2024 1554   LABSPEC 1.006 03/08/2012 0910   PHURINE 5.0 05/15/2024 1554   GLUCOSEU NEGATIVE 05/15/2024 1554   GLUCOSEU Negative 03/08/2012 0910   HGBUR NEGATIVE 05/15/2024 1554   BILIRUBINUR NEGATIVE 05/15/2024 1554   BILIRUBINUR Negative 03/08/2012 0910   KETONESUR 5 (A) 05/15/2024 1554   PROTEINUR 30 (A) 05/15/2024 1554   NITRITE NEGATIVE 05/15/2024 1554   LEUKOCYTESUR NEGATIVE 05/15/2024 1554   LEUKOCYTESUR 1+ 03/08/2012 0910   Sepsis Labs Recent Labs  Lab 05/15/24 1350 05/16/24 0415  WBC 10.3 10.3   Microbiology Recent Results (from the past 240 hours)  Urine Culture     Status: Abnormal   Collection Time: 05/15/24  3:54 PM   Specimen: Urine, Clean Catch  Result Value Ref Range Status   Specimen Description   Final    URINE, CLEAN CATCH Performed at [facility], [address], [place], [place] [address]    Special Requests   Final    NONE Performed at [facility], [address] [place], [place] [address]    Culture MULTIPLE SPECIES PRESENT, SUGGEST RECOLLECTION (A)  Final   Report Status 05/16/2024 FINAL  Final     Time coordinating discharge: 40 minutes  SIGNED:   Calvin KATHEE Robson, MD  [facility] 05/17/2024, 11:24 AM Pager   If 7PM-7AM, please contact night-coverage

## 2024-05-17 NOTE — TOC Progression Note (Signed)
 Transition of Care [facility]) - Progression Note    Patient Details  Name: [name] MRN: [id number] Date of Birth: 24-Apr-1986  Transition of Care [facility]) CM/SW Contact  [place], [place] Phone Number: 05/17/2024, 11:22 AM  Clinical Narrative:     CSW received a secure chat from RN stating that patient expressed concerns with her finances. Patient has had difficulty paying bills and affording medication. CSW sent financial navigators a secured e-mail asking for patient to receive education/resources.   Resources uploaded to the AVS. Meds to Bed notified of patients concerns.                     Expected Discharge Plan and Services         Expected Discharge Date: 05/17/24                                     Social Drivers of Health (SDOH) Interventions SDOH Screenings   Food Insecurity: No Food Insecurity (05/16/2024)  Housing: Patient Declined (05/16/2024)  Transportation Needs: Unmet Transportation Needs (05/17/2024)  Utilities: At Risk (05/17/2024)  Alcohol Screen: Medium Risk (08/29/2019)  Tobacco Use: High Risk (05/15/2024)    Readmission Risk Interventions     No data to display

## 2024-05-19 ENCOUNTER — Encounter (HOSPITAL_COMMUNITY): Payer: Self-pay | Admitting: Pharmacy Technician

## 2024-05-19 ENCOUNTER — Inpatient Hospital Stay: Admit: 2024-05-19 | Payer: MEDICAID

## 2024-05-19 NOTE — Telephone Encounter (Signed)
 ERROR

## 2024-05-27 ENCOUNTER — Inpatient Hospital Stay: Admit: 2024-05-27 | Payer: MEDICAID | Admitting: Surgery

## 2024-05-27 SURGERY — COLECTOMY, RIGHT
Anesthesia: General

## 2024-08-08 ENCOUNTER — Ambulatory Visit: Payer: MEDICAID | Admitting: Surgery

## 2024-08-27 ENCOUNTER — Ambulatory Visit: Payer: MEDICAID | Admitting: Surgery

## 2024-09-08 ENCOUNTER — Ambulatory Visit: Payer: MEDICAID | Admitting: Surgery
